# Patient Record
Sex: Male | Born: 1955 | Race: White | Hispanic: No | Marital: Married | Smoking: Former smoker
Health system: Southern US, Community
[De-identification: ages and names within clinical notes are randomized; demographics above are authoritative.]

## PROBLEM LIST (undated history)

## (undated) DIAGNOSIS — N400 Enlarged prostate without lower urinary tract symptoms: Secondary | ICD-10-CM

## (undated) DIAGNOSIS — E559 Vitamin D deficiency, unspecified: Secondary | ICD-10-CM

## (undated) DIAGNOSIS — I10 Essential (primary) hypertension: Secondary | ICD-10-CM

## (undated) DIAGNOSIS — Z86010 Personal history of colonic polyps: Secondary | ICD-10-CM

## (undated) DIAGNOSIS — I219 Acute myocardial infarction, unspecified: Secondary | ICD-10-CM

## (undated) DIAGNOSIS — G8929 Other chronic pain: Secondary | ICD-10-CM

## (undated) DIAGNOSIS — IMO0002 Reserved for concepts with insufficient information to code with codable children: Secondary | ICD-10-CM

## (undated) DIAGNOSIS — E785 Hyperlipidemia, unspecified: Secondary | ICD-10-CM

## (undated) DIAGNOSIS — M2540 Effusion, unspecified joint: Secondary | ICD-10-CM

## (undated) DIAGNOSIS — F329 Major depressive disorder, single episode, unspecified: Secondary | ICD-10-CM

## (undated) DIAGNOSIS — M549 Dorsalgia, unspecified: Secondary | ICD-10-CM

## (undated) DIAGNOSIS — F32A Depression, unspecified: Secondary | ICD-10-CM

## (undated) DIAGNOSIS — M2550 Pain in unspecified joint: Secondary | ICD-10-CM

## (undated) DIAGNOSIS — T884XXA Failed or difficult intubation, initial encounter: Secondary | ICD-10-CM

## (undated) DIAGNOSIS — Z87442 Personal history of urinary calculi: Secondary | ICD-10-CM

## (undated) HISTORY — PX: ACHILLES TENDON SURGERY: SHX542

## (undated) HISTORY — PX: ESOPHAGOGASTRODUODENOSCOPY: SHX1529

## (undated) HISTORY — PX: TONSILLECTOMY: SUR1361

## (undated) HISTORY — PX: ELBOW SURGERY: SHX618

## (undated) HISTORY — PX: COLONOSCOPY: SHX174

## (undated) HISTORY — PX: BACK SURGERY: SHX140

---

## 1997-01-06 DIAGNOSIS — I219 Acute myocardial infarction, unspecified: Secondary | ICD-10-CM

## 1997-01-06 HISTORY — DX: Acute myocardial infarction, unspecified: I21.9

## 2003-09-19 ENCOUNTER — Encounter
Admission: RE | Admit: 2003-09-19 | Discharge: 2003-11-15 | Payer: Self-pay | Admitting: Physical Medicine & Rehabilitation

## 2003-09-20 ENCOUNTER — Ambulatory Visit: Payer: Self-pay | Admitting: Physical Medicine & Rehabilitation

## 2003-11-15 ENCOUNTER — Encounter
Admission: RE | Admit: 2003-11-15 | Discharge: 2004-02-13 | Payer: Self-pay | Admitting: Physical Medicine & Rehabilitation

## 2004-01-07 HISTORY — PX: CARPAL TUNNEL RELEASE: SHX101

## 2004-01-12 ENCOUNTER — Ambulatory Visit: Payer: Self-pay | Admitting: Physical Medicine & Rehabilitation

## 2004-02-14 ENCOUNTER — Encounter
Admission: RE | Admit: 2004-02-14 | Discharge: 2004-05-13 | Payer: Self-pay | Admitting: Physical Medicine & Rehabilitation

## 2004-02-14 ENCOUNTER — Ambulatory Visit: Payer: Self-pay | Admitting: Physical Medicine & Rehabilitation

## 2004-05-13 ENCOUNTER — Encounter
Admission: RE | Admit: 2004-05-13 | Discharge: 2004-08-11 | Payer: Self-pay | Admitting: Physical Medicine & Rehabilitation

## 2006-10-30 ENCOUNTER — Encounter: Admission: RE | Admit: 2006-10-30 | Discharge: 2006-10-30 | Payer: Self-pay | Admitting: Orthopaedic Surgery

## 2006-11-13 ENCOUNTER — Encounter: Admission: RE | Admit: 2006-11-13 | Discharge: 2006-11-13 | Payer: Self-pay | Admitting: Orthopaedic Surgery

## 2007-01-29 ENCOUNTER — Encounter: Admission: RE | Admit: 2007-01-29 | Discharge: 2007-01-29 | Payer: Self-pay | Admitting: Orthopaedic Surgery

## 2007-05-14 ENCOUNTER — Ambulatory Visit (HOSPITAL_COMMUNITY): Admission: RE | Admit: 2007-05-14 | Discharge: 2007-05-15 | Payer: Self-pay | Admitting: Neurosurgery

## 2010-01-06 HISTORY — PX: JOINT REPLACEMENT: SHX530

## 2010-01-27 ENCOUNTER — Encounter: Payer: Self-pay | Admitting: Orthopaedic Surgery

## 2010-05-21 NOTE — Op Note (Signed)
[name], [name]               ACCOUNT NO.:  [id number]   MEDICAL RECORD NO.:  [id number]          PATIENT TYPE:  OIB   LOCATION:  3018                         FACILITY:  [facility]   PHYSICIAN:  Payton Doughty, M.D.      DATE OF BIRTH:  08/02/1955   DATE OF PROCEDURE:  05/14/2007  DATE OF DISCHARGE:                               OPERATIVE REPORT   PREOPERATIVE DIAGNOSIS:  Spondylosis and left L3 radiculopathy.   POSTOPERATIVE DIAGNOSIS:  Spondylosis and left L3 radiculopathy.   PROCEDURE:  L2-L3 laminotomy and foraminotomy done on left side.   DICTATING DOCTOR:  Payton Doughty, MD   ANESTHESIA:  General endotracheal.   PREP:  Prepped with alcohol wipe.   COMPLICATIONS:  None.   NURSE ASSISTANT:  Evon Slack   DOCTOR ASSISTANT:  Clydene Fake, MD   BODY TEXT:  This is a 55 year old gentleman with severe lumbar  spondylosis at L2-L3, taken to the operating roomsmoothly anesthetized  and intubated, placed prone on the operating table.  Following shave,  prep, and drape in usual sterile fashion, skin was infiltrated with 1%  lidocaine with 1:400,000 epinephrine.  Skin was incised from the bottom  level 1 to the bottom level 2 and the lamina of L2 was exposed on the  left side in subperiosteal plane.  Marker was placed.  An intraoperative  x-ray obtained and confirmed correct this level.  The marker was placed  over the pars interarticularis of what was believed to be L2.  This was  in fact shown to be the case on the x-ray.  The high-speed drill was  then used to create a semilaminectomy on the left side beyond the bottom  of ligament flavum that was removed in a retrograde fashion.  This  allowed mobilization of the L3 route as it rounded the pedicle.  The  central portion of the canal was also decompressed.  The anterior  epidural space was explored and found to be free.  The wound was  irrigated, hemostasis assured.  Depo-Medrol-soaked pad was placed in the  laminotomy  defect.  Successive layers of 0 Vicryl, 2-0 Vicryl, 3-0  Vicryl, and 3-0 nylon were used to close.  Betadine and Telfa dressing  was applied and made occlusive with OpSite, and the patient returned to  recovery room in good condition.           ______________________________  Payton Doughty, M.D.     MWR/MEDQ  D:  05/14/2007  T:  05/15/2007  Job:  [id number]

## 2010-05-21 NOTE — H&P (Signed)
[name], [name]               ACCOUNT NO.:  [id number]   MEDICAL RECORD NO.:  [id number]          PATIENT TYPE:  OIB   LOCATION:  3018                         FACILITY:  [facility]   PHYSICIAN:  Payton Doughty, M.D.      DATE OF BIRTH:  1955/08/01   DATE OF ADMISSION:  05/14/2007  DATE OF DISCHARGE:                              HISTORY & PHYSICAL   ADMISSION DIAGNOSES:  Spondylosis and spinal stenosis L2-3.   DICTATING DOCTOR:  Payton Doughty, MD.   BODY TEXT:  Mr. [name] is a 55 year old right-handed white gentleman 6-10  weeks since he has noticed increasing left-sided thigh pain and getting  down across top of his knee and not getting below his knee.  Pain is  free for about 10 minutes, gets worse and bothers him at night.  He has  had low back pain with the left leg as a major feature, the right leg  does not affected.   MEDICAL HISTORY:  Remarkable for obesity.  He has noted remarkable drop.  He has lost 160 pounds with diet and exercise.  He has had pneumonia in  the past, but recently had stress SRI.  Takes aspirin, etodolac, Lasix,  Toprol, Lipitor, lisinopril, and oxycodone.   ALLERGIES:  None.   SURGICAL HISTORY:  Carpal tunnel nerve release and a knee operation in  1985.   SOCIAL HISTORY:  He does not smoke or drink and he is a Merchandiser, retail in a  bakery.   FAMILY HISTORY:  Mom died at 72.  Dad died at 46.  Both had  hypertension.   REVIEW OF SYSTEMS:  Marked for glasses, hypertension,  hypercholesterolemia, back pain, and arthritis.  His HEENT exam is in  normal limits.  He has good range of motion of his neck.  Chest is  clear.  Cardiac exam is regular rate and rhythm.  Abdomen is somewhat  large but nontender with no hepatosplenomegaly.  Extremities, no  clubbing or cyanosis.  GU exam is deferred.  Peripheral pulses are good.  Neurologically, he is awake, alert, and oriented.  Cranial nerves are  intact.  Motor exam shows 5/5 strength throughout the upper and lower  extremities.  Sensory dysesthesias described in the left L3  distribution.  Reflexes are 2 at the knees, 1 at the left, and 2 at the  right.  Straight leg raise is negative.  He comes in with an MRI that  shows a spinal stenosis at L2-3 worse off the left.  There is  combination of disk and facet arthropathy.  There is fair amount of  edema in the L2-3 vertebral body.  It is strongly suggestive of  extensive degenerative change.   CLINICAL IMPRESSION:  Lumbar spondylosis and symptomatic neurogenic  claudication in the left L3 distribution.   PLANS:  Left L3 laminotomy and foraminotomy.  The risks and benefits  have been discussed with him and he wished to proceed.           ______________________________  Payton Doughty, M.D.     MWR/MEDQ  D:  05/14/2007  T:  05/15/2007  Job:  [id number]

## 2010-05-24 NOTE — Assessment & Plan Note (Signed)
MEDICAL RECORD NUMBER:  [id number].   DATE OF BIRTH:  [birth year].   The patient returns with complaints of ankle pain bilaterally.  We felt that  these were mostly due to weight and ankle positioning.  He did better with  ASOs and ties these tight, but still does not feel that he gets ultimate  immobilization of the joint with these.  After days of heavy walking or  activity, the following morning comes with a lot of ankle pain.  The patient  continues on his etocolac 400 mg b.i.d.  He has had no GI side effects up to  this point.  I do not believe he has purchased any new shoes since I last  saw him.  He is wearing high ankle boots today.  The patient rates his pain  generally at a 0-2/10 with a 1/10 pain noted generally on average.  The  patient asked questions today regarding other alternatives for his feet.   He told me last weekend that he worked hard in the garden and outside on  Friday and then Saturday woke up and could not put weight on the feet and  ankles due to pain.  It took him the rest of the morning really to get up.  He has had no recent x-rays.  He has never had MRIs of the feet done.  He  has really made no __________ in weight loss since I last saw him either.   SOCIAL HISTORY:  The patient continues to work full time.   REVIEW OF SYSTEMS:  The patient notes shortness of breath and swelling.  Occasional problems with sleep, but denies spasms, dizziness, anxiety,  fever, chills or bleeding.   PHYSICAL EXAMINATION:  The blood pressure is 154/78, the pulse is 78,  respiratory rate 16 and saturating 96% on room air.  The patient walks with  a limp to either side.  His affect is bright and appropriate.  His  appearance is well kept.  The patient continues to display some wear on the  lateral aspects of his soles.  He walks with supination in the feet.  In  addition to the varus deformity of the foot, he has valgus deformities at  the knees bilaterally.  Knee and  ankle strength seem well preserved.  The  patient is morbidly obese.  The pelvis was normal in position and height  essentially.  The heart was regular rate and rhythm.  The lungs were clear.  The abdomen was nontender.   ASSESSMENT:  1.  Bilateral ankle weakness due to supination of the foot and varus      deformity.  2.  Morbid obesity.  3.  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  4.  Left heel cord/calcaneal spur on the left side.   PLAN:  1.  We will send the patient for MRIs of his feet to rule out stress      fracture or any other obvious cause of his activity-related pain.  2.  We are limited in providing another device that will provide him      adequate ankle stabilization.  He seems to do best with his boots and I      would recommend that he continue to wear these plus or minus with the      ASOs.  I do not think that we want to go as far as an AFO due to  restrictions that it would cause.  I do not think that he would benefit      from a cast boot purely from an ankle stabilization standpoint due to      immobilization also.  We would consider a cast boot if there is some      type of stress fracture going on in the feet.  3.  The patient will continue with etocolac for any arthritic effects.  I      did warn him of side effect profile both from a stomach and renal      standpoint.  4.  The patient needs to continue with his shoe maintenance.  5.  The patient needs to get serious about his weight losses because this is      his ultimate problem here.  6.  I will see the patient back in about two months' time.       ZTS/MedQ  D:  11/20/2003 15:09:18  T:  11/20/2003 19:33:23  Job #:  [id number]   cc:   Lurena Joiner L. Jolinda Croak, M.D.  [address] [place]  [place] [address]  Fax: [phone]

## 2010-05-24 NOTE — Assessment & Plan Note (Signed)
MEDICAL RECORD NUMBER:  [id number].   [name] is here in followup of his bilateral foot and ankle pain. MRI  performed on January 4 revealed 2/3 thickness tear in the left Achilles  tendon anterior to posterior at the insertion upon the calculus. The patient  saw Dr. Cleophas Dunker for an orthopedic opinion. He felt that he was not a  surgical candidate at this point. He recommended conservative care at this  time unless symptoms should increase.   I talked to the patient at length about his symptoms, and they certainly  have been long standing. Pain really has not increased significantly over  the last two or three years. Pain is worse when he walks on uneven surfaces  and walks up heels. His pain still is in the range of 1-4/10 and generally  averages at a 2/10. Pain improves with rest, heat, and ice. He finds that  the Ultram and Tylenol help to a certain extent but no significantly when  pain increases.   SOCIAL HISTORY:  The patient continues to work full time. He is trying to  work on his diet. He states he lives nearby the [place].   REVIEW OF SYSTEMS:  The patient reports history of heart trouble but no  other new symptoms from a respiratory, neurological, psychiatric, or  genitourinary standpoint. Full review of systems section is in the health  and history portion of the chart.   PHYSICAL EXAMINATION:  Blood pressure is 133/73, pulse 67, respiratory rate  22. He is saturating 96% on room air. The patient walks with a slight limp  favoring the left side. Affect is bright and appropriate. Appearance is well  kept. His flexible in the left foot remains a bit diminished. He had pain  with palpation along the left calcaneus region near the Achilles insertion.  The patient had pain with forced ankle dorsi flexion as well. Otherwise,  motor and sensory exam was intact. The patient had normal skin and pulses  were intact.   ASSESSMENT:  1.  Bilateral ankle weakness and pain with partial  left Achilles tendon tear      at the insertion  upon the calcaneus. This injury is likely chronic. He      does not appear to be at acute risk for rupture.  2.  Morbid obesity.  3.  Osteoarthritis of the knees.   PLAN:  1.  The patient had good results with the steroid injection in January. We      will hold off on another injection at this point as I do not wish to      weaken the insertion of the tendon on the calcaneus. He still having      good results from the injection.  2.  The patient would like to become active with range of motion and      exercise. He likely will join a gym which I encouraged. We discussed      multiple stretching exercises, and he would benefit from a regular      stretching program to the Achilles tendon on both lower extremities.      Before activities, I recommended heat, stretching, and ice and      stretching afterwards.  3.  I wrote the patient a prescription for Percocet 2.5/325 for breakthrough      pain. He will likely use this sparingly.  4.  I will see him back in about three months' time.      ZTS/MedQ  D:  02/16/2004 12:22:57  T:  02/17/2004 07:14:54  Job #:  [id number]   cc:   [name] M.D. Ellen Henri

## 2010-05-24 NOTE — Assessment & Plan Note (Signed)
DATE OF VISIT:  January 12, 2004.   MEDICAL RECORD NUMBER:  [id number].   DATE OF BIRTH:  08/07/1955.   Mr. [name] is here in followup of his bilateral ankle and foot pain.  We  ordered MRIs at the last visit.  We actually did not get those until  yesterday for whatever reason.  They were done at [facility].  I do not have these available to me at his visit today.  The  patient decreased his etocolac down to 400 mg daily.  He has tried to wear  his boots religiously and it seems to have helped to a certain extent, but  worsens usually with his activity.  He really has not gone forward with any  new shoe wear or adaptations.  He has not lost any weight.  He is still  working full time.  He does wonder if he would benefit from occasion  breakthrough pain medication to help him continue on.  He rates his pain  currently at a 0-1/10.   SOCIAL HISTORY:  The patient is working full time for [employer].   REVIEW OF SYSTEMS:  The patient has a history of some heart attacks, but has  had no new problems recently.  Denies chest pain, wheezing, coughing,  spasms, confusion, problems with sleep, agitation, nausea, vomiting,  diarrhea, constipation or sweating.   PHYSICAL EXAMINATION:  The blood pressure is 136/62, the pulse is 78, the  respiratory rate is 20 and saturating 96% on room air.  The patient walks  wide-based gait.  Affect is bright and appropriate.  Appearance is well  kept.  He remains significantly obese.  We looked the feet today and he does  not have a dramatically flat or high arch.  The bilateral aspect of his foot  does seem to come a bit more medially than the average foot would.  He has  fair flexibility of the foot, although the foot compresses a great deal  under his weight.  The patient had pain with palpation along the posterior  calcaneus near the Achilles' tendon insertion area.  Range of motion was  fair.  The patient had pain with  forced ankle dorsiflexion.  Motor strength  was 5/5 in both lower extremities.  Sensory exam was grossly intact.  The  abdomen was obese.  Pulses were 2+.  The heart was regular rate and rhythm.  The chest was clear.   ASSESSMENT:  1.  Bilateral ankle weakness and pain due to supination of the foot and      varus deformity.  I do question whether that it truly is supinating when      he walks.  He may be rather be pronating the foot to a certain extent      with his significant weight.  2.  Morbid obesity.  3.  Left heel cord/calcaneal spur.  4.  Osteoarthritis of both knees.   PLAN:  1.  After informed consent, we injected the left heel spur/calcaneus with 40      mg of Kenalog and 2 mL of 1% lidocaine.  The patient tolerated this      well.  Post injection instructions were given.  2.  I would like to try a different type of shoe insert with better medial      arch support to see if this in fact improves his pain rather than      increasing it.  He needs to  keep up to date with the shoe as his current      insole was really worn thin today.  3.  Will decrease etodolac for arthritis to once a day if needed.  He was      made aware of some of the side and      complications associated with this type of medicine.  4.  I will see the patient back in about six weeks' time.  I will review his      MRI studies with him as needed.      Zach   ZTS/MedQ  D:  01/12/2004 14:53:44  T:  01/12/2004 17:16:38  Job #:  [id number]   cc:   Lurena Joiner L. Jolinda Croak, M.D.

## 2010-07-09 ENCOUNTER — Emergency Department (HOSPITAL_BASED_OUTPATIENT_CLINIC_OR_DEPARTMENT_OTHER)
Admission: EM | Admit: 2010-07-09 | Discharge: 2010-07-09 | Disposition: A | Discharge status: Home / Self Care | Payer: Worker's Compensation | Attending: Emergency Medicine | Admitting: Emergency Medicine

## 2010-07-09 ENCOUNTER — Emergency Department (INDEPENDENT_AMBULATORY_CARE_PROVIDER_SITE_OTHER): Payer: Worker's Compensation

## 2010-07-09 DIAGNOSIS — W108XXA Fall (on) (from) other stairs and steps, initial encounter: Secondary | ICD-10-CM | POA: Insufficient documentation

## 2010-07-09 DIAGNOSIS — M519 Unspecified thoracic, thoracolumbar and lumbosacral intervertebral disc disorder: Secondary | ICD-10-CM

## 2010-07-09 DIAGNOSIS — Z79899 Other long term (current) drug therapy: Secondary | ICD-10-CM | POA: Insufficient documentation

## 2010-07-09 DIAGNOSIS — I252 Old myocardial infarction: Secondary | ICD-10-CM | POA: Insufficient documentation

## 2010-07-09 DIAGNOSIS — M545 Low back pain, unspecified: Secondary | ICD-10-CM | POA: Insufficient documentation

## 2010-07-09 DIAGNOSIS — Y9289 Other specified places as the place of occurrence of the external cause: Secondary | ICD-10-CM | POA: Insufficient documentation

## 2010-07-09 DIAGNOSIS — E7800 Pure hypercholesterolemia, unspecified: Secondary | ICD-10-CM | POA: Insufficient documentation

## 2010-07-09 DIAGNOSIS — W19XXXA Unspecified fall, initial encounter: Secondary | ICD-10-CM

## 2010-07-09 DIAGNOSIS — G8929 Other chronic pain: Secondary | ICD-10-CM | POA: Insufficient documentation

## 2011-06-14 ENCOUNTER — Encounter (HOSPITAL_BASED_OUTPATIENT_CLINIC_OR_DEPARTMENT_OTHER): Payer: Self-pay | Admitting: Emergency Medicine

## 2011-06-14 ENCOUNTER — Emergency Department (HOSPITAL_BASED_OUTPATIENT_CLINIC_OR_DEPARTMENT_OTHER): Payer: BC Managed Care – PPO

## 2011-06-14 ENCOUNTER — Emergency Department (HOSPITAL_BASED_OUTPATIENT_CLINIC_OR_DEPARTMENT_OTHER)
Admission: EM | Admit: 2011-06-14 | Discharge: 2011-06-14 | Disposition: A | Discharge status: Home / Self Care | Payer: BC Managed Care – PPO | Attending: Emergency Medicine | Admitting: Emergency Medicine

## 2011-06-14 DIAGNOSIS — IMO0002 Reserved for concepts with insufficient information to code with codable children: Secondary | ICD-10-CM | POA: Insufficient documentation

## 2011-06-14 DIAGNOSIS — Z79899 Other long term (current) drug therapy: Secondary | ICD-10-CM | POA: Insufficient documentation

## 2011-06-14 DIAGNOSIS — I10 Essential (primary) hypertension: Secondary | ICD-10-CM | POA: Insufficient documentation

## 2011-06-14 DIAGNOSIS — R110 Nausea: Secondary | ICD-10-CM | POA: Insufficient documentation

## 2011-06-14 DIAGNOSIS — R5381 Other malaise: Secondary | ICD-10-CM | POA: Insufficient documentation

## 2011-06-14 DIAGNOSIS — I2510 Atherosclerotic heart disease of native coronary artery without angina pectoris: Secondary | ICD-10-CM | POA: Insufficient documentation

## 2011-06-14 DIAGNOSIS — R0602 Shortness of breath: Secondary | ICD-10-CM | POA: Insufficient documentation

## 2011-06-14 DIAGNOSIS — R531 Weakness: Secondary | ICD-10-CM

## 2011-06-14 DIAGNOSIS — R61 Generalized hyperhidrosis: Secondary | ICD-10-CM | POA: Insufficient documentation

## 2011-06-14 DIAGNOSIS — I252 Old myocardial infarction: Secondary | ICD-10-CM | POA: Insufficient documentation

## 2011-06-14 HISTORY — DX: Acute myocardial infarction, unspecified: I21.9

## 2011-06-14 HISTORY — DX: Dorsalgia, unspecified: M54.9

## 2011-06-14 HISTORY — DX: Other chronic pain: G89.29

## 2011-06-14 HISTORY — DX: Reserved for concepts with insufficient information to code with codable children: IMO0002

## 2011-06-14 LAB — BASIC METABOLIC PANEL
CO2: 26 mEq/L (ref 19–32)
Calcium: 9.6 mg/dL (ref 8.4–10.5)
GFR calc Af Amer: 77 mL/min — ABNORMAL LOW (ref >90)
Potassium: 4.3 mEq/L (ref 3.5–5.1)

## 2011-06-14 LAB — TROPONIN I
Troponin I: <0.3 ng/mL (ref <0.30)
Troponin I: <0.3 ng/mL (ref <0.30)

## 2011-06-14 LAB — URINALYSIS, ROUTINE W REFLEX MICROSCOPIC
Glucose, UA: NEGATIVE mg/dL
Ketones, ur: NEGATIVE mg/dL
Nitrite: NEGATIVE
Protein, ur: NEGATIVE mg/dL
pH: 7.5 (ref 5.0–8.0)

## 2011-06-14 LAB — CBC
MCH: 30.5 pg (ref 26.0–34.0)
MCHC: 34.5 g/dL (ref 30.0–36.0)
MCV: 88.5 fL (ref 78.0–100.0)
RBC: 5.31 MIL/uL (ref 4.22–5.81)
RDW: 14.3 % (ref 11.5–15.5)

## 2011-06-14 MED ORDER — ASPIRIN 325 MG PO TABS: 325.0000 mg | ORAL_TABLET | ORAL | Status: DC

## 2011-06-14 MED ORDER — SODIUM CHLORIDE 0.9 % IV BOLUS (SEPSIS)
1000.0000 mL | Freq: Once | INTRAVENOUS | Status: AC
  Administered 2011-06-14: 1000 mL via INTRAVENOUS

## 2011-06-14 MED ORDER — NITROGLYCERIN 0.4 MG SL SUBL
0.4000 mg | SUBLINGUAL_TABLET | SUBLINGUAL | Status: DC | PRN
  Filled 2011-06-14: qty 25

## 2011-06-14 NOTE — ED Provider Notes (Signed)
History  This chart was scribed for Cyndra Numbers, MD by Cherlynn Perches. The patient was seen in room MH09/MH09. Patient's care was started at 1535.  CSN: [id number]  Arrival date & time 06/14/11  1535   First MD Initiated Contact with Patient 06/14/11 1643      Chief Complaint  Patient presents with  . Weakness  . Shortness of Breath  . Nausea    (Consider location/radiation/quality/duration/timing/severity/associated sxs/prior treatment) HPI  [name] is a 56 y.o. male with a h/o of HTN, CAD, and MI who presents to the Emergency Department complaining of 2 days of gradually worsening, waxing and waning, moderate, generalized weakness with 1 day of associated diaphoresis, SOB, and nausea. Pt reports that he began feeling weak upon waking up two days ago. Pt states that weakness was improving until today. Today, pt reports that his weakness became worse and his other symptoms began. Pt states that the last time he felt this weak was when he had his last MI. At the time of his last MI, pt also had chest pain described as "pressure," which is not present today. Pt denies chest pain, vomiting, diarrhea, constipation, dysuria, hematuria, numbness, headache, and fever. Pt denies any known ill contacts.  Past Medical History  Diagnosis Date  . Degenerative disk disease   . Coronary artery disease   . Myocardial infarct   . Chronic back pain   . Chronic knee pain   . Morbid obesity     Past Surgical History  Procedure Date  . Revision total knee arthroplasty   . Elbow surgery   . Back surgery     History reviewed. No pertinent family history.  History  Substance Use Topics  . Smoking status: Never Smoker   . Smokeless tobacco: Never Used  . Alcohol Use: No      Review of Systems  Constitutional: Positive for diaphoresis. Negative for fever and chills.  HENT: Negative for ear pain and neck pain.   Eyes: Negative.   Respiratory: Positive for shortness of breath.  Negative for chest tightness.   Cardiovascular: Negative for chest pain.  Gastrointestinal: Positive for nausea. Negative for vomiting, diarrhea and constipation.  Genitourinary: Negative.  Negative for dysuria and hematuria.  Musculoskeletal: Negative.   Skin: Negative.   Neurological: Positive for weakness. Negative for numbness.  Hematological: Negative.   Psychiatric/Behavioral: Negative.   All other systems reviewed and are negative.    Allergies  Review of patient's allergies indicates no known allergies.  Home Medications   Current Outpatient Rx  Name Route Sig Dispense Refill  . ASPIRIN 325 MG PO TABS Oral Take 325 mg by mouth daily.    . ATORVASTATIN CALCIUM 40 MG PO TABS Oral Take 40 mg by mouth daily.    . BUPROPION HCL ER (XL) 300 MG PO TB24 Oral Take 300 mg by mouth daily.    . CHOLINE FENOFIBRATE 135 MG PO CPDR Oral Take 135 mg by mouth daily.    Marland Kitchen LISINOPRIL 20 MG PO TABS Oral Take 20 mg by mouth daily.    Marland Kitchen LORAZEPAM 0.5 MG PO TABS Oral Take 0.5 mg by mouth every 8 (eight) hours.    . NEBIVOLOL HCL 10 MG PO TABS Oral Take 10 mg by mouth daily.    . OXYCODONE HCL ER 10 MG PO TB12 Oral Take 10 mg by mouth every 12 (twelve) hours.    Marland Kitchen TADALAFIL 20 MG PO TABS Oral Take 20 mg by mouth daily as needed.    Marland Kitchen  TRAZODONE HCL 50 MG PO TABS Oral Take 50 mg by mouth at bedtime.    . TRIAMTERENE-HCTZ 75-50 MG PO TABS Oral Take 1 tablet by mouth daily.      BP 138/87  Pulse 72  Temp 99.1 F (37.3 C)  Resp 22  Ht 6\' 2"  (1.88 m)  Wt 383 lb (173.728 kg)  BMI 49.17 kg/m2  SpO2 100%  Physical Exam  Nursing note and vitals reviewed. Constitutional: He is oriented to person, place, and time. He appears well-developed and well-nourished.       Obese  HENT:  Head: Normocephalic and atraumatic.  Eyes: Conjunctivae and EOM are normal. No scleral icterus.  Neck: Normal range of motion. Neck supple. No JVD present.  Cardiovascular: Normal rate and regular rhythm.  Exam reveals  no gallop and no friction rub.   No murmur heard. Pulmonary/Chest: Effort normal and breath sounds normal. No respiratory distress. He has no wheezes. He has no rales.  Abdominal: He exhibits no distension. There is no tenderness. There is no rebound.  Musculoskeletal: Normal range of motion. He exhibits edema (trace peripheral edema bilaterally). He exhibits no tenderness.  Neurological: He is oriented to person, place, and time. Coordination normal.  Skin: No rash noted. No erythema.  Psychiatric: He has a normal mood and affect. His behavior is normal.    ED Course  Procedures (including critical care time)   Date: 06/14/2011  Rate: 73  Rhythm: normal sinus rhythm  QRS Axis: normal  Intervals: normal  ST/T Wave abnormalities: normal  Conduction Disutrbances:none  Narrative Interpretation:   Old EKG Reviewed: none available   DIAGNOSTIC STUDIES: Oxygen Saturation is 100% on O2, normal by my interpretation.    COORDINATION OF CARE: 4:50PM - Will give fluids for dehydration. Will also re-run labs after 3 hours. Pt agrees with plan.    Results for orders placed during the hospital encounter of 06/14/11  CBC      Component Value Range   WBC 7.1  4.0 - 10.5 (K/uL)   RBC 5.31  4.22 - 5.81 (MIL/uL)   Hemoglobin 16.2  13.0 - 17.0 (g/dL)   HCT 98.1  19.1 - 47.8 (%)   MCV 88.5  78.0 - 100.0 (fL)   MCH 30.5  26.0 - 34.0 (pg)   MCHC 34.5  30.0 - 36.0 (g/dL)   RDW 29.5  62.1 - 30.8 (%)   Platelets 214  150 - 400 (K/uL)  BASIC METABOLIC PANEL      Component Value Range   Sodium 138  135 - 145 (mEq/L)   Potassium 4.3  3.5 - 5.1 (mEq/L)   Chloride 104  96 - 112 (mEq/L)   CO2 26  19 - 32 (mEq/L)   Glucose, Bld 91  70 - 99 (mg/dL)   BUN 17  6 - 23 (mg/dL)   Creatinine, Ser 6.57  0.50 - 1.35 (mg/dL)   Calcium 9.6  8.4 - 84.6 (mg/dL)   GFR calc non Af Amer 66 (*) >90 (mL/min)   GFR calc Af Amer 77 (*) >90 (mL/min)  TROPONIN I      Component Value Range   Troponin I <0.30   <0.30 (ng/mL)  TROPONIN I      Component Value Range   Troponin I <0.30  <0.30 (ng/mL)  URINALYSIS, ROUTINE W REFLEX MICROSCOPIC      Component Value Range   Color, Urine YELLOW  YELLOW    APPearance CLOUDY (*) CLEAR    Specific Gravity, Urine 1.018  1.005 - 1.030    pH 7.5  5.0 - 8.0    Glucose, UA NEGATIVE  NEGATIVE (mg/dL)   Hgb urine dipstick NEGATIVE  NEGATIVE    Bilirubin Urine MODERATE (*) NEGATIVE    Ketones, ur NEGATIVE  NEGATIVE (mg/dL)   Protein, ur NEGATIVE  NEGATIVE (mg/dL)   Urobilinogen, UA 0.2  0.0 - 1.0 (mg/dL)   Nitrite NEGATIVE  NEGATIVE    Leukocytes, UA NEGATIVE  NEGATIVE    Dg Chest 2 View  06/14/2011  *RADIOLOGY REPORT*  Clinical Data: Weakness  CHEST - 2 VIEW  Comparison: None  Findings: Cardiomediastinal silhouette is unremarkable.  No acute infiltrate or pleural effusion.  No pulmonary edema.  Mild degenerative changes thoracic spine.  IMPRESSION: No active disease.  Mild degenerative changes thoracic spine.  Original Report Authenticated By: Natasha Mead, M.D.      1. Weakness generalized       MDM  Patient was evaluated by myself. Based on presentation patient had workup for possible ACS given history of coronary artery disease and weakness. CBC showed no anemia or leukocytosis and renal function was intact with no significant dehydration. Patient was given a liter of normal saline IV bolus as he does describe having some lightheadedness earlier today when he was having some nausea and diaphoresis. Patient had none of these symptoms here. He reported only some weakness. This was generalized and in no way focal. I had a concern for CVA. EKG was unremarkable as was chest x-ray. A three-hour troponin was also within normal limits. While here patient did receive aspirin. He had no pain whatsoever and vital signs remained stable. Patient was advised to followup with his primary care physician on Monday or to return if he had any changes in his symptoms. Patient's  complete metabolic panel did note elevated bilirubin the patient had no jaundice, abdominal pain, pruritus, nausea, or vomiting at this time. He had absolutely no belly pain. Urinalysis also was unremarkable. Patient was discharged in good condition with understanding of instructions.      I personally performed the services described in this documentation, which was scribed in my presence. The recorded information has been reviewed and considered.      Cyndra Numbers, MD 06/14/11 [phone]

## 2011-06-14 NOTE — ED Notes (Signed)
Pt states he has been feeling weak x 2 days.  Some shortness of breath, diaphoresis and some nausea today.  Denies any discomfort.

## 2011-06-14 NOTE — Discharge Instructions (Signed)
Fatigue Fatigue is a feeling of tiredness, lack of energy, lack of motivation, or feeling tired all the time. Having enough rest, good nutrition, and reducing stress will normally reduce fatigue. Consult your caregiver if it persists. The nature of your fatigue will help your caregiver to find out its cause. The treatment is based on the cause.  CAUSES  There are many causes for fatigue. Most of the time, fatigue can be traced to one or more of your habits or routines. Most causes fit into one or more of three general areas. They are: Lifestyle problems  Sleep disturbances.   Overwork.   Physical exertion.   Unhealthy habits.   Poor eating habits or eating disorders.   Alcohol and/or drug use .   Lack of proper nutrition (malnutrition).  Psychological problems  Stress and/or anxiety problems.   Depression.   Grief.   Boredom.  Medical Problems or Conditions  Anemia.   Pregnancy.   Thyroid gland problems.   Recovery from major surgery.   Continuous pain.   Emphysema or asthma that is not well controlled   Allergic conditions.   Diabetes.   Infections (such as mononucleosis).   Obesity.   Sleep disorders, such as sleep apnea.   Heart failure or other heart-related problems.   Cancer.   Kidney disease.   Liver disease.   Effects of certain medicines such as antihistamines, cough and cold remedies, prescription pain medicines, heart and blood pressure medicines, drugs used for treatment of cancer, and some antidepressants.  SYMPTOMS  The symptoms of fatigue include:   Lack of energy.   Lack of drive (motivation).   Drowsiness.   Feeling of indifference to the surroundings.  DIAGNOSIS  The details of how you feel help guide your caregiver in finding out what is causing the fatigue. You will be asked about your present and past health condition. It is important to review all medicines that you take, including prescription and non-prescription items. A  thorough exam will be done. You will be questioned about your feelings, habits, and normal lifestyle. Your caregiver may suggest blood tests, urine tests, or other tests to look for common medical causes of fatigue.  TREATMENT  Fatigue is treated by correcting the underlying cause. For example, if you have continuous pain or depression, treating these causes will improve how you feel. Similarly, adjusting the dose of certain medicines will help in reducing fatigue.  HOME CARE INSTRUCTIONS   Try to get the required amount of good sleep every night.   Eat a healthy and nutritious diet, and drink enough water throughout the day.   Practice ways of relaxing (including yoga or meditation).   Exercise regularly.   Make plans to change situations that cause stress. Act on those plans so that stresses decrease over time. Keep your work and personal routine reasonable.   Avoid street drugs and minimize use of alcohol.   Start taking a daily multivitamin after consulting your caregiver.  SEEK MEDICAL CARE IF:   You have persistent tiredness, which cannot be accounted for.   You have fever.   You have unintentional weight loss.   You have headaches.   You have disturbed sleep throughout the night.   You are feeling sad.   You have constipation.   You have dry skin.   You have gained weight.   You are taking any new or different medicines that you suspect are causing fatigue.   You are unable to sleep at night.     You develop any unusual swelling of your legs or other parts of your body.  SEEK IMMEDIATE MEDICAL CARE IF:   You are feeling confused.   Your vision is blurred.   You feel faint or pass out.   You develop severe headache.   You develop severe abdominal, pelvic, or back pain.   You develop chest pain, shortness of breath, or an irregular or fast heartbeat.   You are unable to pass a normal amount of urine.   You develop abnormal bleeding such as bleeding from  the rectum or you vomit blood.   You have thoughts about harming yourself or committing suicide.   You are worried that you might harm someone else.  MAKE SURE YOU:   Understand these instructions.   Will watch your condition.   Will get help right away if you are not doing well or get worse.  Document Released: 10/20/2006 Document Revised: 12/12/2010 Document Reviewed: 10/20/2006 [facility] Patient Information 2012 [place], [place].Weakness, Generalized Without Cause Your caregiver has seen you today because you are having problems with feelings of weakness. Weakness has many different causes, some of which are common and others are very rare. The causes of weakness are so numerous they could not all be listed on this page. The exam and other tests done today do not reveal a specific cause for the weakness that is an immediate danger or something that is treatable. Your caregiver has checked you for the most common causes of weakness and feels it is safe for you to go home and be observed. HOME CARE INSTRUCTIONS   For the time being, obtain more rest if needed.   Eat a well balanced diet.   Try to get at least some exercise every day in spite of how difficult it may seem at times. In the case of the elderly, exercise is especially important. As we grow older, there is a loss of muscle mass. Generally, there is also a loss of, or decrease in, activity that comes naturally with the aging process. Exercise and increased activities are the only tools we have to combat this natural process.   The results of some tests ordered today may not be available right away. You will be contacted with those results when they become available.   It is important to follow through with your physician as per instructions that you may have received today.  SEEK MEDICAL CARE IF:   You have any new concerns which you do not feel were dealt with today.   The weakness seems to be getting progressively worse.    You develop new or unusual aches or pains.  SEEK IMMEDIATE MEDICAL CARE IF:   You are unable to tend to your usual daily activities such as simply getting dressed, feeding yourself, or keeping up with your personal hygiene.   You develop inability to walk stairs or perform your usual daily activities.   You develop shortness of breath, chest pain, have difficulty moving parts of your body, or develop new problems for which you have not talked to your caregiver.   You experience difficulty speaking or swallowing.   You develop loss of control of bladder or bowels that was not present before.  Document Released: 12/23/2004 Document Revised: 12/12/2010 Document Reviewed: 06/04/2006 [facility] Patient Information 2012 [place], [place].

## 2012-01-07 HISTORY — PX: SHOULDER ARTHROSCOPY WITH ROTATOR CUFF REPAIR: SHX5685

## 2012-11-11 ENCOUNTER — Other Ambulatory Visit: Payer: Self-pay | Admitting: Neurosurgery

## 2012-11-11 DIAGNOSIS — M545 Low back pain, unspecified: Secondary | ICD-10-CM

## 2012-12-07 ENCOUNTER — Encounter (HOSPITAL_COMMUNITY): Payer: Self-pay | Admitting: Pharmacy Technician

## 2012-12-08 ENCOUNTER — Other Ambulatory Visit: Payer: Self-pay | Admitting: Neurosurgery

## 2012-12-09 ENCOUNTER — Encounter (HOSPITAL_COMMUNITY): Payer: Self-pay

## 2012-12-09 ENCOUNTER — Ambulatory Visit (HOSPITAL_COMMUNITY)
Admission: RE | Admit: 2012-12-09 | Discharge: 2012-12-09 | Disposition: A | Discharge status: Home / Self Care | Payer: BC Managed Care – PPO | Source: Ambulatory Visit | Attending: Anesthesiology | Admitting: Anesthesiology

## 2012-12-09 ENCOUNTER — Encounter (HOSPITAL_COMMUNITY)
Admission: RE | Admit: 2012-12-09 | Discharge: 2012-12-09 | Disposition: A | Discharge status: Home / Self Care | Payer: BC Managed Care – PPO | Source: Ambulatory Visit | Attending: Neurosurgery | Admitting: Neurosurgery

## 2012-12-09 DIAGNOSIS — I658 Occlusion and stenosis of other precerebral arteries: Secondary | ICD-10-CM | POA: Insufficient documentation

## 2012-12-09 DIAGNOSIS — I6529 Occlusion and stenosis of unspecified carotid artery: Secondary | ICD-10-CM | POA: Insufficient documentation

## 2012-12-09 DIAGNOSIS — I2510 Atherosclerotic heart disease of native coronary artery without angina pectoris: Secondary | ICD-10-CM | POA: Insufficient documentation

## 2012-12-09 DIAGNOSIS — Z0183 Encounter for blood typing: Secondary | ICD-10-CM | POA: Insufficient documentation

## 2012-12-09 DIAGNOSIS — I10 Essential (primary) hypertension: Secondary | ICD-10-CM | POA: Insufficient documentation

## 2012-12-09 DIAGNOSIS — Z01818 Encounter for other preprocedural examination: Secondary | ICD-10-CM | POA: Insufficient documentation

## 2012-12-09 DIAGNOSIS — Z6841 Body Mass Index (BMI) 40.0 and over, adult: Secondary | ICD-10-CM | POA: Insufficient documentation

## 2012-12-09 DIAGNOSIS — Z01812 Encounter for preprocedural laboratory examination: Secondary | ICD-10-CM | POA: Insufficient documentation

## 2012-12-09 DIAGNOSIS — Z01810 Encounter for preprocedural cardiovascular examination: Secondary | ICD-10-CM | POA: Insufficient documentation

## 2012-12-09 HISTORY — DX: Major depressive disorder, single episode, unspecified: F32.9

## 2012-12-09 HISTORY — DX: Depression, unspecified: F32.A

## 2012-12-09 HISTORY — DX: Essential (primary) hypertension: I10

## 2012-12-09 HISTORY — DX: Failed or difficult intubation, initial encounter: T88.4XXA

## 2012-12-09 LAB — ABO/RH: ABO/RH(D): O POS

## 2012-12-09 LAB — BASIC METABOLIC PANEL
BUN: 30 mg/dL — ABNORMAL HIGH (ref 6–23)
CO2: 24 mEq/L (ref 19–32)
Glucose, Bld: 114 mg/dL — ABNORMAL HIGH (ref 70–99)
Potassium: 4.7 mEq/L (ref 3.5–5.1)
Sodium: 139 mEq/L (ref 135–145)

## 2012-12-09 LAB — CBC
Hemoglobin: 17.4 g/dL — ABNORMAL HIGH (ref 13.0–17.0)
MCH: 30.8 pg (ref 26.0–34.0)
MCV: 89.4 fL (ref 78.0–100.0)
RBC: 5.65 MIL/uL (ref 4.22–5.81)

## 2012-12-09 NOTE — Progress Notes (Signed)
Notified Erie Noe that orders need to be signed, patient has a 1200 PAT appoinment

## 2012-12-09 NOTE — Progress Notes (Signed)
Anesthesia Chart Review:  Patient is a 57 year old male scheduled for two level PLIF on 12/13/12 by Dr. Wynetta Emery. I evaluated him during his PAT visit yesterday.  History includes former smoker, morbid obesity (BMI 52.33), CAD/MI in '99 treated with thrombolysis in [place], [place], HTN, depression, DDD, chronic back and knee pain, left L2-3 laminotomy and foraminotomy 05/14/07.  Anesthesia history: DIFFICULT INTUBATION on 05/14/07 complicated by a chipped tooth and post-operative sore throat.  He has had multiple surgeries since and denies a history of an awake intubation.  I have requested anesthesia records from 2012 TKA at [facility] and 2013 shoulder surgery at [facility]. (Update: [facility] HIM could not locate second "post-anesthesia" page to his 05/14/07 anesthesia record but notes indicate it took three attempts and unable to visualize vocal cords.  It appears a Miller III with stylet was used to pass a 7.5 ETT. [facility] anesthesia record from 11/05/10 showed a known difficult intubation due to anterior larynx and small mouth.  He was successfully intubated using glidescope #3 with stylet to place a 7.5 ETT.)  EKG on 12/09/12 showed NSR. Cardiologist is Dr. Lollie Marrow with [facility].    Echo on 12/23/11 showed LV size, wall thickness and systolic function were normal, overall LV systolic function is normal with EF 60-65%.  Mild AV sclerosis without stenosis, no AR.  Nuclear stress test on 12/23/11 showed normal myocardial perfusion imaging, overall LV systolic functio was normal without regional wall motion abnormalities, LVEF 57%.  Carotid ultrasound on 04/05/12 showed mild 0-39% bilateral ICA stenosis.   CXR on 12/09/12 showed no active cardiopulmonary disease.  Preoperative labs noted.  Exam show a large, Caucasian male in NAD. There is decreased space between his tongue and upper teeth.  Neck is large.  Heart RRR, no significant murmur noted.  Lungs diminished but clear.    He has had a  stress and echo within the past year.  He denied any new CV symptoms.  I anticipate that he can proceed as planned.  His past anesthesia records arrived after he had left his appointment, but I did speak with him about probable need for advanced equipment such as a glidescope and less likely an awake intubation since this was not required in the past.  He understand the definitive anesthesia plan will be discussed with him on the day of surgery by his assigned anesthesiologist.  Shonna Chock, PA-C [facility] Short Stay Center/Anesthesiology Phone [phone] 12/10/2012 11:03 AM

## 2012-12-09 NOTE — Pre-Procedure Instructions (Signed)
Laderrick Wilk  12/09/2012   Your procedure is scheduled on:  Monday December 8 th at 1346 PM  Report to [facility] Main Entrance "A" at 1164 AM.  Call this number if you have problems the morning of surgery: [phone]   Remember:   Do not eat food or drink liquids after midnight.   Take these medicines the morning of surgery with A SIP OF WATER: Wellbutrin, Ativan if needed And Bystolic  Do not wear jewelry, make-up or nail polish.  Do not wear lotions, powders, or perfumes. You may wear deodorant.  Do not shave 48 hours prior to surgery. Men may shave face and neck.  Do not bring valuables to the hospital.  [facility] is not responsible  for any belongings or valuables.               Contacts, dentures or bridgework may not be worn into surgery.  Leave suitcase in the car. After surgery it may be brought to your room.  For patients admitted to the hospital, discharge time is determined by your treatment team.               Patients discharged the day of surgery will not be allowed to drive home.    Special Instructions: Shower using CHG 2 nights before surgery and the night before surgery.  If you shower the day of surgery use CHG.  Use special wash - you have one bottle of CHG for all showers.  You should use approximately 1/3 of the bottle for each shower.   Please read over the following fact sheets that you were given: Pain Booklet, Coughing and Deep Breathing, Blood Transfusion Information, MRSA Information and Surgical Site Infection Prevention

## 2012-12-10 ENCOUNTER — Encounter (HOSPITAL_COMMUNITY): Payer: Self-pay

## 2012-12-13 ENCOUNTER — Inpatient Hospital Stay (HOSPITAL_COMMUNITY): Payer: Worker's Compensation | Admitting: Certified Registered"

## 2012-12-13 ENCOUNTER — Encounter (HOSPITAL_COMMUNITY): Payer: Self-pay | Admitting: *Deleted

## 2012-12-13 ENCOUNTER — Encounter (HOSPITAL_COMMUNITY): Payer: Worker's Compensation | Admitting: Vascular Surgery

## 2012-12-13 ENCOUNTER — Inpatient Hospital Stay (HOSPITAL_COMMUNITY)
Admission: RE | Admit: 2012-12-13 | Discharge: 2012-12-17 | DRG: 519 | Disposition: A | Discharge status: Skilled Nursing Facility | Payer: Worker's Compensation | Source: Ambulatory Visit | Attending: Neurosurgery | Admitting: Neurosurgery

## 2012-12-13 ENCOUNTER — Encounter (HOSPITAL_COMMUNITY)
Admission: RE | Disposition: A | Discharge status: Skilled Nursing Facility | Payer: BC Managed Care – PPO | Source: Ambulatory Visit | Attending: Neurosurgery

## 2012-12-13 ENCOUNTER — Inpatient Hospital Stay (HOSPITAL_COMMUNITY): Payer: Worker's Compensation

## 2012-12-13 DIAGNOSIS — I252 Old myocardial infarction: Secondary | ICD-10-CM

## 2012-12-13 DIAGNOSIS — Q762 Congenital spondylolisthesis: Secondary | ICD-10-CM

## 2012-12-13 DIAGNOSIS — M47817 Spondylosis without myelopathy or radiculopathy, lumbosacral region: Secondary | ICD-10-CM | POA: Diagnosis present

## 2012-12-13 DIAGNOSIS — M25569 Pain in unspecified knee: Secondary | ICD-10-CM | POA: Diagnosis present

## 2012-12-13 DIAGNOSIS — M51379 Other intervertebral disc degeneration, lumbosacral region without mention of lumbar back pain or lower extremity pain: Principal | ICD-10-CM | POA: Diagnosis present

## 2012-12-13 DIAGNOSIS — Z87891 Personal history of nicotine dependence: Secondary | ICD-10-CM

## 2012-12-13 DIAGNOSIS — Z79899 Other long term (current) drug therapy: Secondary | ICD-10-CM

## 2012-12-13 DIAGNOSIS — M549 Dorsalgia, unspecified: Secondary | ICD-10-CM | POA: Diagnosis present

## 2012-12-13 DIAGNOSIS — Z96659 Presence of unspecified artificial knee joint: Secondary | ICD-10-CM

## 2012-12-13 DIAGNOSIS — M4180 Other forms of scoliosis, site unspecified: Secondary | ICD-10-CM | POA: Diagnosis present

## 2012-12-13 DIAGNOSIS — G8929 Other chronic pain: Secondary | ICD-10-CM | POA: Diagnosis present

## 2012-12-13 DIAGNOSIS — F3289 Other specified depressive episodes: Secondary | ICD-10-CM | POA: Diagnosis present

## 2012-12-13 DIAGNOSIS — F329 Major depressive disorder, single episode, unspecified: Secondary | ICD-10-CM | POA: Diagnosis present

## 2012-12-13 DIAGNOSIS — I10 Essential (primary) hypertension: Secondary | ICD-10-CM | POA: Diagnosis present

## 2012-12-13 DIAGNOSIS — Z7982 Long term (current) use of aspirin: Secondary | ICD-10-CM

## 2012-12-13 DIAGNOSIS — Z6841 Body Mass Index (BMI) 40.0 and over, adult: Secondary | ICD-10-CM

## 2012-12-13 DIAGNOSIS — I2510 Atherosclerotic heart disease of native coronary artery without angina pectoris: Secondary | ICD-10-CM | POA: Diagnosis present

## 2012-12-13 DIAGNOSIS — M5137 Other intervertebral disc degeneration, lumbosacral region: Principal | ICD-10-CM | POA: Diagnosis present

## 2012-12-13 DIAGNOSIS — W108XXA Fall (on) (from) other stairs and steps, initial encounter: Secondary | ICD-10-CM | POA: Diagnosis present

## 2012-12-13 DIAGNOSIS — M48061 Spinal stenosis, lumbar region without neurogenic claudication: Secondary | ICD-10-CM | POA: Diagnosis present

## 2012-12-13 SURGERY — POSTERIOR LUMBAR FUSION 2 LEVEL
Anesthesia: General | Site: Back

## 2012-12-13 MED ORDER — SUCCINYLCHOLINE CHLORIDE 20 MG/ML IJ SOLN
INTRAMUSCULAR | Status: DC | PRN
Start: 2012-12-13 — End: 2012-12-13
  Administered 2012-12-13: 100 mg via INTRAVENOUS

## 2012-12-13 MED ORDER — ROCURONIUM BROMIDE 100 MG/10ML IV SOLN
INTRAVENOUS | Status: DC | PRN
  Administered 2012-12-13: 50 mg via INTRAVENOUS
  Administered 2012-12-13: 10 mg via INTRAVENOUS
  Administered 2012-12-13 (×2): 20 mg via INTRAVENOUS

## 2012-12-13 MED ORDER — DEXTROSE 5 % IV SOLN
3.0000 g | INTRAVENOUS | Status: DC | PRN
  Administered 2012-12-13: 3 g via INTRAVENOUS

## 2012-12-13 MED ORDER — PHENOL 1.4 % MT LIQD: 1.0000 | OROMUCOSAL | Status: DC | PRN

## 2012-12-13 MED ORDER — CEFAZOLIN SODIUM 1-5 GM-% IV SOLN
1.0000 g | INTRAVENOUS | Status: DC
  Filled 2012-12-13: qty 50

## 2012-12-13 MED ORDER — TRIAMTERENE-HCTZ 75-50 MG PO TABS
1.0000 | ORAL_TABLET | Freq: Every day | ORAL | Status: DC
  Administered 2012-12-14 – 2012-12-17 (×4): 1 via ORAL
  Filled 2012-12-13 (×4): qty 1

## 2012-12-13 MED ORDER — LIDOCAINE HCL 4 % MT SOLN
OROMUCOSAL | Status: DC | PRN
  Administered 2012-12-13: 3 mL via TOPICAL

## 2012-12-13 MED ORDER — ONDANSETRON HCL 4 MG/2ML IJ SOLN: 4.0000 mg | Freq: Four times a day (QID) | INTRAMUSCULAR | Status: DC | PRN

## 2012-12-13 MED ORDER — FENOFIBRATE 54 MG PO TABS
54.0000 mg | ORAL_TABLET | Freq: Every day | ORAL | Status: DC
  Administered 2012-12-14 – 2012-12-17 (×4): 54 mg via ORAL
  Filled 2012-12-13 (×4): qty 1

## 2012-12-13 MED ORDER — SODIUM CHLORIDE 0.9 % IR SOLN
Status: DC | PRN
  Administered 2012-12-13: 15:00:00

## 2012-12-13 MED ORDER — ATORVASTATIN CALCIUM 40 MG PO TABS
40.0000 mg | ORAL_TABLET | Freq: Every day | ORAL | Status: DC
  Administered 2012-12-14 – 2012-12-17 (×4): 40 mg via ORAL
  Filled 2012-12-13 (×4): qty 1

## 2012-12-13 MED ORDER — VITAMIN D (ERGOCALCIFEROL) 1.25 MG (50000 UNIT) PO CAPS
50000.0000 [IU] | ORAL_CAPSULE | ORAL | Status: DC
  Administered 2012-12-13: 50000 [IU] via ORAL
  Filled 2012-12-13: qty 1

## 2012-12-13 MED ORDER — OXYCODONE HCL ER 10 MG PO T12A
20.0000 mg | EXTENDED_RELEASE_TABLET | Freq: Two times a day (BID) | ORAL | Status: DC
  Administered 2012-12-13 – 2012-12-17 (×8): 20 mg via ORAL
  Filled 2012-12-13 (×8): qty 2

## 2012-12-13 MED ORDER — TRAZODONE HCL 50 MG PO TABS
50.0000 mg | ORAL_TABLET | Freq: Every day | ORAL | Status: DC
  Administered 2012-12-15: 50 mg via ORAL
  Filled 2012-12-13 (×5): qty 1

## 2012-12-13 MED ORDER — NEOSTIGMINE METHYLSULFATE 1 MG/ML IJ SOLN
INTRAMUSCULAR | Status: DC | PRN
  Administered 2012-12-13: 3 mg via INTRAVENOUS

## 2012-12-13 MED ORDER — CYCLOBENZAPRINE HCL 10 MG PO TABS
ORAL_TABLET | ORAL | Status: AC
  Administered 2012-12-13: 10 mg
  Filled 2012-12-13: qty 1

## 2012-12-13 MED ORDER — DIPHENHYDRAMINE HCL 12.5 MG/5ML PO ELIX: 12.5000 mg | ORAL_SOLUTION | Freq: Four times a day (QID) | ORAL | Status: DC | PRN

## 2012-12-13 MED ORDER — HYDROMORPHONE 0.3 MG/ML IV SOLN
INTRAVENOUS | Status: DC
  Administered 2012-12-13: 2.3 mg via INTRAVENOUS
  Administered 2012-12-14: 08:00:00 via INTRAVENOUS
  Administered 2012-12-14: 2.7 mg via INTRAVENOUS
  Administered 2012-12-14: 0.6 mg via INTRAVENOUS
  Administered 2012-12-14: 5.7 mg via INTRAVENOUS
  Administered 2012-12-14: 0.3 mg via INTRAVENOUS
  Administered 2012-12-14: 1.2 mg via INTRAVENOUS
  Administered 2012-12-14: 21:00:00 via INTRAVENOUS
  Administered 2012-12-15: 1.2 mg via INTRAVENOUS
  Administered 2012-12-15: 2.98 mg via INTRAVENOUS
  Administered 2012-12-15: 6.6 mg via INTRAVENOUS
  Administered 2012-12-15: 05:00:00 via INTRAVENOUS
  Administered 2012-12-15: 0.9 mg via INTRAVENOUS
  Administered 2012-12-16: 1.5 mg via INTRAVENOUS
  Administered 2012-12-16: 7.5 mg via INTRAVENOUS
  Administered 2012-12-16: 1.5 mg via INTRAVENOUS
  Filled 2012-12-13 (×4): qty 25

## 2012-12-13 MED ORDER — ALBUMIN HUMAN 5 % IV SOLN
INTRAVENOUS | Status: DC | PRN
  Administered 2012-12-13 (×2): via INTRAVENOUS

## 2012-12-13 MED ORDER — SODIUM CHLORIDE 0.9 % IV SOLN: 250.0000 mL | INTRAVENOUS | Status: DC

## 2012-12-13 MED ORDER — DOCUSATE SODIUM 100 MG PO CAPS
100.0000 mg | ORAL_CAPSULE | Freq: Two times a day (BID) | ORAL | Status: DC
  Administered 2012-12-13 – 2012-12-17 (×8): 100 mg via ORAL
  Filled 2012-12-13 (×8): qty 1

## 2012-12-13 MED ORDER — VECURONIUM BROMIDE 10 MG IV SOLR
INTRAVENOUS | Status: DC | PRN
  Administered 2012-12-13 (×11): 1 mg via INTRAVENOUS
  Administered 2012-12-13: 2 mg via INTRAVENOUS

## 2012-12-13 MED ORDER — ONDANSETRON HCL 4 MG/2ML IJ SOLN
INTRAMUSCULAR | Status: DC | PRN
  Administered 2012-12-13: 4 mg via INTRAVENOUS

## 2012-12-13 MED ORDER — ETODOLAC 400 MG PO TABS
400.0000 mg | ORAL_TABLET | Freq: Two times a day (BID) | ORAL | Status: DC
  Administered 2012-12-13 – 2012-12-17 (×8): 400 mg via ORAL
  Filled 2012-12-13 (×9): qty 1

## 2012-12-13 MED ORDER — CYCLOBENZAPRINE HCL 10 MG PO TABS: 10.0000 mg | ORAL_TABLET | Freq: Three times a day (TID) | ORAL | Status: DC | PRN

## 2012-12-13 MED ORDER — SODIUM CHLORIDE 0.9 % IJ SOLN: 3.0000 mL | INTRAMUSCULAR | Status: DC | PRN

## 2012-12-13 MED ORDER — LORAZEPAM 0.5 MG PO TABS: 0.5000 mg | ORAL_TABLET | ORAL | Status: DC | PRN

## 2012-12-13 MED ORDER — ACETAMINOPHEN 650 MG RE SUPP: 650.0000 mg | RECTAL | Status: DC | PRN

## 2012-12-13 MED ORDER — HYDROMORPHONE HCL PF 1 MG/ML IJ SOLN
0.2500 mg | INTRAMUSCULAR | Status: DC | PRN
  Administered 2012-12-13: 21:00:00 via INTRAVENOUS
  Administered 2012-12-13: 1 mg via INTRAVENOUS

## 2012-12-13 MED ORDER — NEBIVOLOL HCL 5 MG PO TABS
5.0000 mg | ORAL_TABLET | Freq: Every day | ORAL | Status: DC
  Administered 2012-12-15 – 2012-12-17 (×3): 5 mg via ORAL
  Filled 2012-12-13 (×4): qty 1

## 2012-12-13 MED ORDER — ALUM & MAG HYDROXIDE-SIMETH 200-200-20 MG/5ML PO SUSP: 30.0000 mL | Freq: Four times a day (QID) | ORAL | Status: DC | PRN

## 2012-12-13 MED ORDER — LISINOPRIL 20 MG PO TABS
20.0000 mg | ORAL_TABLET | Freq: Every day | ORAL | Status: DC
  Administered 2012-12-15 – 2012-12-17 (×3): 20 mg via ORAL
  Filled 2012-12-13 (×4): qty 1

## 2012-12-13 MED ORDER — DIPHENHYDRAMINE HCL 50 MG/ML IJ SOLN: 12.5000 mg | Freq: Four times a day (QID) | INTRAMUSCULAR | Status: DC | PRN

## 2012-12-13 MED ORDER — HYDROMORPHONE HCL PF 1 MG/ML IJ SOLN
INTRAMUSCULAR | Status: AC
  Filled 2012-12-13: qty 1

## 2012-12-13 MED ORDER — HYDROMORPHONE 0.3 MG/ML IV SOLN
INTRAVENOUS | Status: AC
  Administered 2012-12-13: 21:00:00
  Filled 2012-12-13: qty 25

## 2012-12-13 MED ORDER — LIDOCAINE-EPINEPHRINE 1 %-1:100000 IJ SOLN
INTRAMUSCULAR | Status: DC | PRN
  Administered 2012-12-13: 10 mL

## 2012-12-13 MED ORDER — MIDAZOLAM HCL 5 MG/5ML IJ SOLN
INTRAMUSCULAR | Status: DC | PRN
  Administered 2012-12-13: 2 mg via INTRAVENOUS

## 2012-12-13 MED ORDER — DEXTROSE 5 % IV SOLN
3.0000 g | INTRAVENOUS | Status: DC
  Filled 2012-12-13 (×2): qty 3000

## 2012-12-13 MED ORDER — ASPIRIN 325 MG PO TABS
325.0000 mg | ORAL_TABLET | Freq: Every day | ORAL | Status: DC
  Administered 2012-12-14 – 2012-12-17 (×4): 325 mg via ORAL
  Filled 2012-12-13 (×4): qty 1

## 2012-12-13 MED ORDER — DEPLIN 15 MG PO TABS: 15.0000 mg | ORAL_TABLET | Freq: Every day | ORAL | Status: DC

## 2012-12-13 MED ORDER — ACETAMINOPHEN 325 MG PO TABS: 650.0000 mg | ORAL_TABLET | ORAL | Status: DC | PRN

## 2012-12-13 MED ORDER — DEXTROSE 5 % IV SOLN
3.0000 g | Freq: Three times a day (TID) | INTRAVENOUS | Status: DC
  Administered 2012-12-13 – 2012-12-17 (×11): 3 g via INTRAVENOUS
  Filled 2012-12-13 (×15): qty 3000

## 2012-12-13 MED ORDER — SODIUM CHLORIDE 0.9 % IJ SOLN
3.0000 mL | Freq: Two times a day (BID) | INTRAMUSCULAR | Status: DC
  Administered 2012-12-14 – 2012-12-16 (×2): 3 mL via INTRAVENOUS

## 2012-12-13 MED ORDER — MENTHOL 3 MG MT LOZG: 1.0000 | LOZENGE | OROMUCOSAL | Status: DC | PRN

## 2012-12-13 MED ORDER — ONDANSETRON HCL 4 MG/2ML IJ SOLN
INTRAMUSCULAR | Status: AC
  Filled 2012-12-13: qty 2

## 2012-12-13 MED ORDER — GLYCOPYRROLATE 0.2 MG/ML IJ SOLN
INTRAMUSCULAR | Status: DC | PRN
  Administered 2012-12-13: 0.4 mg via INTRAVENOUS

## 2012-12-13 MED ORDER — SODIUM CHLORIDE 0.9 % IJ SOLN: 9.0000 mL | INTRAMUSCULAR | Status: DC | PRN

## 2012-12-13 MED ORDER — SUFENTANIL CITRATE 50 MCG/ML IV SOLN
INTRAVENOUS | Status: DC | PRN
  Administered 2012-12-13: 20 ug via INTRAVENOUS
  Administered 2012-12-13 (×3): 10 ug via INTRAVENOUS

## 2012-12-13 MED ORDER — LIDOCAINE HCL (CARDIAC) 20 MG/ML IV SOLN
INTRAVENOUS | Status: DC | PRN
  Administered 2012-12-13: 100 mg via INTRAVENOUS

## 2012-12-13 MED ORDER — LACTATED RINGERS IV SOLN
INTRAVENOUS | Status: DC
  Administered 2012-12-13 (×4): via INTRAVENOUS

## 2012-12-13 MED ORDER — PROPOFOL 10 MG/ML IV BOLUS
INTRAVENOUS | Status: DC | PRN
  Administered 2012-12-13: 400 mg via INTRAVENOUS

## 2012-12-13 MED ORDER — OXYCODONE-ACETAMINOPHEN 5-325 MG PO TABS
1.0000 | ORAL_TABLET | ORAL | Status: DC | PRN
  Administered 2012-12-14 – 2012-12-17 (×4): 2 via ORAL
  Filled 2012-12-13 (×4): qty 2

## 2012-12-13 MED ORDER — KCL IN DEXTROSE-NACL 20-5-0.45 MEQ/L-%-% IV SOLN
INTRAVENOUS | Status: DC
  Administered 2012-12-13 – 2012-12-14 (×2): via INTRAVENOUS
  Filled 2012-12-13 (×14): qty 1000

## 2012-12-13 MED ORDER — FENTANYL CITRATE 0.05 MG/ML IJ SOLN
INTRAMUSCULAR | Status: DC | PRN
  Administered 2012-12-13 (×2): 50 ug via INTRAVENOUS

## 2012-12-13 MED ORDER — BUPROPION HCL ER (SR) 150 MG PO TB12
450.0000 mg | ORAL_TABLET | Freq: Every day | ORAL | Status: DC
  Administered 2012-12-14 – 2012-12-17 (×4): 450 mg via ORAL
  Filled 2012-12-13 (×4): qty 3

## 2012-12-13 MED ORDER — THROMBIN 20000 UNITS EX SOLR
CUTANEOUS | Status: DC | PRN
  Administered 2012-12-13: 15:00:00 via TOPICAL

## 2012-12-13 MED ORDER — NALOXONE HCL 0.4 MG/ML IJ SOLN: 0.4000 mg | INTRAMUSCULAR | Status: DC | PRN

## 2012-12-13 MED ORDER — 0.9 % SODIUM CHLORIDE (POUR BTL) OPTIME
TOPICAL | Status: DC | PRN
  Administered 2012-12-13: 1000 mL

## 2012-12-13 MED ORDER — ONDANSETRON HCL 4 MG/2ML IJ SOLN
4.0000 mg | INTRAMUSCULAR | Status: DC | PRN
  Administered 2012-12-14: 4 mg via INTRAVENOUS
  Filled 2012-12-13: qty 2

## 2012-12-13 MED ORDER — ONDANSETRON HCL 4 MG/2ML IJ SOLN
4.0000 mg | Freq: Once | INTRAMUSCULAR | Status: AC | PRN
  Administered 2012-12-13: 4 mg via INTRAVENOUS

## 2012-12-13 SURGICAL SUPPLY — 81 items
ADH SKN CLS APL DERMABOND .7 (GAUZE/BANDAGES/DRESSINGS) ×1
APL SKNCLS STERI-STRIP NONHPOA (GAUZE/BANDAGES/DRESSINGS) ×1
BAG DECANTER FOR FLEXI CONT (MISCELLANEOUS) ×2 IMPLANT
BENZOIN TINCTURE PRP APPL 2/3 (GAUZE/BANDAGES/DRESSINGS) ×2 IMPLANT
BLADE SURG 11 STRL SS (BLADE) ×2 IMPLANT
BLADE SURG ROTATE 9660 (MISCELLANEOUS) ×1 IMPLANT
BRUSH SCRUB EZ PLAIN DRY (MISCELLANEOUS) ×2 IMPLANT
BUR MATCHSTICK NEURO 3.0 LAGG (BURR) ×2 IMPLANT
BUR PRECISION FLUTE 6.0 (BURR) ×2 IMPLANT
CANISTER SUCT 3000ML (MISCELLANEOUS) ×2 IMPLANT
CAP LOCKING THREADED (Cap) ×5 IMPLANT
CONT SPEC 4OZ CLIKSEAL STRL BL (MISCELLANEOUS) ×4 IMPLANT
COVER BACK TABLE 24X17X13 BIG (DRAPES) IMPLANT
COVER TABLE BACK 60X90 (DRAPES) ×2 IMPLANT
CROSSLINK SPINAL FUSION (Cage) ×2 IMPLANT
DECANTER SPIKE VIAL GLASS SM (MISCELLANEOUS) ×2 IMPLANT
DERMABOND ADVANCED (GAUZE/BANDAGES/DRESSINGS) ×1
DERMABOND ADVANCED .7 DNX12 (GAUZE/BANDAGES/DRESSINGS) ×1 IMPLANT
DRAPE C-ARM 42X72 X-RAY (DRAPES) ×4 IMPLANT
DRAPE LAPAROTOMY 100X72X124 (DRAPES) ×2 IMPLANT
DRAPE POUCH INSTRU U-SHP 10X18 (DRAPES) ×2 IMPLANT
DRAPE PROXIMA HALF (DRAPES) IMPLANT
DRAPE SURG 17X23 STRL (DRAPES) ×2 IMPLANT
DRSG OPSITE 4X5.5 SM (GAUZE/BANDAGES/DRESSINGS) ×3 IMPLANT
DRSG OPSITE POSTOP 4X8 (GAUZE/BANDAGES/DRESSINGS) ×1 IMPLANT
DURAPREP 26ML APPLICATOR (WOUND CARE) ×2 IMPLANT
ELECT BLADE 4.0 EZ CLEAN MEGAD (MISCELLANEOUS) ×2
ELECT REM PT RETURN 9FT ADLT (ELECTROSURGICAL) ×2
ELECTRODE BLDE 4.0 EZ CLN MEGD (MISCELLANEOUS) ×1 IMPLANT
ELECTRODE REM PT RTRN 9FT ADLT (ELECTROSURGICAL) ×1 IMPLANT
EVACUATOR 3/16  PVC DRAIN (DRAIN) ×1
EVACUATOR 3/16 PVC DRAIN (DRAIN) ×1 IMPLANT
GAUZE SPONGE 4X4 16PLY XRAY LF (GAUZE/BANDAGES/DRESSINGS) ×4 IMPLANT
GLOVE BIO SURGEON STRL SZ8 (GLOVE) ×5 IMPLANT
GLOVE BIOGEL PI IND STRL 6.5 (GLOVE) IMPLANT
GLOVE BIOGEL PI IND STRL 7.5 (GLOVE) ×2 IMPLANT
GLOVE BIOGEL PI IND STRL 8.5 (GLOVE) IMPLANT
GLOVE BIOGEL PI INDICATOR 6.5 (GLOVE) ×1
GLOVE BIOGEL PI INDICATOR 7.5 (GLOVE) ×2
GLOVE BIOGEL PI INDICATOR 8.5 (GLOVE) ×1
GLOVE ECLIPSE 7.5 STRL STRAW (GLOVE) ×3 IMPLANT
GLOVE EXAM NITRILE LRG STRL (GLOVE) IMPLANT
GLOVE EXAM NITRILE MD LF STRL (GLOVE) ×2 IMPLANT
GLOVE EXAM NITRILE XL STR (GLOVE) IMPLANT
GLOVE EXAM NITRILE XS STR PU (GLOVE) IMPLANT
GLOVE INDICATOR 8.5 STRL (GLOVE) ×4 IMPLANT
GLOVE SURG SS PI 6.5 STRL IVOR (GLOVE) ×2 IMPLANT
GOWN BRE IMP SLV AUR LG STRL (GOWN DISPOSABLE) ×2 IMPLANT
GOWN BRE IMP SLV AUR XL STRL (GOWN DISPOSABLE) ×6 IMPLANT
GOWN STRL REIN 2XL LVL4 (GOWN DISPOSABLE) ×1 IMPLANT
GRAFT BN 5X1XSPNE CVD POST DBM (Bone Implant) ×2 IMPLANT
GRAFT BONE MAGNIFUSE 1X5CM (Bone Implant) ×4 IMPLANT
KIT BASIN OR (CUSTOM PROCEDURE TRAY) ×2 IMPLANT
KIT INFUSE SMALL (Orthopedic Implant) ×1 IMPLANT
KIT ROOM TURNOVER OR (KITS) ×2 IMPLANT
NDL HYPO 25X1 1.5 SAFETY (NEEDLE) ×1 IMPLANT
NEEDLE HYPO 25X1 1.5 SAFETY (NEEDLE) ×2 IMPLANT
NS IRRIG 1000ML POUR BTL (IV SOLUTION) ×2 IMPLANT
PACK LAMINECTOMY NEURO (CUSTOM PROCEDURE TRAY) ×2 IMPLANT
PAD ARMBOARD 7.5X6 YLW CONV (MISCELLANEOUS) ×8 IMPLANT
ROD 70MM SPINAL (Rod) ×1 IMPLANT
ROD 85MM SPINAL (Rod) ×1 IMPLANT
SCREW CREO 5.5X50 (Screw) ×2 IMPLANT
SCREW CREO 6.5X40 (Screw) ×1 IMPLANT
SCREW PA THRD CREO TULIP 5.5X4 (Head) ×5 IMPLANT
SCREW SPINE CREO AMP 6.5X50 (Screw) ×2 IMPLANT
SPONGE GAUZE 4X4 12PLY (GAUZE/BANDAGES/DRESSINGS) ×2 IMPLANT
SPONGE LAP 4X18 X RAY DECT (DISPOSABLE) IMPLANT
SPONGE SURGIFOAM ABS GEL 100 (HEMOSTASIS) ×2 IMPLANT
STRIP CLOSURE SKIN 1/2X4 (GAUZE/BANDAGES/DRESSINGS) ×4 IMPLANT
SUT VIC AB 0 CT1 18XCR BRD8 (SUTURE) ×1 IMPLANT
SUT VIC AB 0 CT1 8-18 (SUTURE) ×4
SUT VIC AB 2-0 CT1 18 (SUTURE) ×3 IMPLANT
SUT VICRYL 4-0 PS2 18IN ABS (SUTURE) ×2 IMPLANT
SYR 20ML ECCENTRIC (SYRINGE) ×2 IMPLANT
TAPE STRIPS DRAPE STRL (GAUZE/BANDAGES/DRESSINGS) ×1 IMPLANT
TOWEL OR 17X24 6PK STRL BLUE (TOWEL DISPOSABLE) ×2 IMPLANT
TOWEL OR 17X26 10 PK STRL BLUE (TOWEL DISPOSABLE) ×2 IMPLANT
TRAY FOLEY CATH 14FRSI W/METER (CATHETERS) ×2 IMPLANT
TRAY FOLEY CATH 16FRSI W/METER (SET/KITS/TRAYS/PACK) ×2 IMPLANT
WATER STERILE IRR 1000ML POUR (IV SOLUTION) ×2 IMPLANT

## 2012-12-13 NOTE — Transfer of Care (Signed)
Immediate Anesthesia Transfer of Care Note  Patient: [name]  Procedure(s) Performed: Procedure(s): POSTERIOR LUMBAR ONE- TWO, TWO-THREE DECOMPRESSION, L1-L3 PEDICAL SCREW FIXATION, POSTERIOR LATERAL FUSION  (N/A)  Patient Location: PACU  Anesthesia Type:General  Level of Consciousness: awake and alert   Airway & Oxygen Therapy: Patient Spontanous Breathing and Patient connected to face mask oxygen  Post-op Assessment: Report given to PACU RN and Post -op Vital signs reviewed and stable  Post vital signs: Reviewed and stable  Complications: No apparent anesthesia complications

## 2012-12-13 NOTE — Anesthesia Procedure Notes (Signed)
Procedure Name: Intubation Date/Time: 12/13/2012 3:00 PM Performed by: Charm Barges, [place] R Pre-anesthesia Checklist: Patient identified, Emergency Drugs available, Suction available, Patient being monitored and Timeout performed Patient Re-evaluated:Patient Re-evaluated prior to inductionOxygen Delivery Method: Circle system utilized Preoxygenation: Pre-oxygenation with 100% oxygen Intubation Type: IV induction Ventilation: Mask ventilation with difficulty Grade View: Grade II Tube type: Oral Tube size: 8.0 mm Number of attempts: 1 Airway Equipment and Method: Rigid stylet and Video-laryngoscopy Placement Confirmation: ETT inserted through vocal cords under direct vision,  positive ETCO2 and breath sounds checked- equal and bilateral Secured at: 24 cm Tube secured with: Tape Dental Injury: Teeth and Oropharynx as per pre-operative assessment  Difficulty Due To: Difficulty was anticipated, Difficult Airway- due to large tongue, Difficult Airway- due to reduced neck mobility and Difficult Airway- due to limited oral opening Future Recommendations: Recommend- induction with short-acting agent, and alternative techniques readily available

## 2012-12-13 NOTE — Anesthesia Postprocedure Evaluation (Signed)
  Anesthesia Post-op Note  Patient: [name]  Procedure(s) Performed: Procedure(s): POSTERIOR LUMBAR ONE- TWO, TWO-THREE DECOMPRESSION, L1-L3 PEDICAL SCREW FIXATION, POSTERIOR LATERAL FUSION  (N/A)  Patient Location: PACU  Anesthesia Type:General  Level of Consciousness: awake  Airway and Oxygen Therapy: Patient Spontanous Breathing  Post-op Pain: mild  Post-op Assessment: Post-op Vital signs reviewed  Post-op Vital Signs: Reviewed  Complications: No apparent anesthesia complications

## 2012-12-13 NOTE — Preoperative (Signed)
Beta Blockers   Reason not to administer Beta Blockers:Nebivolol taken at 1000 hrs on 12/13/2012

## 2012-12-13 NOTE — Op Note (Signed)
Preoperative diagnosis: Degenerative disc disease and severe lumbar spinal stenosis L1-2 and L2-3 with severe foraminal stenosis of the L1, L2, and L3 nerve roots herniated nucleus pulposus L1-2 and L2-3 degenerative scoliosis L1-L2 3 and grade 1 spondylolisthesis L2-3  Postoperative diagnosis: Same  Procedure:  Decompressive lumbar laminectomies L1-2 and redo decompressive lumbar laminectomy L2-3 complete medial facetectomies and radical foraminotomies of the L1, L2 and L3 nerve roots bilaterally  #2 pedicle screw fixation L1-L3 using the globus Creo pedicle screw system  #3posterolateral arthordesis using Autograpt, BMP, and bone in a bag  Surgeon: Jillyn Hidden Sarahi Borland  Assistant: Tressie Stalker  Anesthesia: Gen.  EBL: 1500 with Cell Saver 700 cc given back  History of present illness: Patient is a 57 year old gentleman who injured his back at work after slipping on agree staining falling down some steps 2 years ago initially was treated conservatively however ultimately failed conservative treatment imaging showed large disc herniation was partially calcified severe spinal stenosis grade 1 spondylolisthesis and degenerative disc disease and due to patient's progression of clinical syndrome failed conservative treatment imaging findings we recommended decompression stabilization procedure at L1-2 and L2-3 I extensively reviewed the risks and benefits of the operation the patient as well as perioperative course expectations about alternatives surgery and he understood and agreed to proceed forward.  Operative procedure: Patient brought into the or was induced under general anesthesia and positioned prone Wilson frame his old incision was opened up and extended cephalad caudally subcutaneous dissection was carried out through the muscle fascia and then subperiosteal dissections care lamina of L1-L2 and L3 dissecting through the scar tissue at L2-3 on the left exposing the TPS at L1, L2, L3. Interoperative  x-ray confirmed appropriate level so at this point the facet complexes noted be markedly hypertrophic the spinous processes at L1 and L2 removed in part of the spinous grasp the spinous process of L3 using a combination of a high-speed drill and Kerrison rongeurs and Leksell rongeurs and decompression was begun first working at L1 to there was marked hourglass compression of thecal sac there was severe facet arthropathy and marked overgrowth of the bone along the posterior elements. Patient bone had was very dense was extremely strong consistent with an underlying arthritic condition but I extensive medial facetectomies were performed until decompress the thecal sac there was marked hourglass compression of thecal sac from facet arthropathy and due to degenerative disc and the calcified herniation there is marked foraminal stenosis both the L1-L2 nerve roots these foramina were all opened up bilaterally after completely facetectomies were performed marking inferiorly and working through the scar at L2-3 the L2-3 level was also decompressed again marked facet arthropathy grade 1 spinal listhesis severe hyperostosis contributed to stenosis and a radical L2 and L3 foraminotomies were carried out down into the superior half of the L3 lamina to get below the stenosis. At the end of decompression there is no further stenosis at L1-L2 3 attention second pedicle screw placement first working at L1 under fluoroscopy pilot also drilled K. with the awl probed O45 tap and 5 5 x 50 screws were inserted L1 and working at L2 on the right this pedicle fractured as it was threading the screw I there is no does not have pedicle to reposition of the screws were [this level and extended down to L3 on the right. And again a 6 5 x 40 screws inserted as the entire pedicles at all levels were highly sclerotic and and tapping with both a 55 and a  65 Prior to placement of all my 65 screws. All 3 screws at L1 L2-L3 rarely place the left side  all screws excellent purchase after all screws in place AP lateral fluoroscopy confirmed good position of the implants the wounds and to proceed her get meticulous hemostasis was maintained aggressive decortication was care MTPs or lateral gutters bilaterally BMP sponges local are autograft and bone and back were overlaid posterior laterally along the TPS from L1-L3 along the lateral facet complexes. All the foraminal reinspected confirm patency Gelfoam was overlaid top of the dura the muscle fascia reapproximated layers with after Vicryl after a cross-link and a Hemovac in place and the skin was closed running 4 subcuticular Dermabond benzo and Steri-Strips and drain was sewn in. Then the patient recovered in stable condition. In the case on it counts sponge counts were correct.

## 2012-12-13 NOTE — H&P (Signed)
[name] is an 57 y.o. male.   Chief Complaint: Back and left greater right leg pain HPI:  patient is a very pleasant 57 year old who injured his back at work slipped on some grease fell down some steps sustaining injury to his back event happen about 2 years ago initially did okay with physical therapy however has progressed he continue to work on light duty but had progressive worsening back pain bilateral hip and leg pain anterior quad medial thigh down to his knee his was consistent with an L2 and L3 nerve root pattern workup with MRI scan subsequent CAT scan showed very large calcified disc herniations as well as severe lumbar spinal stenosis and spondylosis with degeneration at L1-L2 3 due to patient takes her treatment imaging findings and progression of clinical syndrome I recommended decompression stabilization procedure at L1-L2 3 I extensively reviewed the risks and benefits of the operation with the patient as well as perioperative course and expectations of outcome and alternatives of surgery and he understood and agreed to proceed forward.  Past Medical History  Diagnosis Date  . Degenerative disk disease   . Chronic back pain   . Chronic knee pain   . Morbid obesity   . Hypertension   . Depression   . Myocardial infarct 1999  . Coronary artery disease 1999  . Difficult intubation     dx 05/14/07 [facility]); has anterior larynx, small mouth; glidescope #3 used in 2012 ([facility])    Past Surgical History  Procedure Laterality Date  . Revision total knee arthroplasty    . Elbow surgery    . Joint replacement Bilateral 2012    (R) 03/2000: (L) 10/12  . Back surgery  2009    Dr Channing Mutters  . Tonsillectomy    . Carpal tunnel release Right 2006    had nerve impingement  . Achilles tendon surgery Left     reattached   . Shoulder arthroscopy with rotator cuff repair Right 01/2012    History reviewed. No pertinent family history. Social History:  reports that he quit smoking about 15 years  ago. His smoking use included Cigarettes. He has a 7 pack-year smoking history. He has never used smokeless tobacco. He reports that he does not drink alcohol or use illicit drugs.  Allergies: No Known Allergies  Medications Prior to Admission  Medication Sig Dispense Refill  . aspirin 325 MG tablet Take 325 mg by mouth daily.      Marland Kitchen atorvastatin (LIPITOR) 40 MG tablet Take 40 mg by mouth daily.      Marland Kitchen buPROPion (WELLBUTRIN SR) 150 MG 12 hr tablet Take 450 mg by mouth daily.      . Choline Fenofibrate (TRILIPIX) 135 MG capsule Take 135 mg by mouth daily.      Marland Kitchen etodolac (LODINE) 400 MG tablet Take 400 mg by mouth 2 (two) times daily.      Marland Kitchen L-Methylfolate (DEPLIN) 15 MG TABS Take 15 mg by mouth daily.      Marland Kitchen lisinopril (PRINIVIL,ZESTRIL) 20 MG tablet Take 20 mg by mouth daily.      Marland Kitchen LORazepam (ATIVAN) 0.5 MG tablet Take 0.5 mg by mouth every 4 (four) hours as needed for anxiety.       . nebivolol (BYSTOLIC) 5 MG tablet Take 5 mg by mouth daily.      . OxyCODONE (OXYCONTIN) 20 mg T12A 12 hr tablet Take 20 mg by mouth every 12 (twelve) hours.      . Oxycodone HCl 10  MG TABS Take 10 mg by mouth as needed.      Marland Kitchen oxyCODONE-acetaminophen (PERCOCET) 10-325 MG per tablet Take 1 tablet by mouth every 4 (four) hours as needed for pain.      . tadalafil (CIALIS) 20 MG tablet Take 20 mg by mouth daily as needed.      . triamterene-hydrochlorothiazide (MAXZIDE) 75-50 MG per tablet Take 1 tablet by mouth daily.      . Vitamin D, Ergocalciferol, (DRISDOL) 50000 UNITS CAPS capsule Take 50,000 Units by mouth every 7 (seven) days.      . traZODone (DESYREL) 50 MG tablet Take 50 mg by mouth at bedtime.        No results found for this or any previous visit (from the past 48 hour(s)). No results found.  Review of Systems  Constitutional: Negative.   HENT: Negative.   Eyes: Negative.   Respiratory: Negative.   Cardiovascular: Negative.   Gastrointestinal: Negative.   Musculoskeletal: Positive for back  pain, joint pain and myalgias.  Skin: Negative.   Neurological: Positive for tingling and sensory change.  Endo/Heme/Allergies: Negative.   Psychiatric/Behavioral: Negative.     Blood pressure 138/80, pulse 69, temperature 97.4 F (36.3 C), temperature source Oral, resp. rate 20, SpO2 99.00%. Physical Exam  Constitutional: He is oriented to person, place, and time. He appears well-developed and well-nourished.  HENT:  Head: Normocephalic and atraumatic.  Eyes: Pupils are equal, round, and reactive to light.  Neck: Normal range of motion.  Cardiovascular: Normal rate.   Respiratory: Effort normal.  GI: Soft.  Musculoskeletal: Normal range of motion.  Neurological: He is alert and oriented to person, place, and time. He has normal strength. GCS eye subscore is 4. GCS verbal subscore is 5. GCS motor subscore is 6.  Reflex Scores:      Patellar reflexes are 0 on the right side and 0 on the left side.      Achilles reflexes are 0 on the right side and 0 on the left side. Strength is 5 out of 5 in his iliopsoas, quads, hip she's, gastrocs, anterior tibialis, and EHL.  Skin: Skin is warm and dry.     Assessment/Plan 57 year old gentleman presents for decompression stabilization procedure at L1-L3  [name] P 12/13/2012, 1:49 PM

## 2012-12-13 NOTE — Anesthesia Preprocedure Evaluation (Addendum)
Anesthesia Evaluation  Patient identified by MRN, date of birth, ID band Patient awake    Reviewed: Allergy & Precautions, H&P , NPO status , Patient's Chart, lab work & pertinent test results  History of Anesthesia Complications (+) DIFFICULT AIRWAY and history of anesthetic complications  Airway Mallampati: III  Neck ROM: Limited  Mouth opening: Limited Mouth Opening  Dental  (+) Partial Lower, Teeth Intact and Dental Advisory Given   Pulmonary former smoker,          Cardiovascular hypertension, + CAD and + Past MI     Neuro/Psych    GI/Hepatic   Endo/Other  Morbid obesity  Renal/GU      Musculoskeletal   Abdominal   Peds  Hematology   Anesthesia Other Findings   Reproductive/Obstetrics                          Anesthesia Physical Anesthesia Plan  ASA: III  Anesthesia Plan: General   Post-op Pain Management:    Induction: Intravenous  Airway Management Planned: Oral ETT  Additional Equipment:   Intra-op Plan:   Post-operative Plan: Extubation in OR  Informed Consent: I have reviewed the patients History and Physical, chart, labs and discussed the procedure including the risks, benefits and alternatives for the proposed anesthesia with the patient or authorized representative who has indicated his/her understanding and acceptance.   Dental advisory given  Plan Discussed with: CRNA, Anesthesiologist and Surgeon  Anesthesia Plan Comments:        Anesthesia Quick Evaluation

## 2012-12-14 NOTE — Progress Notes (Signed)
Subjective: Patient reports Is doing well no leg pain a little bit of numbness in the inner part of his thigh but otherwise no complaints  Objective: Vital signs in last 24 hours: Temp:  [97.6 F (36.4 C)-98.9 F (37.2 C)] 98.9 F (37.2 C) (12/09 0948) Pulse Rate:  [74-92] 92 (12/09 0948) Resp:  [11-18] 11 (12/09 1148) BP: (97-146)/(48-96) 97/65 mmHg (12/09 0948) SpO2:  [94 %-100 %] 96 % (12/09 1148) Weight:  [181.257 kg (399 lb 9.6 oz)] 181.257 kg (399 lb 9.6 oz) (12/08 2158)  Intake/Output from previous day: 12/08 0701 - 12/09 0700 In: 4800 [I.V.:3500; Blood:700; IV Piggyback:600] Out: 2985 [Urine:1185; Drains:300; Blood:1500] Intake/Output this shift: Total I/O In: -  Out: 700 [Urine:475; Drains:225]  Strength is 5 out of 5 wound is clean and dry  Lab Results: No results found for this basename: WBC, HGB, HCT, PLT,  in the last 72 hours BMET No results found for this basename: NA, K, CL, CO2, GLUCOSE, BUN, CREATININE, CALCIUM,  in the last 72 hours  Studies/Results: Dg Lumbar Spine 2-3 Views  12/13/2012   CLINICAL DATA:  L2-for PLIF  EXAM: DG C-ARM 61-120 MIN; LUMBAR SPINE - 2-3 VIEW  COMPARISON:  CT of the lumbar spine on 11/18/2012  FINDINGS: Dental and lateral views are performed, showing posterior retractor in place. Transpedicular screws are identified at 3 levels. By history this is at L2-4.  IMPRESSION: Intraoperative films.   Electronically Signed   By: Rosalie Gums M.D.   On: 12/13/2012 21:41   Dg C-arm 61-120 Min  12/13/2012   CLINICAL DATA:  L2-for PLIF  EXAM: DG C-ARM 61-120 MIN; LUMBAR SPINE - 2-3 VIEW  COMPARISON:  CT of the lumbar spine on 11/18/2012  FINDINGS: Dental and lateral views are performed, showing posterior retractor in place. Transpedicular screws are identified at 3 levels. By history this is at L2-4.  IMPRESSION: Intraoperative films.   Electronically Signed   By: Rosalie Gums M.D.   On: 12/13/2012 21:41    Assessment/Plan: Continue mobilization  with physical and occupational therapy continued PCA canal Foley was DC'd continue to monitor urine output  LOS: 1 day     [name] P 12/14/2012, 12:36 PM

## 2012-12-14 NOTE — Progress Notes (Signed)
   CARE MANAGEMENT NOTE 12/14/2012  Patient:  [name], [name]   Account Number:  [id number]  Date Initiated:  12/14/2012  Documentation initiated by:  Jiles Crocker  Subjective/Objective Assessment:   ADMITTED FOR SURGERY     Action/Plan:   CM FOLLOWING FOR DCP   Anticipated DC Date:  12/17/2012   Anticipated DC Plan: POSSIBLY HOME W HOME HEALTH SERVICES, AWAITING ON PT/OT EVALS FOR DC NEEDS     DC Planning Services  CM consult          Status of service:  In process, will continue to follow Medicare Important Message given?  NA - LOS <3 / Initial given by admissions (If response is "NO", the following Medicare IM given date fields will be blank)  Per UR Regulation:  Reviewed for med. necessity/level of care/duration of stay  Comments:  12/14/2012- B Yaacov Koziol RN,BSN,MHA

## 2012-12-14 NOTE — Evaluation (Signed)
Occupational Therapy Evaluation Patient Details Name: [name] MRN: [id number] DOB: 12-Dec-1955 Today's Date: 12/14/2012 Time: 4098-1191 OT Time Calculation (min): 39 min  OT Assessment / Plan / Recommendation History of present illness 57 yo male s/p L1-3 PLF   Clinical Impression   Patient is s/p L1-3  surgery resulting in functional limitations due to the deficits listed below (see OT problem list).  Patient will benefit from skilled OT acutely to increase independence and safety with ADLS to allow discharge SNF.     OT Assessment  Patient needs continued OT Services    Follow Up Recommendations  SNF    Barriers to Discharge Other (comment) (wife with recent knee surg limited support)    Equipment Recommendations  None recommended by OT    Recommendations for Other Services    Frequency  Min 2X/week    Precautions / Restrictions Precautions Precautions: Back Precaution Comments: back handout provided and reviewed Required Braces or Orthoses: Spinal Brace Spinal Brace: Lumbar corset;Applied in sitting position Restrictions Weight Bearing Restrictions: No   Pertinent Vitals/Pain Minimal pain "feels better to sit in this chair than that bed"    ADL  Eating/Feeding: Set up Where Assessed - Eating/Feeding: Chair Grooming: Teeth care;Wash/dry face;Set up Where Assessed - Grooming: Supported sitting Lower Body Dressing: +1 Total assistance Where Assessed - Lower Body Dressing: Supported sit to Pharmacist, hospital: +2 Total assistance Toilet Transfer: Patient Percentage: 60% Toilet Transfer Method: Sit to Barista: Raised toilet seat with arms (or 3-in-1 over toilet) Equipment Used: Gait belt;Back brace;Rolling walker Transfers/Ambulation Related to ADLs: Pt sit<>stand from elevated bed surface total + 2 60 % with RW. Pt ambulated to chair and positioned in sitting. Pt educated on change of position every 45 minutes and avoid reclining ADL  Comments: Pt educated on back precautions and bed mobility. pt educated on don doff brace and proper fit. pt required brace adjustment with standing due to body habitus. pt progressing well at this point and time.     OT Diagnosis: Generalized weakness;Acute pain  OT Problem List: Decreased strength;Decreased activity tolerance;Impaired balance (sitting and/or standing);Decreased safety awareness;Decreased knowledge of use of DME or AE;Decreased knowledge of precautions;Obesity;Pain OT Treatment Interventions: Self-care/ADL training;Therapeutic exercise;DME and/or AE instruction;Therapeutic activities;Patient/family education;Balance training   OT Goals(Current goals can be found in the care plan section) Acute Rehab OT Goals Patient Stated Goal: to return to normal activity OT Goal Formulation: With patient/family Time For Goal Achievement: 12/28/12 Potential to Achieve Goals: Good  Visit Information  Last OT Received On: 12/14/12 Assistance Needed: +2 PT/OT/SLP Co-Evaluation/Treatment: Yes OT goals addressed during session: ADL's and self-care;Other (comment) (due to body habitus) History of Present Illness: 57 yo male s/p L1-3 PLF       Prior Functioning     Home Living Family/patient expects to be discharged to:: Private residence Living Arrangements: Spouse/significant other Type of Home: House Home Access: Stairs to enter Secretary/administrator of Steps: 2 Entrance Stairs-Rails: None Home Layout: Multi-level;Bed/bath upstairs Alternate Level Stairs-Number of Steps: 9 steps both directions Alternate Level Stairs-Rails: Left Home Equipment: Bedside commode;Shower seat;Cane - single point;Crutches;Adaptive equipment;Other (comment) Librarian, academic) Adaptive Equipment: Reacher Prior Function Level of Independence: Independent Communication Communication: No difficulties Dominant Hand: Right         Vision/Perception Vision - History Baseline Vision: Wears glasses  all the time Patient Visual Report: No change from baseline   Cognition  Cognition Arousal/Alertness: Awake/alert Behavior During Therapy: WFL for tasks assessed/performed Overall Cognitive Status: Within Functional  Limits for tasks assessed    Extremity/Trunk Assessment Upper Extremity Assessment Upper Extremity Assessment: Overall WFL for tasks assessed (previous Rt shoulder rotator cuff surg) Lower Extremity Assessment Lower Extremity Assessment: Defer to PT evaluation (BIL Knee surg) Cervical / Trunk Assessment Cervical / Trunk Assessment: Normal     Mobility Bed Mobility Bed Mobility: Rolling Left;Left Sidelying to Sit;Sitting - Scoot to [place] of Bed;Supine to Sit Rolling Left: 4: Min assist;With rail Left Sidelying to Sit: 4: Min assist;With rails Supine to Sit: 1: +2 Total assist Supine to Sit: Patient Percentage: 60% Sitting - Scoot to Edge of Bed: With rail Details for Bed Mobility Assistance: pt needed extended time to progress toward EOB Transfers Transfers: Sit to Stand;Stand to Sit Sit to Stand: 1: +2 Total assist;With upper extremity assist;From bed;From elevated surface Sit to Stand: Patient Percentage: 60% Stand to Sit: 1: +2 Total assist;With upper extremity assist;To chair/3-in-1     Exercise     Balance     End of Session OT - End of Session Activity Tolerance: Patient tolerated treatment well Patient left: in chair;with call bell/phone within reach;with family/visitor present Nurse Communication: Mobility status;Precautions  GO     Harolyn Rutherford 12/14/2012, 9:47 AM Pager: [phone]

## 2012-12-14 NOTE — Evaluation (Signed)
Physical Therapy Evaluation Patient Details Name: [name] MRN: [id number] DOB: Oct 06, 1955 Today's Date: 12/14/2012 Time: 1478-2956 PT Time Calculation (min): 39 min  PT Assessment / Plan / Recommendation History of Present Illness  57 yo male s/p L1-3 PLF  Clinical Impression  Patient demonstrates deficits in functional mobility as indicated below. Pt will benefit from continued skilled PT to address deficits and maximize function. Will need ST SNF upon discharge to ensure safety with mobility.    PT Assessment  Patient needs continued PT services    Follow Up Recommendations  SNF;Supervision/Assistance - 24 hour    Does the patient have the potential to tolerate intense rehabilitation      Barriers to Discharge Inaccessible home environment;Decreased caregiver support pt wife just had surgery on her knee recently, pt also iin split level with multiple steps     Equipment Recommendations  Rolling walker with 5" wheels    Recommendations for Other Services     Frequency Min 5X/week    Precautions / Restrictions Precautions Precautions: Back Precaution Booklet Issued: Yes (comment) Precaution Comments: back handout provided and reviewed Required Braces or Orthoses: Spinal Brace Spinal Brace: Lumbar corset;Applied in sitting position   Pertinent Vitals/Pain 6/10 pain initially, improved in chair position      Mobility  Bed Mobility Bed Mobility: Rolling Left;Left Sidelying to Sit;Sitting - Scoot to [place] of Bed;Supine to Sit Rolling Left: 4: Min assist;With rail Left Sidelying to Sit: 4: Min assist;With rails Supine to Sit: 1: +2 Total assist Supine to Sit: Patient Percentage: 60% Sitting - Scoot to Edge of Bed: With rail Details for Bed Mobility Assistance: pt needed extended time to progress toward EOB Transfers Transfers: Sit to Stand;Stand to Sit Sit to Stand: 1: +2 Total assist;With upper extremity assist;From bed;From elevated surface Sit to Stand: Patient  Percentage: 60% Stand to Sit: 1: +2 Total assist;With upper extremity assist;To chair/3-in-1 Stand to Sit: Patient Percentage: 60% Details for Transfer Assistance: VCs for hand placement, assist for elevation and stability Ambulation/Gait Ambulation/Gait Assistance: 3: Mod assist Ambulation Distance (Feet): 8 Feet Assistive device: Rolling walker Ambulation/Gait Assistance Details: instability with bilateral LEs upon functional movement Gait Pattern: Step-to pattern;Decreased stride length;Trunk flexed    Exercises     PT Diagnosis: Difficulty walking;Abnormality of gait;Generalized weakness;Acute pain  PT Problem List: Decreased strength;Decreased activity tolerance;Decreased balance;Decreased mobility;Obesity;Pain PT Treatment Interventions: DME instruction;Gait training;Stair training;Functional mobility training;Therapeutic activities;Therapeutic exercise;Balance training;Patient/family education     PT Goals(Current goals can be found in the care plan section) Acute Rehab PT Goals Patient Stated Goal: to return to normal activity PT Goal Formulation: With patient/family Time For Goal Achievement: 12/28/12 Potential to Achieve Goals: Good  Visit Information  Last PT Received On: 12/14/12 Assistance Needed: +2 PT/OT/SLP Co-Evaluation/Treatment: Yes PT goals addressed during session: Mobility/safety with mobility;Balance;Proper use of DME History of Present Illness: 56 yo male s/p L1-3 PLF       Prior Functioning  Home Living Family/patient expects to be discharged to:: Private residence Living Arrangements: Spouse/significant other Type of Home: House Home Access: Stairs to enter Secretary/administrator of Steps: 2 Entrance Stairs-Rails: None Home Layout: Multi-level;Bed/bath upstairs Alternate Level Stairs-Number of Steps: 9 steps both directions Alternate Level Stairs-Rails: Left Home Equipment: Bedside commode;Shower seat;Cane - single point;Crutches;Adaptive  equipment;Other (comment) Librarian, academic) Adaptive Equipment: Reacher Prior Function Level of Independence: Independent Communication Communication: No difficulties Dominant Hand: Right    Cognition  Cognition Arousal/Alertness: Awake/alert Behavior During Therapy: WFL for tasks assessed/performed Overall Cognitive Status: Within Functional Limits  for tasks assessed    Extremity/Trunk Assessment Lower Extremity Assessment Lower Extremity Assessment: Generalized weakness (hx of bilateral knee replacement) Cervical / Trunk Assessment Cervical / Trunk Assessment: Normal      End of Session PT - End of Session Equipment Utilized During Treatment: Gait belt;Oxygen Activity Tolerance: Patient limited by fatigue;Patient limited by pain Patient left: in chair;with call bell/phone within reach;with family/visitor present Nurse Communication: Mobility status  GP     [name] 12/14/2012, 4:35 PM [name], PT DPT  [phone]

## 2012-12-15 MED FILL — Heparin Sodium (Porcine) Inj 1000 Unit/ML: INTRAMUSCULAR | Qty: 30 | Status: AC

## 2012-12-15 MED FILL — Sodium Chloride Irrigation Soln 0.9%: Qty: 3000 | Status: AC

## 2012-12-15 NOTE — Progress Notes (Signed)
Occupational Therapy Treatment Patient Details Name: Rafiel Mecca MRN: [id number] DOB: Aug 30, 1955 Today's Date: 12/15/2012 Time: 0823-0901 OT Time Calculation (min): 38 min  OT Assessment / Plan / Recommendation  History of present illness 57 yo male s/p L1-3 PLF   OT comments  Pt progressing well with OT at this time and will focus next session on bed mobility and LB dressing. Pt plans to purchase toilet aid for peri hygiene.  Follow Up Recommendations  SNF    Barriers to Discharge       Equipment Recommendations  None recommended by OT    Recommendations for Other Services    Frequency Min 2X/week   Progress towards OT Goals Progress towards OT goals: Progressing toward goals  Plan Discharge plan remains appropriate    Precautions / Restrictions Precautions Precautions: Back Required Braces or Orthoses: Spinal Brace Spinal Brace: Lumbar corset;Applied in sitting position   Pertinent Vitals/Pain None reported    ADL  Eating/Feeding: Modified independent Where Assessed - Eating/Feeding: Bed level Grooming: Wash/dry hands;Wash/dry face;Teeth care;Min guard Where Assessed - Grooming: Unsupported standing (cues to avoid bending) Toilet Transfer: Min Psychologist, sport and exercise: Regular height toilet (standing to void bladder- pt reports odor to void) Toileting - Architect and Hygiene: Min guard Where Assessed - Glass blower/designer Manipulation and Hygiene: Standing Equipment Used: Rolling walker;Back brace Transfers/Ambulation Related to ADLs: Pt ambulating Min guard (A) with rw to bathroom ADL Comments: Pt required (A) for bed mobility and use of rail . Pt static standing at toilet and sink for grooming. Pt with coughing noted. Incentive spirometer provided. Pt don brace MOD I. Pt needed dressing to wound reinforced with extra tape. Pt educated on toilet aid and pt plans to purchase aid.    OT Diagnosis:    OT Problem List:   OT Treatment Interventions:      OT Goals(current goals can now be found in the care plan section) Acute Rehab OT Goals Patient Stated Goal: to return to normal activity OT Goal Formulation: With patient/family Time For Goal Achievement: 12/28/12 Potential to Achieve Goals: Good ADL Goals Pt Will Perform Grooming: with supervision;standing Pt Will Perform Upper Body Bathing: with min assist;with adaptive equipment;sitting Pt Will Perform Lower Body Bathing: with min assist;with adaptive equipment;sit to/from stand Pt Will Transfer to Toilet: with min assist;ambulating;bedside commode Additional ADL Goal #1: Pt will don doff brace MOD I   Visit Information  Last OT Received On: 12/15/12 Assistance Needed: +1 History of Present Illness: 57 yo male s/p L1-3 PLF    Subjective Data      Prior Functioning       Cognition  Cognition Arousal/Alertness: Awake/alert Behavior During Therapy: WFL for tasks assessed/performed Overall Cognitive Status: Within Functional Limits for tasks assessed    Mobility  Bed Mobility Bed Mobility: Rolling Left;Left Sidelying to Sit;Supine to Sit;Sitting - Scoot to Edge of Bed Rolling Left: 5: Supervision;With rail Left Sidelying to Sit: 5: Supervision;With rails Supine to Sit: 5: Supervision;With rails Sitting - Scoot to Edge of Bed: 5: Supervision;With rail Details for Bed Mobility Assistance: pt required use of bed rail to completed bed mobility Transfers Transfers: Stand to Sit;Sit to Stand Sit to Stand: 3: Mod assist;With upper extremity assist;From bed Stand to Sit: With upper extremity assist;To chair/3-in-1;5: Supervision Details for Transfer Assistance: pt much improved transfer    Exercises      Balance     End of Session OT - End of Session Activity Tolerance: Patient tolerated treatment well Patient left:  in chair;with call bell/phone within reach Nurse Communication: Mobility status;Precautions  GO     Harolyn Rutherford 12/15/2012, 9:04 AM Pager:  [phone]

## 2012-12-15 NOTE — Clinical Social Work Placement (Signed)
Clinical Social Work Department CLINICAL SOCIAL WORK PLACEMENT NOTE 12/15/2012  Patient:  BRIANA, NEWMAN  Account Number:  [id number] Admit date:  12/13/2012  Clinical Social Worker:  Irving Burton [place], LCSWA  Date/time:  12/15/2012 03:30 PM  Clinical Social Work is seeking post-discharge placement for this patient at the following level of care:   SKILLED NURSING   (*CSW will update this form in Epic as items are completed)   12/15/2012  Patient/family provided with [facility] Department of Clinical Social Works list of facilities offering this level of care within the geographic area requested by the patient (or if unable, by the patients family).  12/15/2012  Patient/family informed of their freedom to choose among providers that offer the needed level of care, that participate in Medicare, Medicaid or managed care program needed by the patient, have an available bed and are willing to accept the patient.  12/15/2012  Patient/family informed of [facility] ownership interest in [facility], as well as of the fact that they are under no obligation to receive care at this facility.  PASARR submitted to EDS on 12/15/2012 PASARR number received from [employer] on 12/15/2012  FL2 transmitted to all facilities in geographic area requested by pt/family on  12/15/2012 FL2 transmitted to all facilities within larger geographic area on   Patient informed that his/her managed care company has contracts with or will negotiate with  certain facilities, including the following:     Patient/family informed of bed offers received:   Patient chooses bed at  Physician recommends and patient chooses bed at    Patient to be transferred to  on   Patient to be transferred to facility by   The following physician request were entered in Epic:   Additional Comments:  Darlyn Chamber, Theresia Majors Clinical Social Worker [phone]

## 2012-12-15 NOTE — Progress Notes (Signed)
Physical Therapy Treatment Patient Details Name: [name] MRN: [id number] DOB: [birth year] Today's Date: 12/15/2012 Time: 4098-1191 PT Time Calculation (min): 18 min  PT Assessment / Plan / Recommendation  History of Present Illness 57 yo male s/p L1-3 PLF   PT Comments   Patient demonstrates improvements in activity tolerance and functional mobility. Patient does still require assist for transfers. Continue to recommend ST SNF to obtain independence with stair negotiation and mobility prior to dc home. Patient in agreement. Will continue to see and progress activity as tolerated.   Follow Up Recommendations  SNF;Supervision/Assistance - 24 hour           Equipment Recommendations  Rolling walker with 5" wheels       Frequency Min 5X/week   Progress towards PT Goals Progress towards PT goals: Progressing toward goals  Plan Current plan remains appropriate    Precautions / Restrictions Precautions Precautions: Back Required Braces or Orthoses: Spinal Brace Spinal Brace: Lumbar corset;Applied in sitting position   Pertinent Vitals/Pain 5/10 during ambulation 3/10 at rest    Mobility  Bed Mobility Bed Mobility: Not assessed Rolling Left: 5: Supervision;With rail Left Sidelying to Sit: 5: Supervision;With rails Supine to Sit: 5: Supervision;With rails Sitting - Scoot to Edge of Bed: 5: Supervision;With rail Details for Bed Mobility Assistance: recieved in chair from OT Transfers Transfers: Sit to Stand;Stand to Sit Sit to Stand: 4: Min assist;With upper extremity assist;From chair/3-in-1 Stand to Sit: 5: Supervision;With upper extremity assist;To chair/3-in-1 Details for Transfer Assistance: Improved ability to come to standing with use of chair rails for support Ambulation/Gait Ambulation/Gait Assistance: 4: Min guard Ambulation Distance (Feet): 210 Feet Assistive device: Rolling walker Ambulation/Gait Assistance Details: one standing rest break, VCs for upright  posture and emergine step through gait Gait Pattern: Step-through pattern;Decreased stride length;Trunk flexed;Wide base of support      PT Goals (current goals can now be found in the care plan section) Acute Rehab PT Goals Patient Stated Goal: to return to normal activity PT Goal Formulation: With patient/family Time For Goal Achievement: 12/28/12 Potential to Achieve Goals: Good  Visit Information  Last PT Received On: 12/15/12 Assistance Needed: +1 History of Present Illness: 57 yo male s/p L1-3 PLF    Subjective Data  Patient Stated Goal: to return to normal activity   Cognition  Cognition Arousal/Alertness: Awake/alert Behavior During Therapy: WFL for tasks assessed/performed Overall Cognitive Status: Within Functional Limits for tasks assessed    Balance  Balance Balance Assessed: Yes Static Standing Balance Static Standing - Balance Support: Bilateral upper extremity supported;During functional activity Static Standing - Level of Assistance: 5: Stand by assistance Static Standing - Comment/# of Minutes: 2 minutes  End of Session PT - End of Session Equipment Utilized During Treatment: Gait belt;Oxygen Activity Tolerance: Patient limited by fatigue;Patient limited by pain Patient left: in chair;with call bell/phone within reach;with family/visitor present Nurse Communication: Mobility status   GP     [name] 12/15/2012, 9:18 AM Charlotte Crumb, PT DPT  [phone]

## 2012-12-15 NOTE — Progress Notes (Signed)
Subjective: Patient reports Is doing well no leg pain and still has some numbness in the L3 distribution bilaterally but he does not extend below his knees his accident posterior thigh does not involve his groin or his perineum has been stable since immediately postop  Objective: Vital signs in last 24 hours: Temp:  [97.9 F (36.6 C)-98.9 F (37.2 C)] 98.1 F (36.7 C) (12/10 0526) Pulse Rate:  [66-92] 83 (12/10 0526) Resp:  [11-24] 18 (12/10 0526) BP: (97-151)/(49-73) 131/59 mmHg (12/10 0526) SpO2:  [94 %-100 %] 94 % (12/10 0526)  Intake/Output from previous day: 12/09 0701 - 12/10 0700 In: 240 [P.O.:240] Out: 1490 [Urine:925; Drains:565] Intake/Output this shift:    Strength out of 5 wound clean and dry ambulating and voiding well  Lab Results: No results found for this basename: WBC, HGB, HCT, PLT,  in the last 72 hours BMET No results found for this basename: NA, K, CL, CO2, GLUCOSE, BUN, CREATININE, CALCIUM,  in the last 72 hours  Studies/Results: Dg Lumbar Spine 2-3 Views  12/13/2012   CLINICAL DATA:  L2-for PLIF  EXAM: DG C-ARM 61-120 MIN; LUMBAR SPINE - 2-3 VIEW  COMPARISON:  CT of the lumbar spine on 11/18/2012  FINDINGS: Dental and lateral views are performed, showing posterior retractor in place. Transpedicular screws are identified at 3 levels. By history this is at L2-4.  IMPRESSION: Intraoperative films.   Electronically Signed   By: Rosalie Gums M.D.   On: 12/13/2012 21:41   Dg C-arm 61-120 Min  12/13/2012   CLINICAL DATA:  L2-for PLIF  EXAM: DG C-ARM 61-120 MIN; LUMBAR SPINE - 2-3 VIEW  COMPARISON:  CT of the lumbar spine on 11/18/2012  FINDINGS: Dental and lateral views are performed, showing posterior retractor in place. Transpedicular screws are identified at 3 levels. By history this is at L2-4.  IMPRESSION: Intraoperative films.   Electronically Signed   By: Rosalie Gums M.D.   On: 12/13/2012 21:41    Assessment/Plan: Continue progressive mobilization with  physical and occupational therapy  LOS: 2 days     [name] P 12/15/2012, 9:32 AM

## 2012-12-15 NOTE — Clinical Social Work Psychosocial (Signed)
Clinical Social Work Department BRIEF PSYCHOSOCIAL ASSESSMENT 12/15/2012  Patient:  [name], [name]     Account Number:  [id number]     Admit date:  12/13/2012  Clinical Social Worker:  Sherre Lain  Date/Time:  12/15/2012 03:25 PM  Referred by:  Physician  Date Referred:  12/15/2012 Referred for  SNF Placement   Other Referral:   none.   Interview type:  Patient Other interview type:   none.    PSYCHOSOCIAL DATA Living Status:  WIFE Admitted from facility:   Level of care:   Primary support name:  Johnmichael Melhorn Primary support relationship to patient:  SPOUSE Degree of support available:   Strong support system.    CURRENT CONCERNS Current Concerns  Post-Acute Placement   Other Concerns:   none.    SOCIAL WORK ASSESSMENT / PLAN CSW met with pt at bedside. CSW informed pt of CSW role at [facility]. Pt was receptive to speaking with CSW. Pt stated that prior to admission to [facility], pt was living at home with his wife. Pt informed CSW that pt's wife is currently recovering from a knee surgery and would not be able to care for pt until he became stronger. Pt stated that he is agreeable to SNF placement in [place] or [facility], but understood that Workers Comp will make the final decision.    CSW contacted Vicente Serene [phone]), regarding SNF placement for pt. Cordelia Pen informed CSW to continue with SNF placement search in [place] and [place], and for CSW to contact [place] on 12/16/2012 with bed offers.    CSW to continue to follow and assist with discharge planning needs.   Assessment/plan status:  Psychosocial Support/Ongoing Assessment of Needs Other assessment/ plan:   none.   Information/referral to community resources:   Workers Occupational hygienist. SNF bed placement offers.    PATIENTS/FAMILYS RESPONSE TO PLAN OF CARE: Pt was understanding and agreeable to CSW plan of care.     Darlyn Chamber, LCSWA Clinical Social Worker [phone]

## 2012-12-15 NOTE — Progress Notes (Signed)
Talked to patient about DCP; patient is agreeable to SNF placement at discharge; Patient is a Workers Comp case- everything must be approved through Workers Comp for approval. Retail buyer with Workers Comp is Thurston Pounds [phone]; Abelino Derrick RN,BSN,MHA

## 2012-12-16 MED ORDER — HYDROMORPHONE HCL PF 1 MG/ML IJ SOLN
1.0000 mg | INTRAMUSCULAR | Status: DC | PRN
  Administered 2012-12-17: 1 mg via INTRAVENOUS
  Filled 2012-12-16: qty 1

## 2012-12-16 NOTE — Progress Notes (Signed)
Subjective: Patient reports Had a rough night last night anomaly of knee pain his numbness in his legs improving and the pain his knee is nonradicular is localized to the back of his knee.  Objective: Vital signs in last 24 hours: Temp:  [97.8 F (36.6 C)-98.7 F (37.1 C)] 98.1 F (36.7 C) (12/11 0545) Pulse Rate:  [66-85] 70 (12/11 0545) Resp:  [12-18] 16 (12/11 0545) BP: (111-188)/(60-97) 160/97 mmHg (12/11 0545) SpO2:  [91 %-99 %] 99 % (12/11 0545)  Intake/Output from previous day: 12/10 0701 - 12/11 0700 In: -  Out: 360 [Drains:360] Intake/Output this shift: Total I/O In: -  Out: 100 [Drains:100]  Strength is 5 out of 5 he has a lot of tenderness in the posterior popliteal fossa there is some warmth and swelling to his legs as well.  Lab Results: No results found for this basename: WBC, HGB, HCT, PLT,  in the last 72 hours BMET No results found for this basename: NA, K, CL, CO2, GLUCOSE, BUN, CREATININE, CALCIUM,  in the last 72 hours  Studies/Results: No results found.  Assessment/Plan: stat venous duplex Doppler to rule out DVT if this is negative we'll continue to mobilize with physical and occupational therapy  LOS: 3 days     [name] P 12/16/2012, 6:38 AM

## 2012-12-16 NOTE — Progress Notes (Signed)
Physical Therapy Treatment Patient Details Name: Lashawn Orrego MRN: [id number] DOB: 05-Aug-1955 Today's Date: 12/16/2012 Time: 1016-1030 PT Time Calculation (min): 14 min  PT Assessment / Plan / Recommendation  History of Present Illness 57 yo male s/p L1-3 PLF   PT Comments   Patient progressing well with mobility. Up in recliner upon entering room. Patient very motivated.  Patient will have limited help from wife as she is on a RW just recovering from knee surgery and patient has 9 stairs to enter his home. Continue to recommend SNF for continued therapy to increased functional independence prior to returning home.  Follow Up Recommendations  SNF;Supervision/Assistance - 24 hour     Does the patient have the potential to tolerate intense rehabilitation     Barriers to Discharge        Equipment Recommendations  Rolling walker with 5" wheels    Recommendations for Other Services    Frequency Min 5X/week   Progress towards PT Goals Progress towards PT goals: Progressing toward goals  Plan Current plan remains appropriate    Precautions / Restrictions Precautions Precautions: Back Precaution Comments: Patient able to recall all precautions. Educated on positioniong of pillow when lying on back and on side Required Braces or Orthoses: Spinal Brace Spinal Brace: Lumbar corset;Applied in sitting position   Pertinent Vitals/Pain no apparent distress    Mobility  Bed Mobility Bed Mobility: Not assessed Transfers Sit to Stand: 4: Min assist;With upper extremity assist;From chair/3-in-1 Stand to Sit: 5: Supervision;With upper extremity assist;To chair/3-in-1 Details for Transfer Assistance: Still requiring a small amount of support when standing from the recliner. Good technique Ambulation/Gait Ambulation/Gait Assistance: 4: Min guard Ambulation Distance (Feet): 250 Feet Assistive device: Rolling walker Ambulation/Gait Assistance Details: Patient required one standing rest  break but overall moving well. Cued to decreased weight through RW.  Gait Pattern: Step-through pattern;Decreased stride length;Wide base of support    Exercises     PT Diagnosis:    PT Problem List:   PT Treatment Interventions:     PT Goals (current goals can now be found in the care plan section)    Visit Information  Last PT Received On: 12/16/12 Assistance Needed: +1 History of Present Illness: 57 yo male s/p L1-3 PLF    Subjective Data      Cognition  Cognition Arousal/Alertness: Awake/alert Behavior During Therapy: WFL for tasks assessed/performed Overall Cognitive Status: Within Functional Limits for tasks assessed    Balance     End of Session PT - End of Session Equipment Utilized During Treatment: Gait belt Activity Tolerance: Patient tolerated treatment well Patient left: in chair;with call bell/phone within reach Nurse Communication: Mobility status   GP     Fredrich Birks 12/16/2012, 10:36 AM 12/16/2012 Fredrich Birks PTA [phone] pager [phone] office

## 2012-12-16 NOTE — Progress Notes (Signed)
VASCULAR LAB PRELIMINARY  PRELIMINARY  PRELIMINARY  PRELIMINARY  Bilateral lower extremity venous Dopplers completed.    Preliminary report:  There is no DVT or SVT noted in the bilateral lower extremities.  Rima Blizzard, RVT 12/16/2012, 11:18 AM

## 2012-12-16 NOTE — Clinical Social Work Note (Signed)
CSW notified Cordelia Pen, with workers comp, of pt's only SNF bed offer to [employer] [facility] and [employer]. Cordelia Pen stated that she will contact admissions coordinator with [facility] regarding insurance authorization. CSW to follow-up with Cordelia Pen regarding pt's approval to [facility]. Cordelia Pen is aware of pt's possible discharge from [facility] on 12/17/2012.  Darlyn Chamber, LCSWA Clinical Social Worker [phone]

## 2012-12-17 MED ORDER — OXYCODONE HCL 5 MG PO TABS: 10.0000 mg | ORAL_TABLET | Freq: Four times a day (QID) | ORAL | Status: DC | PRN

## 2012-12-17 MED ORDER — OXYCODONE HCL ER 20 MG PO T12A: 20.0000 mg | EXTENDED_RELEASE_TABLET | Freq: Two times a day (BID) | ORAL | Status: DC

## 2012-12-17 NOTE — Progress Notes (Signed)
Physical Therapy Treatment Patient Details Name: Jamen Loiseau MRN: [id number] DOB: 14-Apr-1955 Today's Date: 12/17/2012 Time: 1478-2956 PT Time Calculation (min): 26 min  PT Assessment / Plan / Recommendation  History of Present Illness 57 yo male s/p L1-3 PLF   PT Comments   Able to work on bed mobility today. Continues to rely on rails for assistance. Awaiting SNF at this time as patient has limited support at home as wife has just had surgery. Patient also have split level home with 9 steps to enter  Follow Up Recommendations  SNF;Supervision/Assistance - 24 hour     Does the patient have the potential to tolerate intense rehabilitation     Barriers to Discharge        Equipment Recommendations  Rolling walker with 5" wheels    Recommendations for Other Services    Frequency Min 5X/week   Progress towards PT Goals Progress towards PT goals: Progressing toward goals  Plan Current plan remains appropriate    Precautions / Restrictions Precautions Precautions: Back Precaution Comments: Patient able to recall all precautions.  Required Braces or Orthoses: Spinal Brace Spinal Brace: Lumbar corset;Applied in sitting position Restrictions Weight Bearing Restrictions: No   Pertinent Vitals/Pain Complained of leg soreness but did not rate    Mobility  Bed Mobility Rolling Left: 5: Supervision;With rail Left Sidelying to Sit: 5: Supervision;With rails Details for Bed Mobility Assistance: cues to log roll and no twisting Transfers Sit to Stand: 4: Min assist;With upper extremity assist;From bed Stand to Sit: 5: Supervision;With upper extremity assist;To bed Details for Transfer Assistance: Still requiring a small amount of support when standing from the recliner. Good technique Ambulation/Gait Ambulation/Gait Assistance: 4: Min guard Ambulation Distance (Feet): 250 Feet Assistive device: Rolling walker Ambulation/Gait Assistance Details: Continues to decrease weight  through RW Gait Pattern: Step-through pattern;Decreased stride length;Wide base of support    Exercises     PT Diagnosis:    PT Problem List:   PT Treatment Interventions:     PT Goals (current goals can now be found in the care plan section)    Visit Information  Last PT Received On: 12/17/12 Assistance Needed: +1 History of Present Illness: 57 yo male s/p L1-3 PLF    Subjective Data      Cognition  Cognition Arousal/Alertness: Awake/alert Behavior During Therapy: WFL for tasks assessed/performed Overall Cognitive Status: Within Functional Limits for tasks assessed    Balance     End of Session PT - End of Session Equipment Utilized During Treatment: Gait belt;Back brace Activity Tolerance: Patient tolerated treatment well Patient left: in chair;with call bell/phone within reach Nurse Communication: Mobility status   GP     Fredrich Birks 12/17/2012, 9:31 AM 12/17/2012 Fredrich Birks PTA [phone] pager [phone] office

## 2012-12-17 NOTE — Discharge Summary (Signed)
Physician Discharge Summary  Patient ID: [name] MRN: [id number] DOB/AGE: 01-29-1955 57 y.o.  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 Morbid obesity  Discharge Diagnoses: Spondylosis and stenosis L1 to and L2-3 with spondylolisthesis L2-3. Morbid obesity Active Problems:   Spinal stenosis of lumbar region   Discharged Condition: fair  Hospital Course: Patient was admitted to undergo surgical decompression and stabilization at L1-2 and L2-3 this was performed on the eighth. Patient tolerated the surgery well however because of his large size he required significant help with ambulation and stability. He has to be able to manage stairs at his house living in a split-level home. It is felt that he is an appropriate candidate for a transient stay in a skilled nursing facility.  Consults: None  Significant Diagnostic Studies: None  Treatments: surgery: Decompression of L1-2 and L2-3 posterior interbody arthrodesis with peek spacers segmental fixation L1-L3 with pedicle screws.  Discharge Exam: Blood pressure 129/78, pulse 79, temperature 99.6 F (37.6 C), temperature source Oral, resp. rate 19, height 6\' 1"  (1.854 m), weight 181.257 kg (399 lb 9.6 oz), SpO2 97.00%. Incision is clean and dry. Motor function is intact. Surgical drain has been removed.  Disposition: Skilled nursing facility  Discharge Orders   Future Orders Complete By Expires   Diet - low sodium heart healthy  As directed    Increase activity slowly  As directed        Medication List    TAKE these medications       aspirin 325 MG tablet  Take 325 mg by mouth daily.     atorvastatin 40 MG tablet  Commonly known as:  LIPITOR  Take 40 mg by mouth daily.     buPROPion 150 MG 12 hr tablet  Commonly known as:  WELLBUTRIN SR  Take 450 mg by mouth daily.     DEPLIN 15 MG Tabs  Take 15 mg by mouth daily.     etodolac 400 MG tablet  Commonly known as:  LODINE  Take 400 mg by mouth 2 (two) times daily.     lisinopril 20 MG tablet  Commonly known as:  PRINIVIL,ZESTRIL  Take 20 mg by mouth daily.     LORazepam 0.5 MG tablet  Commonly known as:  ATIVAN  Take 0.5 mg by mouth every 4 (four) hours as needed for anxiety.     nebivolol 5 MG tablet  Commonly known as:  BYSTOLIC  Take 5 mg by mouth daily.     OxyCODONE 20 mg T12a 12 hr tablet  Commonly known as:  OXYCONTIN  Take 1 tablet (20 mg total) by mouth every 12 (twelve) hours.     oxyCODONE 5 MG immediate release tablet  Commonly known as:  ROXICODONE  Take 2 tablets (10 mg total) by mouth every 6 (six) hours as needed for severe pain.     OXYCONTIN 20 mg T12a 12 hr tablet  Generic drug:  OxyCODONE  Take 20 mg by mouth every 12 (twelve) hours.     Oxycodone HCl 10 MG Tabs  Take 10 mg by mouth as needed.     tadalafil 20 MG tablet  Commonly known as:  CIALIS  Take 20 mg by mouth daily as needed.     traZODone 50 MG tablet  Commonly known as:  DESYREL  Take 50 mg by mouth at bedtime.     triamterene-hydrochlorothiazide 75-50 MG per tablet  Commonly known as:  MAXZIDE  Take 1 tablet by mouth daily.     TRILIPIX 135 MG capsule  Generic drug:  Choline Fenofibrate  Take 135 mg by mouth daily.     Vitamin D (Ergocalciferol) 50000 UNITS Caps capsule  Commonly known as:  DRISDOL  Take 50,000 Units by mouth every 7 (seven) days.      ASK your doctor about these medications       oxyCODONE-acetaminophen 10-325 MG per tablet  Commonly known as:  PERCOCET  Take 1 tablet by mouth every 4 (four) hours as needed for pain.         SignedStefani Dama 12/17/2012, 11:40 AM

## 2012-12-17 NOTE — Clinical Social Work Note (Signed)
CSW spoke with pt's workers comp representative, Primary school teacher, regarding pt's discharge disposition. Cordelia Pen stated that she was unaware of the outcome of the claims adjuster and SNF conversation. CSW contacted admission coordinator with [employer] who informed CSW that pt was been authorized to be admitted to [facility] once medically stable for discharge. CSW left message with MD regarding pt's possible discharge date. CSW to continue to follow and assist with discharge planning needs.  Darlyn Chamber, LCSWA Clinical Social Worker [phone]

## 2013-04-09 ENCOUNTER — Encounter (HOSPITAL_BASED_OUTPATIENT_CLINIC_OR_DEPARTMENT_OTHER): Payer: Self-pay | Admitting: Emergency Medicine

## 2013-04-09 ENCOUNTER — Emergency Department (HOSPITAL_BASED_OUTPATIENT_CLINIC_OR_DEPARTMENT_OTHER)
Admission: EM | Admit: 2013-04-09 | Discharge: 2013-04-09 | Disposition: A | Discharge status: Home / Self Care | Payer: Worker's Compensation | Attending: Emergency Medicine | Admitting: Emergency Medicine

## 2013-04-09 ENCOUNTER — Emergency Department (HOSPITAL_BASED_OUTPATIENT_CLINIC_OR_DEPARTMENT_OTHER): Payer: Worker's Compensation

## 2013-04-09 DIAGNOSIS — M5416 Radiculopathy, lumbar region: Secondary | ICD-10-CM

## 2013-04-09 DIAGNOSIS — Y9389 Activity, other specified: Secondary | ICD-10-CM | POA: Diagnosis not present

## 2013-04-09 DIAGNOSIS — Y9289 Other specified places as the place of occurrence of the external cause: Secondary | ICD-10-CM | POA: Insufficient documentation

## 2013-04-09 DIAGNOSIS — I252 Old myocardial infarction: Secondary | ICD-10-CM | POA: Diagnosis not present

## 2013-04-09 DIAGNOSIS — Y990 Civilian activity done for income or pay: Secondary | ICD-10-CM | POA: Diagnosis not present

## 2013-04-09 DIAGNOSIS — Z7982 Long term (current) use of aspirin: Secondary | ICD-10-CM | POA: Diagnosis not present

## 2013-04-09 DIAGNOSIS — I10 Essential (primary) hypertension: Secondary | ICD-10-CM | POA: Diagnosis not present

## 2013-04-09 DIAGNOSIS — I2510 Atherosclerotic heart disease of native coronary artery without angina pectoris: Secondary | ICD-10-CM | POA: Insufficient documentation

## 2013-04-09 DIAGNOSIS — G8929 Other chronic pain: Secondary | ICD-10-CM | POA: Diagnosis not present

## 2013-04-09 DIAGNOSIS — W010XXA Fall on same level from slipping, tripping and stumbling without subsequent striking against object, initial encounter: Secondary | ICD-10-CM | POA: Insufficient documentation

## 2013-04-09 DIAGNOSIS — M545 Low back pain, unspecified: Secondary | ICD-10-CM | POA: Insufficient documentation

## 2013-04-09 DIAGNOSIS — Z9889 Other specified postprocedural states: Secondary | ICD-10-CM | POA: Insufficient documentation

## 2013-04-09 DIAGNOSIS — Z8659 Personal history of other mental and behavioral disorders: Secondary | ICD-10-CM | POA: Insufficient documentation

## 2013-04-09 DIAGNOSIS — Z87891 Personal history of nicotine dependence: Secondary | ICD-10-CM | POA: Insufficient documentation

## 2013-04-09 DIAGNOSIS — IMO0002 Reserved for concepts with insufficient information to code with codable children: Secondary | ICD-10-CM | POA: Diagnosis present

## 2013-04-09 MED ORDER — METHYLPREDNISOLONE 4 MG PO KIT: PACK | ORAL | Status: DC

## 2013-04-09 NOTE — ED Notes (Signed)
Pt had back surgery in December.  Pt fell at work on water.  Pt is having lower back pain, hip pain and left leg pain.  Some tingling on left leg.  No elimination problems.  Some left leg weakness.

## 2013-04-09 NOTE — ED Notes (Signed)
When the patient was being discharged, he stated that he needed a UDS and breathalyzer for work.

## 2013-04-09 NOTE — ED Provider Notes (Addendum)
CSN: [id number]     Arrival date & time 04/09/13  1016 History   First MD Initiated Contact with Patient 04/09/13 1124     Chief Complaint  Patient presents with  . Back Pain     (Consider location/radiation/quality/duration/timing/severity/associated sxs/prior Treatment) Patient is a 58 y.o. male presenting with back pain. The history is provided by the patient.  Back Pain Location:  Lumbar spine Quality:  Aching and stiffness Radiates to:  L thigh Pain severity:  Moderate Pain is:  Same all the time Onset quality:  Gradual Duration: started yesterday. Timing:  Constant Progression:  Worsening Chronicity:  Recurrent Context: falling   Context comment:  Pt has back surgery in december with spinal fusion and hardware placed who had a mechanical fall yesterday when he slipped on something wet in the bathroom and fell back on his left side.   Relieved by:  Nothing Worsened by:  Nothing tried Associated symptoms: numbness and weakness   Associated symptoms: no bladder incontinence and no bowel incontinence   Associated symptoms comment:  Initially just some back stiffness last night but today had had numbness on his inner thigh and some weakness of the leg.  No bowel or bladder sx. Risk factors: recent surgery     Past Medical History  Diagnosis Date  . Degenerative disk disease   . Chronic back pain   . Chronic knee pain   . Morbid obesity   . Hypertension   . Depression   . Myocardial infarct 1999  . Coronary artery disease 1999  . Difficult intubation     dx 05/14/07 [facility]); has anterior larynx, small mouth; glidescope #3 used in 2012 (HPR)   Past Surgical History  Procedure Laterality Date  . Revision total knee arthroplasty    . Elbow surgery    . Joint replacement Bilateral 2012    (R) 03/2000: (L) 10/12  . Back surgery  2009    Dr Channing Mutters  . Tonsillectomy    . Carpal tunnel release Right 2006    had nerve impingement  . Achilles tendon surgery Left     reattached    . Shoulder arthroscopy with rotator cuff repair Right 01/2012   No family history on file. History  Substance Use Topics  . Smoking status: Former Smoker -- 1.00 packs/day for 7 years    Types: Cigarettes    Quit date: 12/09/1997  . Smokeless tobacco: Never Used  . Alcohol Use: No    Review of Systems  Gastrointestinal: Negative for bowel incontinence.  Genitourinary: Negative for bladder incontinence.  Musculoskeletal: Positive for back pain.  Neurological: Positive for weakness and numbness.  All other systems reviewed and are negative.      Allergies  Review of patient's allergies indicates no known allergies.  Home Medications   Current Outpatient Rx  Name  Route  Sig  Dispense  Refill  . aspirin 325 MG tablet   Oral   Take 325 mg by mouth daily.         Marland Kitchen atorvastatin (LIPITOR) 40 MG tablet   Oral   Take 40 mg by mouth daily.         . Choline Fenofibrate (TRILIPIX) 135 MG capsule   Oral   Take 135 mg by mouth daily.         Marland Kitchen etodolac (LODINE) 400 MG tablet   Oral   Take 400 mg by mouth 2 (two) times daily.         Marland Kitchen lisinopril (PRINIVIL,ZESTRIL)  20 MG tablet   Oral   Take 20 mg by mouth daily.         . nebivolol (BYSTOLIC) 5 MG tablet   Oral   Take 5 mg by mouth daily.         . OxyCODONE (OXYCONTIN) 20 mg T12A 12 hr tablet   Oral   Take 20 mg by mouth every 12 (twelve) hours.         . OxyCODONE (OXYCONTIN) 20 mg T12A 12 hr tablet   Oral   Take 1 tablet (20 mg total) by mouth every 12 (twelve) hours.   60 tablet   0   . oxyCODONE (ROXICODONE) 5 MG immediate release tablet   Oral   Take 2 tablets (10 mg total) by mouth every 6 (six) hours as needed for severe pain.   60 tablet   0   . Oxycodone HCl 10 MG TABS   Oral   Take 10 mg by mouth as needed.         . tadalafil (CIALIS) 20 MG tablet   Oral   Take 20 mg by mouth daily as needed.         . triamterene-hydrochlorothiazide (MAXZIDE) 75-50 MG per tablet    Oral   Take 1 tablet by mouth daily.         . Vitamin D, Ergocalciferol, (DRISDOL) 50000 UNITS CAPS capsule   Oral   Take 50,000 Units by mouth every 7 (seven) days.          BP 168/89  Pulse 88  Temp(Src) 99.3 F (37.4 C) (Oral)  Resp 20  SpO2 96% Physical Exam  Nursing note and vitals reviewed. Constitutional: He is oriented to person, place, and time. He appears well-developed and well-nourished. No distress.  obese  HENT:  Head: Normocephalic and atraumatic.  Cardiovascular: Normal rate.   Pulmonary/Chest: Effort normal.  Musculoskeletal:  No l-spine tenderness and incision is well healed  Neurological: He is alert and oriented to person, place, and time.  Reflex Scores:      Achilles reflexes are 1+ on the right side and 0 on the left side. Numbness to palpation over the left inner thigh and left inner calf.  4/5 strength in the quadriceps muscle on the left. 5 out of 5 strength in the calf and hamstring.  Skin: Skin is warm and dry.  Psychiatric: He has a normal mood and affect. His behavior is normal.    ED Course  Procedures (including critical care time) Labs Review Labs Reviewed - No data to display Imaging Review Dg Lumbar Spine Complete  04/09/2013   CLINICAL DATA:  Recent traumatic injury and pain  EXAM: LUMBAR SPINE - COMPLETE 4+ VIEW  COMPARISON:  None.  FINDINGS: Five lumbar type vertebral bodies are well visualized. Multilevel osteophytic changes are seen. Prior fixation from L1-L3 is noted. No hardware failure is noted. No gross soft tissue abnormality is noted.  IMPRESSION: Degenerative changes and postoperative change. No acute abnormality is noted.   Electronically Signed   By: Alcide CleverMark  Lukens M.D.   On: 04/09/2013 12:15     EKG Interpretation None      MDM   Final diagnoses:  Lumbar radiculopathy, acute    Patient here with history of lumbar surgery in December return to work this week and Friday fell at work when he slipped on water" onto  his left side. In the last 24 hours he's developed progressive numbness of his left leg with mild  weakness of hip flexors only. He denies any bowel or bladder issues. No significant pain. Circulation is intact and patient is able to walk without dragging his leg. Plain film shows no change in placement of hardware and otherwise no acute findings. Findings discussed with the patient and feel most likely recurrent radiculopathy with disease and recent fall. Patient will speak with Dr. Wynetta Emery his neurosurgeon on Monday for further care. He needs to return immediately if he is unable to move his leg or develops bowel or bladder symptoms. Given a steroid dose pack    Gwyneth Sprout, MD 04/09/13 1235  Gwyneth Sprout, MD 04/09/13 1237

## 2013-04-12 NOTE — ED Notes (Signed)
pts employer Arie SabinaJeff Brasher from  [employer] called to confirm that Mr Mal AmabileBrock did present to our hospital for evaluation of an injury sustained at work.

## 2013-09-09 ENCOUNTER — Other Ambulatory Visit: Payer: Self-pay | Admitting: Neurosurgery

## 2013-09-13 ENCOUNTER — Encounter (HOSPITAL_COMMUNITY): Payer: Self-pay

## 2013-09-14 NOTE — Pre-Procedure Instructions (Signed)
[name]  09/14/2013   Your procedure is scheduled on:  Mon, Sept 14 @ 10:30 AM  Report to [facility]  at 8:30 AM.  Call this number if you have problems the morning of surgery: [phone]   Remember:   Do not eat food or drink liquids after midnight.   Take these medicines the morning of surgery with A SIP OF WATER: Nebivolol(Bystolic) and Pain Pill(if needed)                Stop taking your Aspirin and Lodine. No Goody's,BC's,Aleve,Ibuprofen,Fish Oil,or any Herbal Medications   Do not wear jewelry  Do not wear lotions, powders, or colognes. You may wear deodorant.  Men may shave face and neck.  Do not bring valuables to the hospital.  [facility] is not responsible                  for any belongings or valuables.               Contacts, dentures or bridgework may not be worn into surgery.  Leave suitcase in the car. After surgery it may be brought to your room.  For patients admitted to the hospital, discharge time is determined by your                treatment team.                Special Instructions:  [place] - Preparing for Surgery  Before surgery, you can play an important role.  Because skin is not sterile, your skin needs to be as free of germs as possible.  You can reduce the number of germs on you skin by washing with CHG (chlorahexidine gluconate) soap before surgery.  CHG is an antiseptic cleaner which kills germs and bonds with the skin to continue killing germs even after washing.  Please DO NOT use if you have an allergy to CHG or antibacterial soaps.  If your skin becomes reddened/irritated stop using the CHG and inform your nurse when you arrive at Short Stay.  Do not shave (including legs and underarms) for at least 48 hours prior to the first CHG shower.  You may shave your face.  Please follow these instructions carefully:   1.  Shower with CHG Soap the night before surgery and the                                morning of Surgery.  2.  If  you choose to wash your hair, wash your hair first as usual with your       normal shampoo.  3.  After you shampoo, rinse your hair and body thoroughly to remove the                      Shampoo.  4.  Use CHG as you would any other liquid soap.  You can apply chg directly       to the skin and wash gently with scrungie or a clean washcloth.  5.  Apply the CHG Soap to your body ONLY FROM THE NECK DOWN.        Do not use on open wounds or open sores.  Avoid contact with your eyes,       ears, mouth and genitals (private parts).  Wash genitals (private parts)       with your normal  soap.  6.  Wash thoroughly, paying special attention to the area where your surgery        will be performed.  7.  Thoroughly rinse your body with warm water from the neck down.  8.  DO NOT shower/wash with your normal soap after using and rinsing off       the CHG Soap.  9.  Pat yourself dry with a clean towel.            10.  Wear clean pajamas.            11.  Place clean sheets on your bed the night of your first shower and do not        sleep with pets.  Day of Surgery  Do not apply any lotions/deoderants the morning of surgery.  Please wear clean clothes to the hospital/surgery center.     Please read over the following fact sheets that you were given: Pain Booklet, Coughing and Deep Breathing, Blood Transfusion Information, MRSA Information and Surgical Site Infection Prevention

## 2013-09-15 ENCOUNTER — Encounter (HOSPITAL_COMMUNITY)
Admission: RE | Admit: 2013-09-15 | Discharge: 2013-09-15 | Disposition: A | Discharge status: Home / Self Care | Payer: Worker's Compensation | Source: Ambulatory Visit | Attending: Neurosurgery | Admitting: Neurosurgery

## 2013-09-15 ENCOUNTER — Encounter (HOSPITAL_COMMUNITY): Payer: Self-pay

## 2013-09-15 DIAGNOSIS — M5126 Other intervertebral disc displacement, lumbar region: Secondary | ICD-10-CM | POA: Diagnosis not present

## 2013-09-15 DIAGNOSIS — Z01812 Encounter for preprocedural laboratory examination: Secondary | ICD-10-CM | POA: Diagnosis present

## 2013-09-15 HISTORY — DX: Personal history of colonic polyps: Z86.010

## 2013-09-15 HISTORY — DX: Vitamin D deficiency, unspecified: E55.9

## 2013-09-15 HISTORY — DX: Benign prostatic hyperplasia without lower urinary tract symptoms: N40.0

## 2013-09-15 HISTORY — DX: Pain in unspecified joint: M25.50

## 2013-09-15 HISTORY — DX: Hyperlipidemia, unspecified: E78.5

## 2013-09-15 HISTORY — DX: Personal history of urinary calculi: Z87.442

## 2013-09-15 HISTORY — DX: Effusion, unspecified joint: M25.40

## 2013-09-15 LAB — SURGICAL PCR SCREEN
MRSA, PCR: NEGATIVE
STAPHYLOCOCCUS AUREUS: NEGATIVE

## 2013-09-15 LAB — BASIC METABOLIC PANEL
ANION GAP: 13 (ref 5–15)
BUN: 19 mg/dL (ref 6–23)
CALCIUM: 9.2 mg/dL (ref 8.4–10.5)
CO2: 23 mEq/L (ref 19–32)
Chloride: 101 mEq/L (ref 96–112)
Creatinine, Ser: 0.87 mg/dL (ref 0.50–1.35)
GFR calc Af Amer: >90 mL/min (ref >90)
GFR calc non Af Amer: >90 mL/min (ref >90)
Glucose, Bld: 105 mg/dL — ABNORMAL HIGH (ref 70–99)
Potassium: 4.3 mEq/L (ref 3.7–5.3)
Sodium: 137 mEq/L (ref 137–147)

## 2013-09-15 LAB — TYPE AND SCREEN
ABO/RH(D): O POS
Antibody Screen: NEGATIVE

## 2013-09-15 LAB — CBC
HCT: 49.4 % (ref 39.0–52.0)
Hemoglobin: 16.6 g/dL (ref 13.0–17.0)
MCH: 28.6 pg (ref 26.0–34.0)
MCHC: 33.6 g/dL (ref 30.0–36.0)
MCV: 85.2 fL (ref 78.0–100.0)
Platelets: 186 K/uL (ref 150–400)
RBC: 5.8 MIL/uL (ref 4.22–5.81)
RDW: 14.6 % (ref 11.5–15.5)
WBC: 5 K/uL (ref 4.0–10.5)

## 2013-09-15 NOTE — Progress Notes (Signed)
09/15/13 0934  OBSTRUCTIVE SLEEP APNEA  Have you ever been diagnosed with sleep apnea through a sleep study? No  Do you snore loudly (loud enough to be heard through closed doors)?  0  Do you often feel tired, fatigued, or sleepy during the daytime? 0  Has anyone observed you stop breathing during your sleep? 0  Do you have, or are you being treated for high blood pressure? 1  BMI more than 35 kg/m2? 1  Age over 57 years old? 1  Neck circumference greater than 40 cm/16 inches? 1  Gender: 1  Obstructive Sleep Apnea Score 5  Score 4 or greater  Results sent to PCP

## 2013-09-15 NOTE — Progress Notes (Addendum)
  Dr.Rasario with [facility] in [place]-last visit to be requested(May 18)  EKG and CXR in epic from 12-09-13  Sees PA Greta O'Buch with [facility]  Echo to be requested from [facility] -states has been done this year(05-09-13)  Stress test done about a yr ago and to be requested from [facility]   Denies ever having a heart cath

## 2013-09-18 MED ORDER — DEXAMETHASONE SODIUM PHOSPHATE 10 MG/ML IJ SOLN
10.0000 mg | INTRAMUSCULAR | Status: AC
  Administered 2013-09-19: 10 mg via INTRAVENOUS
  Filled 2013-09-18: qty 1

## 2013-09-18 MED ORDER — DEXTROSE 5 % IV SOLN
3.0000 g | INTRAVENOUS | Status: AC
  Administered 2013-09-19 (×2): 3 g via INTRAVENOUS
  Filled 2013-09-18: qty 3000

## 2013-09-19 ENCOUNTER — Encounter (HOSPITAL_COMMUNITY): Payer: Worker's Compensation | Admitting: Vascular Surgery

## 2013-09-19 ENCOUNTER — Inpatient Hospital Stay (HOSPITAL_COMMUNITY): Payer: Worker's Compensation

## 2013-09-19 ENCOUNTER — Inpatient Hospital Stay (HOSPITAL_COMMUNITY): Payer: Worker's Compensation | Admitting: Anesthesiology

## 2013-09-19 ENCOUNTER — Encounter (HOSPITAL_COMMUNITY): Payer: Self-pay | Admitting: Surgery

## 2013-09-19 ENCOUNTER — Inpatient Hospital Stay (HOSPITAL_COMMUNITY)
Admission: RE | Admit: 2013-09-19 | Discharge: 2013-09-24 | DRG: 460 | Disposition: A | Discharge status: Skilled Nursing Facility | Payer: Worker's Compensation | Source: Ambulatory Visit | Attending: Neurosurgery | Admitting: Neurosurgery

## 2013-09-19 ENCOUNTER — Encounter (HOSPITAL_COMMUNITY)
Admission: RE | Disposition: A | Discharge status: Skilled Nursing Facility | Payer: BC Managed Care – PPO | Source: Ambulatory Visit | Attending: Neurosurgery

## 2013-09-19 DIAGNOSIS — M5126 Other intervertebral disc displacement, lumbar region: Principal | ICD-10-CM | POA: Diagnosis present

## 2013-09-19 DIAGNOSIS — Z981 Arthrodesis status: Secondary | ICD-10-CM

## 2013-09-19 DIAGNOSIS — M48061 Spinal stenosis, lumbar region without neurogenic claudication: Secondary | ICD-10-CM | POA: Diagnosis present

## 2013-09-19 DIAGNOSIS — Z472 Encounter for removal of internal fixation device: Secondary | ICD-10-CM

## 2013-09-19 DIAGNOSIS — M47817 Spondylosis without myelopathy or radiculopathy, lumbosacral region: Secondary | ICD-10-CM | POA: Diagnosis present

## 2013-09-19 DIAGNOSIS — Z7982 Long term (current) use of aspirin: Secondary | ICD-10-CM

## 2013-09-19 DIAGNOSIS — E559 Vitamin D deficiency, unspecified: Secondary | ICD-10-CM | POA: Diagnosis present

## 2013-09-19 DIAGNOSIS — Z860100 Personal history of colon polyps, unspecified: Secondary | ICD-10-CM

## 2013-09-19 DIAGNOSIS — Z79899 Other long term (current) drug therapy: Secondary | ICD-10-CM

## 2013-09-19 DIAGNOSIS — E785 Hyperlipidemia, unspecified: Secondary | ICD-10-CM | POA: Diagnosis present

## 2013-09-19 DIAGNOSIS — Z87891 Personal history of nicotine dependence: Secondary | ICD-10-CM

## 2013-09-19 DIAGNOSIS — I10 Essential (primary) hypertension: Secondary | ICD-10-CM | POA: Diagnosis present

## 2013-09-19 DIAGNOSIS — I252 Old myocardial infarction: Secondary | ICD-10-CM

## 2013-09-19 DIAGNOSIS — Z6841 Body Mass Index (BMI) 40.0 and over, adult: Secondary | ICD-10-CM

## 2013-09-19 HISTORY — PX: LAMINECTOMY: SHX219

## 2013-09-19 SURGERY — POSTERIOR LUMBAR FUSION 1 WITH HARDWARE REMOVAL
Anesthesia: General | Site: Spine Lumbar

## 2013-09-19 MED ORDER — FENTANYL CITRATE 0.05 MG/ML IJ SOLN
INTRAMUSCULAR | Status: AC
  Filled 2013-09-19: qty 5

## 2013-09-19 MED ORDER — LIDOCAINE HCL (CARDIAC) 20 MG/ML IV SOLN
INTRAVENOUS | Status: DC | PRN
  Administered 2013-09-19: 100 mg via INTRAVENOUS

## 2013-09-19 MED ORDER — HYDROMORPHONE HCL PF 1 MG/ML IJ SOLN
INTRAMUSCULAR | Status: AC
  Filled 2013-09-19: qty 1

## 2013-09-19 MED ORDER — LACTATED RINGERS IV SOLN
INTRAVENOUS | Status: DC
  Administered 2013-09-19: 10:00:00 via INTRAVENOUS

## 2013-09-19 MED ORDER — ONDANSETRON HCL 4 MG/2ML IJ SOLN
INTRAMUSCULAR | Status: DC | PRN
  Administered 2013-09-19: 4 mg via INTRAVENOUS

## 2013-09-19 MED ORDER — PROMETHAZINE HCL 25 MG/ML IJ SOLN
25.0000 mg | Freq: Once | INTRAMUSCULAR | Status: AC
  Administered 2013-09-19: 25 mg via INTRAVENOUS
  Filled 2013-09-19: qty 1

## 2013-09-19 MED ORDER — THROMBIN 20000 UNITS EX SOLR
CUTANEOUS | Status: DC | PRN
  Administered 2013-09-19 (×2): via TOPICAL

## 2013-09-19 MED ORDER — NEBIVOLOL HCL 5 MG PO TABS
5.0000 mg | ORAL_TABLET | Freq: Every day | ORAL | Status: DC
  Administered 2013-09-20 – 2013-09-24 (×5): 5 mg via ORAL
  Filled 2013-09-19 (×5): qty 1

## 2013-09-19 MED ORDER — MIDAZOLAM HCL 5 MG/5ML IJ SOLN
INTRAMUSCULAR | Status: DC | PRN
  Administered 2013-09-19: 2 mg via INTRAVENOUS

## 2013-09-19 MED ORDER — HYDROMORPHONE HCL PF 1 MG/ML IJ SOLN
0.5000 mg | INTRAMUSCULAR | Status: DC | PRN
  Administered 2013-09-19 – 2013-09-20 (×3): 1 mg via INTRAVENOUS
  Filled 2013-09-19 (×4): qty 1

## 2013-09-19 MED ORDER — ONDANSETRON HCL 4 MG/2ML IJ SOLN
INTRAMUSCULAR | Status: AC
  Filled 2013-09-19: qty 2

## 2013-09-19 MED ORDER — DIPHENHYDRAMINE HCL 50 MG/ML IJ SOLN
INTRAMUSCULAR | Status: AC
  Filled 2013-09-19: qty 1

## 2013-09-19 MED ORDER — OXYCODONE HCL 5 MG PO TABS
10.0000 mg | ORAL_TABLET | Freq: Three times a day (TID) | ORAL | Status: DC | PRN
  Administered 2013-09-20 (×2): 10 mg via ORAL
  Administered 2013-09-21: 20 mg via ORAL
  Administered 2013-09-21: 10 mg via ORAL
  Administered 2013-09-21: 20 mg via ORAL
  Administered 2013-09-22 – 2013-09-24 (×5): 10 mg via ORAL
  Filled 2013-09-19 (×5): qty 2
  Filled 2013-09-19 (×2): qty 4
  Filled 2013-09-19 (×4): qty 2

## 2013-09-19 MED ORDER — DEXAMETHASONE SODIUM PHOSPHATE 10 MG/ML IJ SOLN
INTRAMUSCULAR | Status: AC
  Filled 2013-09-19: qty 1

## 2013-09-19 MED ORDER — DOCUSATE SODIUM 100 MG PO CAPS
100.0000 mg | ORAL_CAPSULE | Freq: Two times a day (BID) | ORAL | Status: DC
  Administered 2013-09-19 – 2013-09-24 (×10): 100 mg via ORAL
  Filled 2013-09-19 (×10): qty 1

## 2013-09-19 MED ORDER — LISINOPRIL 20 MG PO TABS
20.0000 mg | ORAL_TABLET | Freq: Every day | ORAL | Status: DC
  Administered 2013-09-19 – 2013-09-24 (×6): 20 mg via ORAL
  Filled 2013-09-19 (×6): qty 1

## 2013-09-19 MED ORDER — NEOSTIGMINE METHYLSULFATE 10 MG/10ML IV SOLN
INTRAVENOUS | Status: DC | PRN
  Administered 2013-09-19: 5 mg via INTRAVENOUS

## 2013-09-19 MED ORDER — FENTANYL CITRATE 0.05 MG/ML IJ SOLN
INTRAMUSCULAR | Status: DC | PRN
  Administered 2013-09-19 (×3): 50 ug via INTRAVENOUS
  Administered 2013-09-19: 150 ug via INTRAVENOUS
  Administered 2013-09-19 (×2): 25 ug via INTRAVENOUS
  Administered 2013-09-19 (×4): 50 ug via INTRAVENOUS

## 2013-09-19 MED ORDER — SODIUM CHLORIDE 0.9 % IJ SOLN: 3.0000 mL | INTRAMUSCULAR | Status: DC | PRN

## 2013-09-19 MED ORDER — EPHEDRINE SULFATE 50 MG/ML IJ SOLN
INTRAMUSCULAR | Status: AC
  Filled 2013-09-19: qty 1

## 2013-09-19 MED ORDER — OXYCODONE HCL 5 MG PO TABS
5.0000 mg | ORAL_TABLET | Freq: Once | ORAL | Status: AC | PRN
  Administered 2013-09-19: 5 mg via ORAL

## 2013-09-19 MED ORDER — POTASSIUM CHLORIDE IN NACL 20-0.9 MEQ/L-% IV SOLN
INTRAVENOUS | Status: DC
  Administered 2013-09-19: 21:00:00 via INTRAVENOUS
  Filled 2013-09-19 (×4): qty 1000

## 2013-09-19 MED ORDER — OXYCODONE HCL 20 MG PO TABS
10.0000 mg | ORAL_TABLET | Freq: Three times a day (TID) | ORAL | Status: DC | PRN
  Filled 2013-09-19: qty 1

## 2013-09-19 MED ORDER — ATORVASTATIN CALCIUM 40 MG PO TABS
40.0000 mg | ORAL_TABLET | Freq: Every day | ORAL | Status: DC
Start: 2013-09-19 — End: 2013-09-24
  Administered 2013-09-19 – 2013-09-24 (×6): 40 mg via ORAL
  Filled 2013-09-19 (×6): qty 1

## 2013-09-19 MED ORDER — OXYCODONE HCL 5 MG PO TABS
ORAL_TABLET | ORAL | Status: AC
  Filled 2013-09-19: qty 1

## 2013-09-19 MED ORDER — LIDOCAINE HCL (CARDIAC) 20 MG/ML IV SOLN
INTRAVENOUS | Status: AC
  Filled 2013-09-19: qty 5

## 2013-09-19 MED ORDER — ROCURONIUM BROMIDE 100 MG/10ML IV SOLN
INTRAVENOUS | Status: DC | PRN
  Administered 2013-09-19: 20 mg via INTRAVENOUS
  Administered 2013-09-19: 50 mg via INTRAVENOUS

## 2013-09-19 MED ORDER — CYCLOBENZAPRINE HCL 10 MG PO TABS
10.0000 mg | ORAL_TABLET | Freq: Three times a day (TID) | ORAL | Status: DC | PRN
  Administered 2013-09-21 – 2013-09-23 (×2): 10 mg via ORAL
  Filled 2013-09-19 (×3): qty 1

## 2013-09-19 MED ORDER — LIDOCAINE-EPINEPHRINE 1 %-1:100000 IJ SOLN
INTRAMUSCULAR | Status: DC | PRN
  Administered 2013-09-19: 10 mL

## 2013-09-19 MED ORDER — VITAMIN D (ERGOCALCIFEROL) 1.25 MG (50000 UNIT) PO CAPS
50000.0000 [IU] | ORAL_CAPSULE | ORAL | Status: DC
  Filled 2013-09-19: qty 1

## 2013-09-19 MED ORDER — SODIUM CHLORIDE 0.9 % IJ SOLN
3.0000 mL | Freq: Two times a day (BID) | INTRAMUSCULAR | Status: DC
  Administered 2013-09-19 – 2013-09-21 (×2): 3 mL via INTRAVENOUS

## 2013-09-19 MED ORDER — VECURONIUM BROMIDE 10 MG IV SOLR
INTRAVENOUS | Status: AC
  Filled 2013-09-19: qty 10

## 2013-09-19 MED ORDER — FENOFIBRATE 160 MG PO TABS
160.0000 mg | ORAL_TABLET | Freq: Every day | ORAL | Status: DC
  Administered 2013-09-19 – 2013-09-24 (×5): 160 mg via ORAL
  Filled 2013-09-19 (×5): qty 1

## 2013-09-19 MED ORDER — OXYCODONE HCL 5 MG/5ML PO SOLN: 5.0000 mg | Freq: Once | ORAL | Status: AC | PRN

## 2013-09-19 MED ORDER — ARTIFICIAL TEARS OP OINT
TOPICAL_OINTMENT | OPHTHALMIC | Status: AC
Start: 2013-09-19 — End: 2013-09-19
  Filled 2013-09-19: qty 3.5

## 2013-09-19 MED ORDER — NEOSTIGMINE METHYLSULFATE 10 MG/10ML IV SOLN
INTRAVENOUS | Status: AC
  Filled 2013-09-19: qty 1

## 2013-09-19 MED ORDER — SODIUM CHLORIDE 0.9 % IR SOLN
Status: DC | PRN
  Administered 2013-09-19: 12:00:00

## 2013-09-19 MED ORDER — SODIUM CHLORIDE 0.9 % IV SOLN: 250.0000 mL | INTRAVENOUS | Status: DC

## 2013-09-19 MED ORDER — SODIUM CHLORIDE 0.9 % IJ SOLN
INTRAMUSCULAR | Status: AC
  Filled 2013-09-19: qty 10

## 2013-09-19 MED ORDER — OXYCODONE-ACETAMINOPHEN 5-325 MG PO TABS
1.0000 | ORAL_TABLET | ORAL | Status: DC | PRN
  Administered 2013-09-20 – 2013-09-23 (×6): 2 via ORAL
  Filled 2013-09-19 (×6): qty 2

## 2013-09-19 MED ORDER — CEFAZOLIN SODIUM 1-5 GM-% IV SOLN
1.0000 g | Freq: Three times a day (TID) | INTRAVENOUS | Status: AC
  Administered 2013-09-19 – 2013-09-20 (×2): 1 g via INTRAVENOUS
  Filled 2013-09-19 (×2): qty 50

## 2013-09-19 MED ORDER — PROPOFOL 10 MG/ML IV BOLUS
INTRAVENOUS | Status: DC | PRN
  Administered 2013-09-19: 50 mg via INTRAVENOUS
  Administered 2013-09-19: 200 mg via INTRAVENOUS
  Administered 2013-09-19: 50 mg via INTRAVENOUS

## 2013-09-19 MED ORDER — OXYCODONE HCL ER 10 MG PO T12A
20.0000 mg | EXTENDED_RELEASE_TABLET | Freq: Every day | ORAL | Status: DC
  Administered 2013-09-19 – 2013-09-23 (×5): 20 mg via ORAL
  Filled 2013-09-19 (×5): qty 2

## 2013-09-19 MED ORDER — GLYCOPYRROLATE 0.2 MG/ML IJ SOLN
INTRAMUSCULAR | Status: DC | PRN
  Administered 2013-09-19: .6 mg via INTRAVENOUS

## 2013-09-19 MED ORDER — PROPOFOL 10 MG/ML IV BOLUS
INTRAVENOUS | Status: AC
Start: 2013-09-19 — End: 2013-09-19
  Filled 2013-09-19: qty 20

## 2013-09-19 MED ORDER — 0.9 % SODIUM CHLORIDE (POUR BTL) OPTIME
TOPICAL | Status: DC | PRN
  Administered 2013-09-19: 1000 mL

## 2013-09-19 MED ORDER — BUPIVACAINE HCL (PF) 0.25 % IJ SOLN
INTRAMUSCULAR | Status: DC | PRN
  Administered 2013-09-19: 10 mL

## 2013-09-19 MED ORDER — HYDROMORPHONE HCL PF 1 MG/ML IJ SOLN
0.2500 mg | INTRAMUSCULAR | Status: DC | PRN
  Administered 2013-09-19 (×4): 0.5 mg via INTRAVENOUS

## 2013-09-19 MED ORDER — ACETAMINOPHEN 650 MG RE SUPP
650.0000 mg | RECTAL | Status: DC | PRN
Start: 2013-09-19 — End: 2013-09-24

## 2013-09-19 MED ORDER — ALBUMIN HUMAN 5 % IV SOLN
INTRAVENOUS | Status: DC | PRN
  Administered 2013-09-19: 15:00:00 via INTRAVENOUS

## 2013-09-19 MED ORDER — TRIAMTERENE-HCTZ 75-50 MG PO TABS
1.0000 | ORAL_TABLET | Freq: Every day | ORAL | Status: DC
  Administered 2013-09-20 – 2013-09-24 (×5): 1 via ORAL
  Filled 2013-09-19 (×6): qty 1

## 2013-09-19 MED ORDER — ONDANSETRON HCL 4 MG/2ML IJ SOLN
4.0000 mg | INTRAMUSCULAR | Status: DC | PRN
  Administered 2013-09-19 – 2013-09-20 (×3): 4 mg via INTRAVENOUS
  Filled 2013-09-19 (×3): qty 2

## 2013-09-19 MED ORDER — VECURONIUM BROMIDE 10 MG IV SOLR
INTRAVENOUS | Status: DC | PRN
  Administered 2013-09-19 (×6): 2 mg via INTRAVENOUS

## 2013-09-19 MED ORDER — ETODOLAC 400 MG PO TABS
400.0000 mg | ORAL_TABLET | Freq: Two times a day (BID) | ORAL | Status: DC
  Administered 2013-09-20 – 2013-09-24 (×9): 400 mg via ORAL
  Filled 2013-09-19 (×14): qty 1

## 2013-09-19 MED ORDER — SUCCINYLCHOLINE CHLORIDE 20 MG/ML IJ SOLN
INTRAMUSCULAR | Status: DC | PRN
  Administered 2013-09-19: 120 mg via INTRAVENOUS

## 2013-09-19 MED ORDER — GLYCOPYRROLATE 0.2 MG/ML IJ SOLN
INTRAMUSCULAR | Status: AC
  Filled 2013-09-19: qty 3

## 2013-09-19 MED ORDER — CEFAZOLIN SODIUM 1-5 GM-% IV SOLN
INTRAVENOUS | Status: AC
  Filled 2013-09-19: qty 50

## 2013-09-19 MED ORDER — ARTIFICIAL TEARS OP OINT
TOPICAL_OINTMENT | OPHTHALMIC | Status: DC | PRN
  Administered 2013-09-19: 1 via OPHTHALMIC

## 2013-09-19 MED ORDER — MIDAZOLAM HCL 2 MG/2ML IJ SOLN
INTRAMUSCULAR | Status: AC
  Filled 2013-09-19: qty 2

## 2013-09-19 MED ORDER — DIPHENHYDRAMINE HCL 50 MG/ML IJ SOLN
INTRAMUSCULAR | Status: DC | PRN
  Administered 2013-09-19: 25 mg via INTRAVENOUS

## 2013-09-19 MED ORDER — PHENOL 1.4 % MT LIQD: 1.0000 | OROMUCOSAL | Status: DC | PRN

## 2013-09-19 MED ORDER — ROCURONIUM BROMIDE 50 MG/5ML IV SOLN
INTRAVENOUS | Status: AC
Start: 2013-09-19 — End: 2013-09-19
  Filled 2013-09-19: qty 2

## 2013-09-19 MED ORDER — MENTHOL 3 MG MT LOZG: 1.0000 | LOZENGE | OROMUCOSAL | Status: DC | PRN

## 2013-09-19 MED ORDER — PROPOFOL 10 MG/ML IV BOLUS
INTRAVENOUS | Status: AC
  Filled 2013-09-19: qty 20

## 2013-09-19 MED ORDER — SENNA 8.6 MG PO TABS
1.0000 | ORAL_TABLET | Freq: Two times a day (BID) | ORAL | Status: DC
  Administered 2013-09-19 – 2013-09-24 (×8): 8.6 mg via ORAL
  Filled 2013-09-19 (×9): qty 1

## 2013-09-19 MED ORDER — PROMETHAZINE HCL 25 MG/ML IJ SOLN: 6.2500 mg | INTRAMUSCULAR | Status: DC | PRN

## 2013-09-19 MED ORDER — ACETAMINOPHEN 325 MG PO TABS
650.0000 mg | ORAL_TABLET | ORAL | Status: DC | PRN
Start: 2013-09-19 — End: 2013-09-24

## 2013-09-19 MED ORDER — ASPIRIN 325 MG PO TABS
325.0000 mg | ORAL_TABLET | Freq: Every day | ORAL | Status: DC
  Administered 2013-09-19 – 2013-09-24 (×6): 325 mg via ORAL
  Filled 2013-09-19 (×6): qty 1

## 2013-09-19 MED ORDER — LACTATED RINGERS IV SOLN
INTRAVENOUS | Status: DC | PRN
  Administered 2013-09-19 (×4): via INTRAVENOUS

## 2013-09-19 MED ORDER — STERILE WATER FOR INJECTION IJ SOLN
INTRAMUSCULAR | Status: AC
  Filled 2013-09-19: qty 10

## 2013-09-19 MED ORDER — SUCCINYLCHOLINE CHLORIDE 20 MG/ML IJ SOLN
INTRAMUSCULAR | Status: AC
  Filled 2013-09-19: qty 1

## 2013-09-19 SURGICAL SUPPLY — 85 items
ADH SKN CLS APL DERMABOND .7 (GAUZE/BANDAGES/DRESSINGS)
ADH SKN CLS LQ APL DERMABOND (GAUZE/BANDAGES/DRESSINGS) ×1
ALLOSTEM STRIP 20MMX50MM (Tissue) ×1 IMPLANT
APL SKNCLS STERI-STRIP NONHPOA (GAUZE/BANDAGES/DRESSINGS) ×1
BAG DECANTER FOR FLEXI CONT (MISCELLANEOUS) ×2 IMPLANT
BENZOIN TINCTURE PRP APPL 2/3 (GAUZE/BANDAGES/DRESSINGS) ×2 IMPLANT
BLADE SURG 11 STRL SS (BLADE) ×2 IMPLANT
BONE ALLOSTEM MORSELIZED 5CC (Bone Implant) ×2 IMPLANT
BRUSH SCRUB EZ PLAIN DRY (MISCELLANEOUS) ×2 IMPLANT
BUR MATCHSTICK NEURO 3.0 LAGG (BURR) ×3 IMPLANT
BUR PRECISION FLUTE 6.0 (BURR) ×2 IMPLANT
CAGE RISE 11-17-15 10X26 (Cage) ×4 IMPLANT
CANISTER SUCT 3000ML (MISCELLANEOUS) ×2 IMPLANT
CAP LOCKING THREADED (Cap) ×8 IMPLANT
CONT SPEC 4OZ CLIKSEAL STRL BL (MISCELLANEOUS) ×4 IMPLANT
CORDS BIPOLAR (ELECTRODE) ×1 IMPLANT
COVER TABLE BACK 60X90 (DRAPES) ×2 IMPLANT
DECANTER SPIKE VIAL GLASS SM (MISCELLANEOUS) ×2 IMPLANT
DERMABOND ADHESIVE PROPEN (GAUZE/BANDAGES/DRESSINGS) ×1
DERMABOND ADVANCED (GAUZE/BANDAGES/DRESSINGS)
DERMABOND ADVANCED .7 DNX12 (GAUZE/BANDAGES/DRESSINGS) IMPLANT
DERMABOND ADVANCED .7 DNX6 (GAUZE/BANDAGES/DRESSINGS) IMPLANT
DRAPE C-ARM 42X72 X-RAY (DRAPES) ×4 IMPLANT
DRAPE LAPAROTOMY 100X72X124 (DRAPES) ×2 IMPLANT
DRAPE POUCH INSTRU U-SHP 10X18 (DRAPES) ×2 IMPLANT
DRAPE PROXIMA HALF (DRAPES) ×1 IMPLANT
DRAPE SURG 17X23 STRL (DRAPES) ×2 IMPLANT
DRSG OPSITE 4X5.5 SM (GAUZE/BANDAGES/DRESSINGS) ×3 IMPLANT
DRSG OPSITE POSTOP 4X10 (GAUZE/BANDAGES/DRESSINGS) ×1 IMPLANT
DURAPREP 26ML APPLICATOR (WOUND CARE) ×2 IMPLANT
ELECT BLADE 4.0 EZ CLEAN MEGAD (MISCELLANEOUS) ×2
ELECT REM PT RETURN 9FT ADLT (ELECTROSURGICAL) ×2
ELECTRODE BLDE 4.0 EZ CLN MEGD (MISCELLANEOUS) IMPLANT
ELECTRODE REM PT RTRN 9FT ADLT (ELECTROSURGICAL) ×1 IMPLANT
EVACUATOR 3/16  PVC DRAIN (DRAIN) ×1
EVACUATOR 3/16 PVC DRAIN (DRAIN) ×1 IMPLANT
GAUZE SPONGE 4X4 12PLY STRL (GAUZE/BANDAGES/DRESSINGS) ×2 IMPLANT
GAUZE SPONGE 4X4 16PLY XRAY LF (GAUZE/BANDAGES/DRESSINGS) ×1 IMPLANT
GLOVE BIO SURGEON STRL SZ 6.5 (GLOVE) ×1 IMPLANT
GLOVE BIO SURGEON STRL SZ8 (GLOVE) ×4 IMPLANT
GLOVE BIOGEL PI IND STRL 6.5 (GLOVE) IMPLANT
GLOVE BIOGEL PI IND STRL 7.0 (GLOVE) IMPLANT
GLOVE BIOGEL PI IND STRL 7.5 (GLOVE) ×2 IMPLANT
GLOVE BIOGEL PI INDICATOR 6.5 (GLOVE) ×1
GLOVE BIOGEL PI INDICATOR 7.0 (GLOVE) ×3
GLOVE BIOGEL PI INDICATOR 7.5 (GLOVE) ×2
GLOVE ECLIPSE 7.5 STRL STRAW (GLOVE) IMPLANT
GLOVE ECLIPSE 8.5 STRL (GLOVE) ×1 IMPLANT
GLOVE EXAM NITRILE LRG STRL (GLOVE) IMPLANT
GLOVE EXAM NITRILE MD LF STRL (GLOVE) IMPLANT
GLOVE EXAM NITRILE XL STR (GLOVE) IMPLANT
GLOVE EXAM NITRILE XS STR PU (GLOVE) IMPLANT
GLOVE INDICATOR 8.5 STRL (GLOVE) ×4 IMPLANT
GLOVE SS BIOGEL STRL SZ 6.5 (GLOVE) IMPLANT
GLOVE SUPERSENSE BIOGEL SZ 6.5 (GLOVE) ×2
GLOVE SURG SS PI 7.0 STRL IVOR (GLOVE) ×2 IMPLANT
GOWN STRL REUS W/ TWL LRG LVL3 (GOWN DISPOSABLE) IMPLANT
GOWN STRL REUS W/ TWL XL LVL3 (GOWN DISPOSABLE) ×2 IMPLANT
GOWN STRL REUS W/TWL 2XL LVL3 (GOWN DISPOSABLE) IMPLANT
GOWN STRL REUS W/TWL LRG LVL3 (GOWN DISPOSABLE) ×6
GOWN STRL REUS W/TWL XL LVL3 (GOWN DISPOSABLE) ×6
KIT BASIN OR (CUSTOM PROCEDURE TRAY) ×2 IMPLANT
KIT ROOM TURNOVER OR (KITS) ×2 IMPLANT
NDL HYPO 25X1 1.5 SAFETY (NEEDLE) ×1 IMPLANT
NEEDLE HYPO 25X1 1.5 SAFETY (NEEDLE) ×2 IMPLANT
NS IRRIG 1000ML POUR BTL (IV SOLUTION) ×2 IMPLANT
PACK LAMINECTOMY NEURO (CUSTOM PROCEDURE TRAY) ×2 IMPLANT
PAD ARMBOARD 7.5X6 YLW CONV (MISCELLANEOUS) ×10 IMPLANT
ROD CREO 100MM SPINAL (Rod) ×1 IMPLANT
ROD CREO 125MM SPINAL (Rod) ×1 IMPLANT
SCREW SPINE CREO AMP 6.5X50 (Screw) ×2 IMPLANT
SPACER CROSSLINK 58-70 (Spacer) ×1 IMPLANT
SPONGE LAP 4X18 X RAY DECT (DISPOSABLE) IMPLANT
SPONGE SURGIFOAM ABS GEL 100 (HEMOSTASIS) ×4 IMPLANT
STRIP CLOSURE SKIN 1/2X4 (GAUZE/BANDAGES/DRESSINGS) ×4 IMPLANT
SUT VIC AB 0 CT1 18XCR BRD8 (SUTURE) ×2 IMPLANT
SUT VIC AB 0 CT1 8-18 (SUTURE) ×4
SUT VIC AB 2-0 CT1 18 (SUTURE) ×3 IMPLANT
SUT VICRYL 4-0 PS2 18IN ABS (SUTURE) ×2 IMPLANT
SYR 20ML ECCENTRIC (SYRINGE) ×2 IMPLANT
TOWEL OR 17X24 6PK STRL BLUE (TOWEL DISPOSABLE) ×2 IMPLANT
TOWEL OR 17X26 10 PK STRL BLUE (TOWEL DISPOSABLE) ×2 IMPLANT
TRAY FOLEY CATH 16FRSI W/METER (SET/KITS/TRAYS/PACK) ×1 IMPLANT
TULIP CREP AMP 5.5MM (Orthopedic Implant) ×4 IMPLANT
WATER STERILE IRR 1000ML POUR (IV SOLUTION) ×2 IMPLANT

## 2013-09-19 NOTE — Transfer of Care (Signed)
Immediate Anesthesia Transfer of Care Note  Patient: [name]  Procedure(s) Performed: Procedure(s): LUMBAR THREE-FOUR POSTERIOR LUMBAR  FUSION WITH HARDWARE REMOVAL (N/A)  Patient Location: PACU  Anesthesia Type:General  Level of Consciousness: awake and alert   Airway & Oxygen Therapy: Patient Spontanous Breathing and Patient connected to nasal cannula oxygen  Post-op Assessment: Report given to PACU RN, Post -op Vital signs reviewed and stable and Patient moving all extremities  Post vital signs: Reviewed and stable  Complications: Patient complains of left shoulder pain.  Dr. Wynetta Emery and Dr. Krista Blue aware.  Dr. Wynetta Emery ordered x-rays

## 2013-09-19 NOTE — H&P (Signed)
Christepher Melchior is an 58 y.o. male.   Chief Complaint: Back and left leg pain HPI: Patient is a pleasant 58 year old woman has had long-standing back pain following a work-related injury underwent L1-L3 posterior lateral fusion initially did very well however start experiencing severe left leg pain in a different distribution that began after an injury 4 days after turned to work. Patient refractory to all forms of conservative workup revealed progressive degeneration deterioration of L3-4 and due to patient's progression of clinical syndrome and imaging findings and takes her treatment I recommended an L3-4 interbody fusion extending his fusion down. Extensively went over the risks and benefits of the operation the patient as well as perioperative course and expectations of outcome and alternatives of surgery he understands and agrees to proceed forward.  Past Medical History  Diagnosis Date  . Degenerative disk disease   . Chronic back pain     HNP/stenosis  . Depression   . Difficult intubation     dx 05/14/07 [facility]); has anterior larynx, small mouth; glidescope #3 used in 2012 ([facility]PR)  . Hyperlipidemia     takes Atorvastatin daily  . Hypertension     takes Maxzide and Lisinopril daily as well as Bystolic  . Myocardial infarct 1999  . Joint pain   . Joint swelling   . History of colon polyps   . History of kidney stones   . Enlarged prostate     slightly  . Vitamin D deficiency     takes Vit D on Weds    Past Surgical History  Procedure Laterality Date  . Elbow surgery Bilateral   . Tonsillectomy    . Carpal tunnel release Right 2006    had nerve impingement  . Achilles tendon surgery Left     reattached   . Shoulder arthroscopy with rotator cuff repair Right 01/2012  . Joint replacement Bilateral 2012    (R) 03/2000: (L) 10/12  . Back surgery      x 2  . Colonoscopy    . Esophagogastroduodenoscopy      History reviewed. No pertinent family history. Social History:  reports  that he quit smoking about 15 years ago. His smoking use included Cigarettes. He has a 7 pack-year smoking history. He has never used smokeless tobacco. He reports that he does not drink alcohol or use illicit drugs.  Allergies: No Known Allergies  Medications Prior to Admission  Medication Sig Dispense Refill  . aspirin 325 MG tablet Take 325 mg by mouth daily.      Marland Kitchen atorvastatin (LIPITOR) 40 MG tablet Take 40 mg by mouth daily.      . Choline Fenofibrate (TRILIPIX) 135 MG capsule Take 135 mg by mouth daily.      Marland Kitchen etodolac (LODINE) 400 MG tablet Take 400 mg by mouth 2 (two) times daily.      Marland Kitchen lisinopril (PRINIVIL,ZESTRIL) 20 MG tablet Take 20 mg by mouth daily.      . nebivolol (BYSTOLIC) 5 MG tablet Take 5 mg by mouth daily.      . OxyCODONE (OXYCONTIN) 20 mg T12A 12 hr tablet Take 20 mg by mouth at bedtime.       . Oxycodone HCl 20 MG TABS Take 10-20 mg by mouth 3 (three) times daily as needed (pain).      . triamterene-hydrochlorothiazide (MAXZIDE) 75-50 MG per tablet Take 1 tablet by mouth daily.      . Vitamin D, Ergocalciferol, (DRISDOL) 50000 UNITS CAPS capsule Take 50,000 Units by  mouth every 7 (seven) days. Takes on wednesday        No results found for this or any previous visit (from the past 48 hour(s)). No results found.  Review of Systems  Constitutional: Negative.   HENT: Negative.   Eyes: Negative.   Respiratory: Negative.   Cardiovascular: Negative.   Gastrointestinal: Negative.   Genitourinary: Negative.   Musculoskeletal: Positive for back pain and myalgias.  Skin: Negative.   Neurological: Positive for sensory change.  Psychiatric/Behavioral: Negative.     Blood pressure 138/72, pulse 75, temperature 97.7 F (36.5 C), temperature source Oral, resp. rate 20, height  (1.854 m), weight 185.022 kg (407 lb 14.4 oz), SpO2 97.00%. Physical Exam  Constitutional: He is oriented to person, place, and time. He appears well-developed and well-nourished.  HENT:   Head: Normocephalic.  Eyes: Pupils are equal, round, and reactive to light.  Neck: Normal range of motion.  Respiratory: Effort normal.  GI: Soft.  Neurological: He is alert and oriented to person, place, and time. He has normal strength. GCS eye subscore is 4. GCS verbal subscore is 5. GCS motor subscore is 6.  Strength is 5 out of 5 in his iliopsoas, quads, hip she's, gastrocs tibialis, and EHL.  Skin: Skin is warm and dry.     Assessment/Plan 58 years old and presents for L3-4 posterior lumbar interbody fusion exposure fusion removal of hardware L1-L3  Kadian Barcellos P 09/19/2013, 10:58 AM

## 2013-09-19 NOTE — Anesthesia Preprocedure Evaluation (Addendum)
Anesthesia Evaluation    Reviewed: Allergy & Precautions, H&P , NPO status , Patient's Chart, lab work & pertinent test results  History of Anesthesia Complications (+) DIFFICULT AIRWAY and history of anesthetic complications  Airway       Dental   Pulmonary former smoker,          Cardiovascular hypertension, + Past MI     Neuro/Psych PSYCHIATRIC DISORDERS Depression negative neurological ROS     GI/Hepatic negative GI ROS, Neg liver ROS,   Endo/Other  negative endocrine ROS  Renal/GU negative Renal ROS     Musculoskeletal  (+) Arthritis -,   Abdominal   Peds  Hematology   Anesthesia Other Findings   Reproductive/Obstetrics                          Anesthesia Physical Anesthesia Plan  ASA: III  Anesthesia Plan: General   Post-op Pain Management:    Induction: Intravenous  Airway Management Planned:   Additional Equipment:   Intra-op Plan:   Post-operative Plan:   Informed Consent:   Plan Discussed with:   Anesthesia Plan Comments:         Anesthesia Quick Evaluation

## 2013-09-19 NOTE — Anesthesia Postprocedure Evaluation (Signed)
Anesthesia Post Note  Patient: [name]  Procedure(s) Performed: Procedure(s) (LRB): LUMBAR THREE-FOUR POSTERIOR LUMBAR  FUSION WITH HARDWARE REMOVAL (N/A)  Anesthesia type: general  Patient location: PACU  Post pain: Pain level controlled  Post assessment: Patient's Cardiovascular Status Stable  Last Vitals:  Filed Vitals:   09/19/13 1730  BP:   Pulse: 97  Temp:   Resp: 21    Post vital signs: Reviewed and stable  Level of consciousness: sedated  Complications: No apparent anesthesia complications

## 2013-09-19 NOTE — Op Note (Signed)
Preoperative diagnosis: Severe lumbar spinal stenosis L3-4 with severe lateral recess stenosis herniated nucleus pulposus and bilateral L3 and L4 radiculopathies with instability  Postoperative diagnosis: Same  Procedure: Redo decompressive lumbar laminectomy L3-4 with complete medial facetectomies radical foraminotomies in excess and requiring more work than would be needed with a standard interbody fusion  #2 posterior lumbar interbody fusion using the globus rise expandable peek system with local autograft mixed with Allostem morsels and strips  #3 exploration of fusion removal of hardware L1-L3 with pedicle screw placement at L4 and nonsegmental fixation L1-L4  #4 posterior lateral arthrodesis L3-4 using locally harvested autograft mixed with a morsels and strips  Placement of large Hemovac drain  Surgeon: Jillyn Hidden Jarone Ostergaard  Assistant: Sherilyn Cooter pool  Anesthesia: Gen.  EBL: 500  History of present illness: Patient 58 year old gentleman who sustained a work-related injury resulting in an L1-L3 posterior lateral fusion with decompression patient went back to work sustained another injury resulting in a herniated disc and severe progressive spinal stenosis at L3-4. The patient takes her treatment imaging findings and progression of clinical syndrome I recommended redo decompressive laminectomy from L3 down L4 posterior lumbar in by fusion L3-4 extensively reviewed the risks and benefits of the operation the patient as well as perioperative course expectations of outcome and alternatives of surgery and he understood and agreed to proceed forward.  Operative procedure: Patient brought into the or was induced under general anesthesia and positioned prone apply rolls with hip rolls his old incision was opened up and extended caudally the scar tissues dissected free and subperiosteal dissections care on the residual lamina of L3 the scar tissues dissected free exposing 34 disc space and a laminotomy was  begun working through the scar extending up to the level of the L3 pedicle the L3 nerve at L4 nerve were identified there was marked displacement of the L3 lamina into the thecal sac and this was densely fibrotic after this was freed up and removed this decompresses central canal complete medial facetectomies were performed a radical foraminotomies of the L3 and L4 nerve roots bilaterally at the decompression is no further stenosis on the L3 or L4 nerve roots or centrally disc spaces and cleanout radically bilaterally with suture rongeurs [place] curettes and paddle shavers. After adequate endplate preparation been achieved with a size 11 distractor was in place a 10 expandable cage was inserted this had good apposition the endplates and was expanded proximal and 4 turns then working on the contralateral side after adequate endplate preparation achieved and central disc was removed local autograft was packed centrally and a contralateral cages placed a similar fashion under fluoroscopy. There was a large extra foraminal disc herniation underneath the L3 nerve on the patient's left side this is felt to be symptomatic. This is all removed decompressing the L3 nerve root foramen then testing of pedicle screw placement using a high-speed drill sequential tabs 5565 and 6 5 x 50 screws the screws were inserted L4 the fusion from L1-L3 was inspected this was solid posterior laterally so the nuts removed across it was removed the rods removed I left all screws in place placed longer rods anchor all loose in place and then aggressively drilled out the lateral cortex of the facet complex and TPS from L3-L4 and replaced local autograft and the Allis stem strips posterior laterally. Crossing was applied the foraminal reinspected confirm patency no migration of graft material Gelfoam was overlaid top of the dura a large Hemovac drain was placed and the wounds  closed in layers with after Vicryl a running 4 subcuticular and skin  Dermabond benzo and Steri-Strips sterile dressings were applied patient recovered in stable condition. At the end of case on it counts sponge counts were correct the

## 2013-09-19 NOTE — Anesthesia Procedure Notes (Signed)
Procedure Name: Intubation Date/Time: 09/19/2013 1:18 AM Performed by: Fransisca Kaufmann Pre-anesthesia Checklist: Patient identified, Emergency Drugs available, Suction available, Patient being monitored and Timeout performed Patient Re-evaluated:Patient Re-evaluated prior to inductionOxygen Delivery Method: Circle system utilized Preoxygenation: Pre-oxygenation with 100% oxygen Intubation Type: IV induction Ventilation: Two handed mask ventilation required Grade View: Grade III Tube type: Oral Tube size: 8.0 mm Number of attempts: 2 Airway Equipment and Method: Stylet and Video-laryngoscopy Placement Confirmation: ETT inserted through vocal cords under direct vision,  positive ETCO2 and breath sounds checked- equal and bilateral Secured at: 23 cm Dental Injury: Teeth and Oropharynx as per pre-operative assessment  Difficulty Due To: Difficulty was anticipated, Difficult Airway- due to large tongue, Difficult Airway- due to reduced neck mobility, Difficult Airway- due to anterior larynx and Difficult Airway- due to limited oral opening Future Recommendations: Recommend- induction with short-acting agent, and alternative techniques readily available Comments: DL x 2 with glidescope/ # 4 LMA placed between attempts/# 8.0 ott placed 2nd attempt by Dr Krista Blue

## 2013-09-20 ENCOUNTER — Encounter (HOSPITAL_COMMUNITY): Payer: Self-pay | Admitting: *Deleted

## 2013-09-20 LAB — COMPREHENSIVE METABOLIC PANEL
ALBUMIN: 3.3 g/dL — AB (ref 3.5–5.2)
ALK PHOS: 45 U/L (ref 39–117)
ALT: 37 U/L (ref 0–53)
ANION GAP: 17 — AB (ref 5–15)
AST: 85 U/L — ABNORMAL HIGH (ref 0–37)
BILIRUBIN TOTAL: 0.5 mg/dL (ref 0.3–1.2)
BUN: 27 mg/dL — AB (ref 6–23)
CHLORIDE: 97 meq/L (ref 96–112)
CO2: 23 meq/L (ref 19–32)
Calcium: 8.7 mg/dL (ref 8.4–10.5)
Creatinine, Ser: 0.99 mg/dL (ref 0.50–1.35)
GFR calc Af Amer: >90 mL/min (ref >90)
GFR, EST NON AFRICAN AMERICAN: 89 mL/min — AB (ref >90)
Glucose, Bld: 231 mg/dL — ABNORMAL HIGH (ref 70–99)
Potassium: 4.5 mEq/L (ref 3.7–5.3)
Sodium: 137 mEq/L (ref 137–147)
Total Protein: 6.3 g/dL (ref 6.0–8.3)

## 2013-09-20 MED ORDER — INFLUENZA VAC SPLIT QUAD 0.5 ML IM SUSY
0.5000 mL | PREFILLED_SYRINGE | INTRAMUSCULAR | Status: AC
  Administered 2013-09-22: 0.5 mL via INTRAMUSCULAR
  Filled 2013-09-20: qty 0.5

## 2013-09-20 MED ORDER — PROMETHAZINE HCL 25 MG/ML IJ SOLN
12.5000 mg | Freq: Four times a day (QID) | INTRAMUSCULAR | Status: DC | PRN
  Filled 2013-09-20: qty 1

## 2013-09-20 NOTE — Evaluation (Signed)
Physical Therapy Evaluation Patient Details Name: [name] MRN: [id number] DOB: 01/03/1956 Today's Date: 09/20/2013   History of Present Illness   58 year old  workup revealed progressive degeneration deterioration of L3-4 and due to patient's progression of clinical syndrome. Pt s/p L3-4 interbody fusion extending his fusion down. PMH: DDD, depression, chronic back pain, HTN, MI, Elbow surg, Carpal tunnel release, achilles tendon repair, Rt shoulder rotator cuff repair, back surg x2 BIL joint replacement  Clinical Impression  Patient is s/p above surgery resulting in the deficits listed below (see PT Problem List). Pt tolerate up OOB well for first time despite nausea. Patient will benefit from skilled PT to increase their independence and safety with mobility (while adhering to their precautions) to allow discharge. Pt desires [facility]-SNF due to pt being home alone during the day and having 2 sets of  9 steps to access home. Acute PT to follow to progress indep with mobility and back precautions.     Follow Up Recommendations SNF    Equipment Recommendations  None recommended by PT    Recommendations for Other Services       Precautions / Restrictions Precautions Precautions: Back Precaution Booklet Issued: Yes (comment) Precaution Comments: pt able to recall precautions from previous back surgeris Required Braces or Orthoses: Spinal Brace Spinal Brace: Lumbar corset;Applied in sitting position Restrictions Weight Bearing Restrictions: No      Mobility  Bed Mobility Overal bed mobility: Needs Assistance Bed Mobility: Rolling;Sidelying to Sit Rolling: Supervision (used bed rail) Sidelying to sit: Min assist;HOB elevated       General bed mobility comments: strong use of bedrail, good technique, v/c's to remind him of logroll  Transfers Overall transfer level: Needs assistance Equipment used: Rolling walker (2 wheeled) Transfers: Sit to/from Stand Sit to Stand: Min  assist;+2 physical assistance;+2 safety/equipment         General transfer comment: pt with good technique, increased time due to first time OOB  Ambulation/Gait Ambulation/Gait assistance: Min assist;+2 safety/equipment Ambulation Distance (Feet): 10 Feet Assistive device: Rolling walker (2 wheeled) Gait Pattern/deviations: Step-to pattern;Decreased stride length Gait velocity: slow   General Gait Details: pt unable to tolerate > 10 feet due to onset of back pain and fatigue  Stairs            Wheelchair Mobility    Modified Rankin (Stroke Patients Only)       Balance Overall balance assessment: Needs assistance Sitting-balance support: No upper extremity supported;Feet supported Sitting balance-Leahy Scale: Good     Standing balance support: No upper extremity supported Standing balance-Leahy Scale: Poor (requires atleast unilateral UE support for safe standing)                               Pertinent Vitals/Pain Pain Assessment: No/denies pain    Home Living Family/patient expects to be discharged to:: Skilled nursing facility (requesting [facility]) Living Arrangements: Spouse/significant other               Additional Comments: spouse works during the day, pt desires rehab    Prior Function Level of Independence: Independent         Comments: occasional use of RW when pts L LE feeling weaker than normal     Hand Dominance   Dominant Hand: Right    Extremity/Trunk Assessment   Upper Extremity Assessment: Overall WFL for tasks assessed           Lower Extremity Assessment: LLE  deficits/detail   LLE Deficits / Details: grossly 3+/5  Cervical / Trunk Assessment: Normal  Communication   Communication: No difficulties  Cognition Arousal/Alertness: Awake/alert Behavior During Therapy: WFL for tasks assessed/performed Overall Cognitive Status: Within Functional Limits for tasks assessed                       General Comments      Exercises        Assessment/Plan    PT Assessment Patient needs continued PT services  PT Diagnosis Difficulty walking;Generalized weakness;Acute pain   PT Problem List Decreased strength;Decreased activity tolerance;Decreased balance;Decreased mobility  PT Treatment Interventions DME instruction;Gait training;Therapeutic activities;Therapeutic exercise;Balance training;Functional mobility training;Stair training   PT Goals (Current goals can be found in the Care Plan section) Acute Rehab PT Goals Patient Stated Goal: to get better PT Goal Formulation: With patient Time For Goal Achievement: 09/27/13 Potential to Achieve Goals: Good    Frequency Min 5X/week   Barriers to discharge Decreased caregiver support alone during the day    Co-evaluation               End of Session Equipment Utilized During Treatment: Gait belt Activity Tolerance: Patient tolerated treatment well Patient left: in chair;with call bell/phone within reach Nurse Communication: Mobility status         Time: 0853-0920 PT Time Calculation (min): 27 min   Charges:   PT Evaluation $Initial PT Evaluation Tier I: 1 Procedure PT Treatments $Gait Training: 8-22 mins   PT G CodesMarcene Brawn 09/20/2013, 12:37 PM  Lewis Shock, PT, DPT Pager #: [phone] Office #: [phone]

## 2013-09-20 NOTE — Progress Notes (Signed)
UR complete.  Naudia Crosley RN, MSN 

## 2013-09-20 NOTE — Evaluation (Signed)
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 Pt s/p L3-4 interbody fusion extending his fusion down. PMH: DDD, depression, chronic back pain, HTN, MI, Elbow surg, Carpal tunnel release, achilles tendon repair, Rt shoulder rotator cuff repair, back surg x2 BIL joint replacement   Clinical Impression   Patient is s/p L3-4 Fusion surgery resulting in functional limitations due to the deficits listed below (see OT problem list). PTA independent using 4WW. Patient will benefit from skilled OT acutely to increase independence and safety with ADLS to allow discharge SNF. Ot to follow acutely for adls with back precautions.     Follow Up Recommendations  SNF    Equipment Recommendations  None recommended by OT (defer SNF)    Recommendations for Other Services       Precautions / Restrictions Precautions Precautions: Back Precaution Comments: handout present Required Braces or Orthoses: Spinal Brace Spinal Brace: Lumbar corset;Applied in sitting position      Mobility Bed Mobility               General bed mobility comments: in chair on arrival  Transfers Overall transfer level: Needs assistance Equipment used: Rolling walker (2 wheeled) Transfers: Sit to/from Stand Sit to Stand: Min guard;+2 safety/equipment         General transfer comment: pt able to use bil UE to push up from chair.    Balance Overall balance assessment: Needs assistance Sitting-balance support: No upper extremity supported;Feet supported Sitting balance-Leahy Scale: Good     Standing balance support: No upper extremity supported;During functional activity Standing balance-Leahy Scale: Poor                              ADL Overall ADL's :  Needs assistance/impaired     Grooming: Oral care;Standing Grooming Details (indicate cue type and reason): reports bil LE fatigue Upper Body Bathing: Moderate assistance;Sitting   Lower Body Bathing: Total assistance;Sit to/from stand Lower Body Bathing Details (indicate cue type and reason): pt using AE at baseline and familiar with use         Toilet Transfer: Min guard;Ambulation;RW;BSC (requries use of bil UE)           Functional mobility during ADLs: Min guard;Rolling walker General ADL Comments: pt with total+2 for safety due to report of bil LE fatigue. Pt unable to static stand for oral care.     Vision                     Perception     Praxis      Pertinent Vitals/Pain Pain Assessment: No/denies pain     Hand Dominance Right   Extremity/Trunk Assessment Upper Extremity Assessment Upper Extremity Assessment: Overall WFL for tasks assessed   Lower Extremity Assessment Lower Extremity Assessment: Defer to PT evaluation   Cervical / Trunk Assessment Cervical / Trunk Assessment: Normal   Communication Communication Communication: No difficulties   Cognition Arousal/Alertness: Awake/alert Behavior During Therapy: WFL for tasks assessed/performed Overall Cognitive Status: Within Functional Limits for tasks assessed                     General Comments       Exercises       Shoulder Instructions  Home Living Family/patient expects to be discharged to:: Skilled nursing facility (requesting [facility]) Living Arrangements: Spouse/significant other                                      Prior Functioning/Environment Level of Independence: Independent             OT Diagnosis: Generalized weakness;Acute pain   OT Problem List: Decreased strength;Decreased activity tolerance;Impaired balance (sitting and/or standing);Decreased safety awareness;Decreased knowledge of use of DME or AE;Decreased knowledge of  precautions;Decreased cognition;Pain   OT Treatment/Interventions: Self-care/ADL training;Therapeutic exercise;Therapeutic activities;Patient/family education;Balance training    OT Goals(Current goals can be found in the care plan section) Acute Rehab OT Goals Patient Stated Goal: to get better OT Goal Formulation: With patient Time For Goal Achievement: 10/04/13 Potential to Achieve Goals: Good  OT Frequency: Min 2X/week   Barriers to D/C:            Co-evaluation              End of Session Equipment Utilized During Treatment: Gait belt;Rolling walker;Back brace Nurse Communication: Mobility status;Precautions  Activity Tolerance: Patient tolerated treatment well Patient left: in chair;with call bell/phone within reach;with chair alarm set   Time: 8657-8469 OT Time Calculation (min): 15 min Charges:  OT General Charges $OT Visit: 1 Procedure OT Evaluation $Initial OT Evaluation Tier I: 1 Procedure OT Treatments $Self Care/Home Management : 8-22 mins G-Codes:    [name] 2013-09-24, 11:47 AM Pager: [phone]

## 2013-09-20 NOTE — Progress Notes (Signed)
Pt still nauseous and vomiting coffee brown vomit,requesting more pain medication after being medicated with iv dilaudid  at 0327, Dr Jordan Likes re paged, called back and said to continue monitoring, no new orders at this time, pt reassured,call light at bedside [name], [name] Efe

## 2013-09-20 NOTE — Progress Notes (Signed)
Pt c/o of nausea, vomited twice,dark coffee brown color, earlier had iv zofran  at 2228, still very nauseous, Dr Jordan Likes (on call) paged and notified, ordered to give iv phernegan  ,same given at 2344, pt reassuired, will however continue to monitor. Obasogie-Asidi, Brandee Markin Efe

## 2013-09-20 NOTE — Progress Notes (Signed)
Subjective: Patient reports Patient doing well no back pain but significant improvement in leg pain and numbness main complaint is nausea that began this morning  Objective: Vital signs in last 24 hours: Temp:  [97.7 F (36.5 C)-99.6 F (37.6 C)] 99.5 F (37.5 C) (09/14 2349) Pulse Rate:  [75-109] 98 (09/15 0121) Resp:  [15-24] 20 (09/15 0121) BP: (135-175)/(70-95) 158/85 mmHg (09/15 0121) SpO2:  [92 %-99 %] 94 % (09/15 0121) FiO2 (%):  [3 %] 3 % (09/14 1838) Weight:  [185.022 kg (407 lb 14.4 oz)] 185.022 kg (407 lb 14.4 oz) (09/14 0922)  Intake/Output from previous day: 09/14 0701 - 09/15 0700 In: 3600 [I.V.:3200; Blood:150; IV Piggyback:250] Out: 4320 [Urine:2235; Emesis/NG output:950; Drains:585; Blood:500] Intake/Output this shift: Total I/O In: -  Out: 200 [Emesis/NG output:200]  Awake alert oriented strength out of 5 wound clean dry and intact abdomen soft  Lab Results: No results found for this basename: WBC, HGB, HCT, PLT,  in the last 72 hours BMET No results found for this basename: NA, K, CL, CO2, GLUCOSE, BUN, CREATININE, CALCIUM,  in the last 72 hours  Studies/Results: Dg Lumbar Spine 2-3 Views  09/19/2013   CLINICAL DATA:  PLIF  EXAM: LUMBAR SPINE - 2-3 VIEW  COMPARISON:  06/02/2013  FINDINGS: Two spot films were obtained intraoperatively. There are pedicular screws noted at multiple levels which appear to the L1 through L4. Clinical correlation with the operative findings is recommended. Interbody fusion is noted at what appears to be L3-4   Electronically Signed   By: Alcide Clever M.D.   On: 09/19/2013 16:18   Dg Chest Port 1 View  09/19/2013   CLINICAL DATA:  Left-sided chest pain, shortness of breath  EXAM: PORTABLE CHEST - 1 VIEW  COMPARISON:  Prior chest x-ray 12/09/2012  FINDINGS: The patient is rotated toward the left. Cardiac and mediastinal contours remain within normal limits. Inspiratory volumes are slightly lower and there may be mild left basilar  subsegmental atelectasis. No evidence of pulmonary edema, pleural effusion, focal airspace consolidation or pneumothorax. No acute osseous abnormality.  IMPRESSION: Low inspiratory volumes with probable left basilar subsegmental atelectasis.   Electronically Signed   By: Malachy Moan M.D.   On: 09/19/2013 18:09   Dg Shoulder Left Port  09/19/2013   CLINICAL DATA:  Left shoulder pain  EXAM: LEFT SHOULDER - 1 VIEW  COMPARISON:  Concurrently obtained chest x-ray  FINDINGS: No acute fracture or malalignment. The glenohumeral joint remains located. Mild degenerative change at the acromioclavicular and glenohumeral joints. Bony mineralization is within normal limits. No lytic or blastic osseous lesion. Visualized thorax is unremarkable. Is  IMPRESSION: 1. No acute osseous abnormality. 2. Mild degenerative change at the acromioclavicular and glenohumeral joints.   Electronically Signed   By: Malachy Moan M.D.   On: 09/19/2013 18:10   Dg C-arm 1-60 Min  09/19/2013   CLINICAL DATA:  Lumbar fusion.  EXAM: DG C-ARM 61-120 MIN  COMPARISON:  None.  FINDINGS: Fluoroscopic spot films demonstrate multilevel pedicle screws and interbody fusion device. No complicating features.  IMPRESSION: Lumbar fusion hardware in good position without complicating features.   Electronically Signed   By: Loralie Champagne M.D.   On: 09/19/2013 16:20    Assessment/Plan: Mobilize physical outpatient therapy check like to light and liver function change nausea medicine and Phenergan  LOS: 1 day     Viktor Philipp P 09/20/2013, 7:38 AM

## 2013-09-20 NOTE — Clinical Social Work Note (Signed)
CSW consulted for short-term SNF placement at time of discharge.  CSW attempted to complete bedside assessment. Pt with current nausea and unable to participate in CSW bedside assessment. CSW will continue to follow and assist with discharge planning.  Marcelline Deist, LCSWA [phone]) Licensed Clinical Social Worker Neuroscience [phone]) and Medical ICU (45M)

## 2013-09-21 MED FILL — Heparin Sodium (Porcine) Inj 1000 Unit/ML: INTRAMUSCULAR | Qty: 30 | Status: AC

## 2013-09-21 MED FILL — Sodium Chloride Irrigation Soln 0.9%: Qty: 3000 | Status: AC

## 2013-09-21 MED FILL — Sodium Chloride IV Soln 0.9%: INTRAVENOUS | Qty: 1000 | Status: AC

## 2013-09-21 NOTE — Clinical Documentation Improvement (Signed)
Presents with spinal stenosis; had previous fusion and re-injured back. Had discectomy performed and removal of old hardware.   Patient's weight is 407 pounds  BMI is 53.9  Please provide a diagnosis associated with the above clinical data and render an opinion of in next progress note and discharge summary.  Thank You, Shellee Milo ,RN Clinical Documentation Specialist:  [phone]  [facility]- Health Information Management

## 2013-09-21 NOTE — Plan of Care (Signed)
Problem: Consults Goal: Diagnosis - Spinal Surgery Outcome: Completed/Met Date Met:  09/21/13 Thoraco/Lumbar Spine Fusion     

## 2013-09-21 NOTE — Progress Notes (Signed)
Subjective: Patient reports doing well with regard to pain requested looking into rehabilitation  Objective: Vital signs in last 24 hours: Temp:  [98 F (36.7 C)-99 F (37.2 C)] 98.6 F (37 C) (09/16 0548) Pulse Rate:  [76-103] 76 (09/16 0548) Resp:  [20] 20 (09/16 0548) BP: (114-168)/(62-77) 135/76 mmHg (09/16 0548) SpO2:  [95 %-99 %] 96 % (09/16 0548)  Intake/Output from previous day: 09/15 0701 - 09/16 0700 In: -  Out: 760 [Urine:500; Emesis/NG output:200; Drains:60] Intake/Output this shift:    strength out of 5 wound clean dry and intact  Lab Results: No results found for this basename: WBC, HGB, HCT, PLT,  in the last 72 hours BMET  Recent Labs  09/20/13 1050  NA 137  K 4.5  CL 97  CO2 23  GLUCOSE 231*  BUN 27*  CREATININE 0.99  CALCIUM 8.7    Studies/Results: Dg Lumbar Spine 2-3 Views  09/19/2013   CLINICAL DATA:  PLIF  EXAM: LUMBAR SPINE - 2-3 VIEW  COMPARISON:  06/02/2013  FINDINGS: Two spot films were obtained intraoperatively. There are pedicular screws noted at multiple levels which appear to the L1 through L4. Clinical correlation with the operative findings is recommended. Interbody fusion is noted at what appears to be L3-4   Electronically Signed   By: Alcide Clever M.D.   On: 09/19/2013 16:18   Dg Chest Port 1 View  09/19/2013   CLINICAL DATA:  Left-sided chest pain, shortness of breath  EXAM: PORTABLE CHEST - 1 VIEW  COMPARISON:  Prior chest x-ray 12/09/2012  FINDINGS: The patient is rotated toward the left. Cardiac and mediastinal contours remain within normal limits. Inspiratory volumes are slightly lower and there may be mild left basilar subsegmental atelectasis. No evidence of pulmonary edema, pleural effusion, focal airspace consolidation or pneumothorax. No acute osseous abnormality.  IMPRESSION: Low inspiratory volumes with probable left basilar subsegmental atelectasis.   Electronically Signed   By: Malachy Moan M.D.   On: 09/19/2013 18:09    Dg Shoulder Left Port  09/19/2013   CLINICAL DATA:  Left shoulder pain  EXAM: LEFT SHOULDER - 1 VIEW  COMPARISON:  Concurrently obtained chest x-ray  FINDINGS: No acute fracture or malalignment. The glenohumeral joint remains located. Mild degenerative change at the acromioclavicular and glenohumeral joints. Bony mineralization is within normal limits. No lytic or blastic osseous lesion. Visualized thorax is unremarkable. Is  IMPRESSION: 1. No acute osseous abnormality. 2. Mild degenerative change at the acromioclavicular and glenohumeral joints.   Electronically Signed   By: Malachy Moan M.D.   On: 09/19/2013 18:10   Dg C-arm 1-60 Min  09/19/2013   CLINICAL DATA:  Lumbar fusion.  EXAM: DG C-ARM 61-120 MIN  COMPARISON:  None.  FINDINGS: Fluoroscopic spot films demonstrate multilevel pedicle screws and interbody fusion device. No complicating features.  IMPRESSION: Lumbar fusion hardware in good position without complicating features.   Electronically Signed   By: Loralie Champagne M.D.   On: 09/19/2013 16:20    Assessment/Plan: Continue physical and occupational therapy towards transfer to short-term rehabilitation   LOS: 2 days     Ferron Ishmael P 09/21/2013, 7:45 AM

## 2013-09-21 NOTE — Clinical Social Work Psychosocial (Signed)
Clinical Social Work Department BRIEF PSYCHOSOCIAL ASSESSMENT 09/21/2013  Patient:  [name], [name]     Account Number:  [id number]     Admit date:  09/19/2013  Clinical Social Worker:  Delrae Sawyers  Date/Time:  09/21/2013 11:52 AM  Referred by:  Physician  Date Referred:  09/21/2013 Referred for  SNF Placement   Other Referral:   none.   Interview type:  Patient Other interview type:   none.    PSYCHOSOCIAL DATA Living Status:  WIFE Admitted from facility:   Level of care:   Primary support name:  Tonnie Friedel Primary support relationship to patient:  SPOUSE Degree of support available:   Strong support system.    CURRENT CONCERNS Current Concerns  Post-Acute Placement   Other Concerns:   none.    SOCIAL WORK ASSESSMENT / PLAN CSW received referral for SNF placement at time of discharge. CSW met with pt at bedside to discuss discharge disposition. Pt states he has previously completed short-term rehabilitation at [facility], but would prefer a facility close to family. Pt states his preference for [facility].    CSW to continue to follow and assist with discharge planning needs.   Assessment/plan status:  Psychosocial Support/Ongoing Assessment of Needs Other assessment/ plan:   none.   Information/referral to community resources:   [facility] bed offers.    PATIENT'S/FAMILY'S RESPONSE TO PLAN OF CARE: Pt understanding and agreeable to CSW plan of care. Pt expressed no further questions or concerns at this time.       Lubertha Sayres, [place] ([phone]) Licensed Clinical Social Worker Neuroscience [phone]) and Medical ICU (43M)

## 2013-09-21 NOTE — Clinical Social Work Placement (Signed)
Clinical Social Work Department CLINICAL SOCIAL WORK PLACEMENT NOTE 09/21/2013  Patient:  [name], [name]  Account Number:  [id number] Admit date:  09/19/2013  Clinical Social Worker:  Mosie Epstein  Date/time:  09/21/2013 11:58 AM  Clinical Social Work is seeking post-discharge placement for this patient at the following level of care:   SKILLED NURSING   (*CSW will update this form in Epic as items are completed)   09/21/2013  Patient/family provided with [facility] Department of Clinical Social Work's list of facilities offering this level of care within the geographic area requested by the patient (or if unable, by the patient's family).  09/21/2013  Patient/family informed of their freedom to choose among providers that offer the needed level of care, that participate in Medicare, Medicaid or managed care program needed by the patient, have an available bed and are willing to accept the patient.  09/21/2013  Patient/family informed of [facility]' ownership interest in [facility], as well as of the fact that they are under no obligation to receive care at this facility.  PASARR submitted to EDS on  PASARR number received on   FL2 transmitted to all facilities in geographic area requested by pt/family on  09/21/2013 FL2 transmitted to all facilities within larger geographic area on   Patient informed that his/her managed care company has contracts with or will negotiate with  certain facilities, including the following:     Patient/family informed of bed offers received:   Patient chooses bed at  Physician recommends and patient chooses bed at    Patient to be transferred to  on   Patient to be transferred to facility by  Patient and family notified of transfer on  Name of family member notified:    The following physician request were entered in Epic:   Additional Comments: PASARR previously existing.  Marcelline Deist, LCSWA [phone]) Licensed  Clinical Social Worker Neuroscience [phone]) and Medical ICU (15M)

## 2013-09-21 NOTE — Progress Notes (Signed)
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 Pt s/p L3-4 interbody fusion extending his fusion down. PMH: DDD, depression, chronic back pain, HTN, MI, Elbow surg, Carpal tunnel release, achilles tendon repair, Rt shoulder rotator cuff repair, back surg x2 BIL joint replacement    PT Comments    Pt progressing well however remains to have limited ambulation tolerance due to onset of back pain at surgical site. Pt con't to be heavily reliant on RW during standing and ambulation. L LE is stronger today compared to eval. Acute PT to con't to follow to progress mobility. Pt remains unsafe to d/c home alone as wife works during the day. Con't to recommend SNF upon d/c.  Follow Up Recommendations  SNF     Equipment Recommendations  None recommended by PT    Recommendations for Other Services       Precautions / Restrictions Precautions Precautions: Back Precaution Booklet Issued: Yes (comment) Precaution Comments: pt with good recall of prec, handout present on table Required Braces or Orthoses: Spinal Brace Spinal Brace: Lumbar corset;Applied in sitting position Restrictions Weight Bearing Restrictions: No    Mobility  Bed Mobility Overal bed mobility:  (pt up in chair)                Transfers Overall transfer level: Needs assistance Equipment used: Rolling walker (2 wheeled) Transfers: Sit to/from Stand Sit to Stand: Min assist         General transfer comment: v/c's to minimize trunk flexion, increased time  Ambulation/Gait Ambulation/Gait assistance: Min guard Ambulation Distance (Feet): 40 Feet Assistive device: Rolling walker (2 wheeled) Gait Pattern/deviations: Step-through pattern;Decreased stance time - left;Decreased step  length - left;Antalgic;Trunk flexed Gait velocity: slow   General Gait Details: distance limited by pain/pressure on incision site.    Stairs            Wheelchair Mobility    Modified Rankin (Stroke Patients Only)       Balance           Standing balance support: Bilateral upper extremity supported Standing balance-Leahy Scale: Poor (requires AD due to onset of back pain)                      Cognition Arousal/Alertness: Awake/alert Behavior During Therapy: WFL for tasks assessed/performed Overall Cognitive Status: Within Functional Limits for tasks assessed                      Exercises General Exercises - Lower Extremity Ankle Circles/Pumps: AROM;10 reps;Seated;Both Long Arc Quad: Left;10 reps;Seated (with 10 sec hold in extension) Heel Slides: AROM;10 reps;Seated;Left    General Comments        Pertinent Vitals/Pain Pain Assessment: 0-10 Pain Score: 3  (6/10 during amb) Pain Location: surgical site Pain Descriptors / Indicators: Pressure (on site) Pain Intervention(s): Monitored during session;Limited activity within patient's tolerance    Home Living                      Prior Function            PT Goals (current goals can now be found in the care plan section) Progress towards PT goals: Progressing toward goals    Frequency  Min 5X/week    PT Plan Current  plan remains appropriate    Co-evaluation             End of Session Equipment Utilized During Treatment: Gait belt Activity Tolerance: Patient tolerated treatment well Patient left: in chair;with call bell/phone within reach     Time: 0907-0930 PT Time Calculation (min): 23 min  Charges:  $Gait Training: 8-22 mins $Therapeutic Exercise: 8-22 mins                    G Codes:      Marcene Brawn 09/21/2013, 11:08 AM  Lewis Shock, PT, DPT Pager #: [phone] Office #: [phone]

## 2013-09-21 NOTE — Progress Notes (Signed)
Occupational Therapy Treatment Patient Details Name: [name] MRN: [id number] DOB: 1955/11/20 Today's Date: 09/21/2013    History of present illness  58 year old  workup revealed progressive degeneration deterioration of L3-4 and due to patient's progression of clinical syndrome. Pt s/p L3-4 interbody fusion extending his fusion down. PMH: DDD, depression, chronic back pain, HTN, MI, Elbow surg, Carpal tunnel release, achilles tendon repair, Rt shoulder rotator cuff repair, back surg x2 BIL joint replacement   OT comments  Pt required education this session on pain management and limited this session because of pain level. Pt agreeable to OOB for lunch. OT to continue to work with pt acutely on adl retraining and bed mobility,.  Follow Up Recommendations  SNF    Equipment Recommendations  None recommended by OT    Recommendations for Other Services      Precautions / Restrictions Precautions Precautions: Back Required Braces or Orthoses: Spinal Brace Spinal Brace: Lumbar corset;Applied in sitting position       Mobility Bed Mobility Overal bed mobility: Needs Assistance Bed Mobility: Supine to Sit     Supine to sit: Min guard     General bed mobility comments: pt using HOB to incr to 51 degrees and heavy use of bed rail. Pt educated to decr [facility] each time and begin to simulate home setup  Transfers Overall transfer level: Needs assistance Equipment used: Rolling walker (2 wheeled) Transfers: Sit to/from Stand Sit to Stand: Min assist;From elevated surface         General transfer comment: pt with good hand placement and (A) to anteriorly weight shift onto the RW    Balance Overall balance assessment: Needs assistance Sitting-balance support: No upper extremity supported;Feet supported Sitting balance-Leahy Scale: Good                             ADL                                       Functional mobility during ADLs: Minimal  assistance;Rolling walker General ADL Comments: pt educated on pain management and need to request medication as scheduled. Pt states "i dont like to take medication unless I have to do so. I have been on it for years now." pt educated about controlling pain for the first two weeks to help progress independence and proper healing. pt currently in 7 out 10 pain and requesting only oob to chair to eat lunch. Pt reports "i understand I should have asked sooner." pt educated on don of brace to avoid twisting with MOD I return demo. pt noted to have hemovac on shoulders in supine. Pt educated that when supine the drain should not be under patient (so pt is laying on hemovac). Pt positioned in chair with lunch      Vision                     Perception     Praxis      Cognition   Behavior During Therapy: [facility] for tasks assessed/performed Overall Cognitive Status: Within Functional Limits for tasks assessed                       Extremity/Trunk Assessment               Exercises     Shoulder Instructions  General Comments      Pertinent Vitals/ Pain       Pain Assessment: 0-10 Pain Score: 7  Pain Location: surgical location Pain Descriptors / Indicators: Constant Pain Intervention(s): Premedicated before session;Repositioned  Home Living                                          Prior Functioning/Environment              Frequency Min 2X/week     Progress Toward Goals  OT Goals(current goals can now be found in the care plan section)  Progress towards OT goals: Progressing toward goals  Acute Rehab OT Goals Patient Stated Goal: to get better OT Goal Formulation: With patient Time For Goal Achievement: 10/04/13 Potential to Achieve Goals: Good ADL Goals Pt Will Perform Grooming: with min guard assist;standing Pt Will Perform Upper Body Bathing: with min guard assist;sitting Pt Will Transfer to Toilet: with min guard  assist;bedside commode;ambulating Additional ADL Goal #1: Pt will don brace mod I   Plan Discharge plan remains appropriate    Co-evaluation                 End of Session Equipment Utilized During Treatment: Rolling walker;Back brace   Activity Tolerance Patient limited by pain   Patient Left in chair;with call bell/phone within reach   Nurse Communication Mobility status;Precautions        Time: 1206-1218 OT Time Calculation (min): 12 min  Charges: OT General Charges $OT Visit: 1 Procedure OT Treatments $Self Care/Home Management : 8-22 mins  [name] 09/21/2013, 2:06 PM Pager: [phone]

## 2013-09-22 MED ORDER — CEFAZOLIN SODIUM-DEXTROSE 2-3 GM-% IV SOLR
2.0000 g | Freq: Three times a day (TID) | INTRAVENOUS | Status: AC
  Administered 2013-09-22 – 2013-09-23 (×4): 2 g via INTRAVENOUS
  Filled 2013-09-22 (×6): qty 50

## 2013-09-22 NOTE — Progress Notes (Signed)
Subjective: Patient reports Doing better with pain management continue to mobilize  Objective: Vital signs in last 24 hours: Temp:  [97.8 F (36.6 C)-98.9 F (37.2 C)] 97.8 F (36.6 C) (09/17 0523) Pulse Rate:  [64-87] 64 (09/17 0523) Resp:  [18-20] 18 (09/17 0523) BP: (113-159)/(61-74) 117/67 mmHg (09/17 0523) SpO2:  [95 %-98 %] 97 % (09/17 0523)  Intake/Output from previous day: 09/16 0701 - 09/17 0700 In: 1640 [P.O.:740; I.V.:900] Out: 640 [Urine:400; Drains:240] Intake/Output this shift:    Strength 5 out of 5 wound clean dry and intact  Lab Results: No results found for this basename: WBC, HGB, HCT, PLT,  in the last 72 hours BMET  Recent Labs  09/20/13 1050  NA 137  K 4.5  CL 97  CO2 23  GLUCOSE 231*  BUN 27*  CREATININE 0.99  CALCIUM 8.7    Studies/Results: No results found.  Assessment/Plan: Neurosurgical: Doing well postop day 3 from re\re decompressive laminectomy and fusion continue to mobilize with physical occupational therapy work on rehabilitation placement.  Morbid obesity  LOS: 3 days     Meeka Cartelli P 09/22/2013, 7:40 AM

## 2013-09-22 NOTE — Progress Notes (Signed)
Physical Therapy Treatment Patient Details Name: [name] MRN: [id number] DOB: April 17, 1955 Today's Date: 09/22/2013    History of Present Illness  58 year old  workup revealed progressive degeneration deterioration of L3-4 and due to patient's progression of clinical syndrome. Pt s/p L3-4 interbody fusion extending his fusion down. PMH: DDD, depression, chronic back pain, HTN, MI, Elbow surg, Carpal tunnel release, achilles tendon repair, Rt shoulder rotator cuff repair, back surg x2 BIL joint replacement    PT Comments    Progressing well though slowly.  Will benefit from the short-term rehab stay.   Follow Up Recommendations  SNF     Equipment Recommendations  None recommended by PT    Recommendations for Other Services       Precautions / Restrictions Precautions Precautions: Back Precaution Booklet Issued: Yes (comment) Precaution Comments: pt with good recall of prec, handout present on table Required Braces or Orthoses: Spinal Brace Spinal Brace: Lumbar corset;Applied in sitting position Restrictions Weight Bearing Restrictions: No    Mobility  Bed Mobility Overal bed mobility: Needs Assistance Bed Mobility: Sit to Sidelying         Sit to sidelying: Supervision General bed mobility comments: no assist needed, but cued for better technique  Transfers Overall transfer level: Needs assistance Equipment used: Rolling walker (2 wheeled) Transfers: Sit to/from Stand Sit to Stand: Min guard         General transfer comment: good transfer technique;   Ambulation/Gait Ambulation/Gait assistance: Min guard Ambulation Distance (Feet): 100 Feet Assistive device: Rolling walker (2 wheeled) Gait Pattern/deviations: Step-through pattern;Trunk flexed Gait velocity: slow   General Gait Details: gait steady with improved posture and lighter use of the RW   Stairs            Wheelchair Mobility    Modified Rankin (Stroke Patients Only)        Balance Overall balance assessment: No apparent balance deficits (not formally assessed)   Sitting balance-Leahy Scale: Good       Standing balance-Leahy Scale: Fair                      Cognition Arousal/Alertness: Awake/alert Behavior During Therapy: WFL for tasks assessed/performed Overall Cognitive Status: Within Functional Limits for tasks assessed                      Exercises      General Comments General comments (skin integrity, edema, etc.): Reinforced all back care/prec. log roll, lifting precautions, bracing issues and progression of activity      Pertinent Vitals/Pain Pain Assessment: No/denies pain (started getting some discomfort after walk) Pain Intervention(s): Limited activity within patient's tolerance    Home Living                      Prior Function            PT Goals (current goals can now be found in the care plan section) Acute Rehab PT Goals PT Goal Formulation: With patient Time For Goal Achievement: 09/27/13 Potential to Achieve Goals: Good Progress towards PT goals: Progressing toward goals    Frequency  Min 5X/week    PT Plan Current plan remains appropriate    Co-evaluation             End of Session Equipment Utilized During Treatment: Back brace Activity Tolerance: Patient tolerated treatment well Patient left: in bed;with call bell/phone within reach     Time: 1252-1311 PT Time Calculation (  min): 19 min  Charges:  $Gait Training: 8-22 mins                    G Codes:      Kenidee Cregan, Eliseo Gum 09/22/2013, 3:30 PM 09/22/2013  McGregor Bing, PT [phone]  (pager)

## 2013-09-23 MED ORDER — CYCLOBENZAPRINE HCL 10 MG PO TABS: 10.0000 mg | ORAL_TABLET | Freq: Three times a day (TID) | ORAL | Status: DC | PRN

## 2013-09-23 MED ORDER — OXYCODONE-ACETAMINOPHEN 5-325 MG PO TABS: 1.0000 | ORAL_TABLET | ORAL | Status: DC | PRN

## 2013-09-23 MED ORDER — OXYCODONE HCL 10 MG PO TABS: 10.0000 mg | ORAL_TABLET | Freq: Three times a day (TID) | ORAL | Status: DC | PRN

## 2013-09-23 MED ORDER — ASPIRIN 325 MG PO TABS: 325.0000 mg | ORAL_TABLET | Freq: Every day | ORAL | Status: DC

## 2013-09-23 NOTE — Discharge Summary (Signed)
  Physician Discharge Summary  Patient ID: [name] MRN: [id number] DOB/AGE: 57-10-1955 57 y.o.  Admit date: 09/19/2013 Discharge date: 09/23/2013  Admission Diagnoses: Lumbar stenosis/spondylosis  Discharge Diagnoses: Same Active Problems:   Spinal stenosis of lumbar region   Discharged Condition: Stable  Hospital Course:  Mrs. [name] is a 58 y.o. male electively admitted after undergoing decompressive laminectomy and fusion. Postoperatively, the patient was at his neurologic baseline, with relief of his leg pain. He had slow improvement in his postoperative pain, and continued to work with physical and occupational therapy. He was ultimately deemed a good candidate for skilled nursing facility upon discharge. On postoperative day #3, he was ambulating with therapy, pain was under control, he was tolerating diet, and voiding normally.  Treatments: Surgery - extension of fusion L1-L4  Discharge Exam: Blood pressure 133/75, pulse 89, temperature 98.2 F (36.8 C), temperature source Oral, resp. rate 18, height  (1.854 m), weight 185.022 kg (407 lb 14.4 oz), SpO2 98.00%. Awake, alert, oriented Speech fluent, appropriate CN grossly intact 5/5 BUE/BLE Wound c/d/i  Follow-up: Follow-up in Dr. Lonie Gordon office [facility] [phone]) in 2-3 weeks  Disposition: SNF     Medication List    ASK your doctor about these medications       aspirin 325 MG tablet  Take 325 mg by mouth daily.     atorvastatin 40 MG tablet  Commonly known as:  LIPITOR  Take 40 mg by mouth daily.     etodolac 400 MG tablet  Commonly known as:  LODINE  Take 400 mg by mouth 2 (two) times daily.     lisinopril 20 MG tablet  Commonly known as:  PRINIVIL,ZESTRIL  Take 20 mg by mouth daily.     nebivolol 5 MG tablet  Commonly known as:  BYSTOLIC  Take 5 mg by mouth daily.     Oxycodone HCl 20 MG Tabs  Take 10-20 mg by mouth 3 (three) times daily as needed (severe  pain).     OXYCONTIN 20 mg T12a 12 hr tablet  Generic drug:  OxyCODONE  Take 20 mg by mouth at bedtime.     triamterene-hydrochlorothiazide 75-50 MG per tablet  Commonly known as:  MAXZIDE  Take 1 tablet by mouth daily.     TRILIPIX 135 MG capsule  Generic drug:  Choline Fenofibrate  Take 135 mg by mouth daily.     Vitamin D (Ergocalciferol) 50000 UNITS Caps capsule  Commonly known as:  DRISDOL  Take 50,000 Units by mouth every 7 (seven) days. Takes on wednesday         Signed: Lisbeth Renshaw, Salena Saner 09/23/2013, 4:20 PM

## 2013-09-23 NOTE — Progress Notes (Signed)
UR complete.  Chief Walkup RN, MSN 

## 2013-09-23 NOTE — Clinical Social Work Note (Signed)
Pt has accepted bed offer with [facility]. Pt's bed available throughout weekend if pt medically stable for discharge. Weekend CSW to continue to follow and assist with discharge as needed.  Marcelline Deist, LCSWA [phone]) Licensed Clinical Social Worker Neuroscience [phone]) and Medical ICU (59M)

## 2013-09-23 NOTE — Progress Notes (Signed)
Patient notified RN about IV coming out unknowingly and will like to leave IV off. RN notified MD and order received and carried out.  Will continue to monitor patient. Aisha RN

## 2013-09-23 NOTE — Progress Notes (Signed)
Physical Therapy Treatment Patient Details Name: [name] MRN: [id number] DOB: 21-Feb-1955 Today's Date: 09/23/2013    History of Present Illness  58 year old  workup revealed progressive degeneration deterioration of L3-4 and due to patient's progression of clinical syndrome. Pt s/p L3-4 interbody fusion extending his fusion down. PMH: DDD, depression, chronic back pain, HTN, MI, Elbow surg, Carpal tunnel release, achilles tendon repair, Rt shoulder rotator cuff repair, back surg x2 BIL joint replacement    PT Comments    Progressing well.  Still struggling to be able to relax with gait and decrease heavy use on RW and has a little trouble standing from low surfaces.  Follow Up Recommendations  SNF     Equipment Recommendations  None recommended by PT    Recommendations for Other Services       Precautions / Restrictions Precautions Precautions: Back Precaution Booklet Issued: Yes (comment) Required Braces or Orthoses: Spinal Brace Spinal Brace: Lumbar corset;Applied in sitting position Restrictions Weight Bearing Restrictions: No    Mobility  Bed Mobility Overal bed mobility: Needs Assistance                Transfers Overall transfer level: Needs assistance Equipment used: Rolling walker (2 wheeled) Transfers: Sit to/from Stand Sit to Stand: Supervision         General transfer comment: good transfer technique;   Ambulation/Gait Ambulation/Gait assistance: Supervision Ambulation Distance (Feet): 150 Feet Assistive device: Rolling walker (2 wheeled) Gait Pattern/deviations: Step-through pattern;Trunk flexed Gait velocity: slow   General Gait Details: cues to help improve use of RW and gait quality as well as posture.   Stairs            Wheelchair Mobility    Modified Rankin (Stroke Patients Only)       Balance     Sitting balance-Leahy Scale: Good       Standing balance-Leahy Scale: Fair                       Cognition Arousal/Alertness: Awake/alert Behavior During Therapy: WFL for tasks assessed/performed Overall Cognitive Status: Within Functional Limits for tasks assessed                      Exercises      General Comments General comments (skin integrity, edema, etc.): Reinforced back care/prec, progression of activity      Pertinent Vitals/Pain Pain Assessment: No/denies pain Pain Score: 3  Pain Location: R hip/flank Pain Descriptors / Indicators: Nagging Pain Intervention(s): Limited activity within patient's tolerance    Home Living                      Prior Function            PT Goals (current goals can now be found in the care plan section) Acute Rehab PT Goals PT Goal Formulation: With patient Time For Goal Achievement: 09/27/13 Potential to Achieve Goals: Good Progress towards PT goals: Progressing toward goals    Frequency  Min 5X/week    PT Plan Current plan remains appropriate    Co-evaluation             End of Session Equipment Utilized During Treatment: Back brace Activity Tolerance: Patient tolerated treatment well Patient left: in bed;with call bell/phone within reach     Time: 0950-1007 PT Time Calculation (min): 17 min  Charges:  $Gait Training: 8-22 mins  G Codes:      Roselie Cirigliano, Eliseo Gum 09/23/2013, 10:26 AM 09/23/2013  [place] Bing, PT [phone]  (pager)

## 2013-09-24 NOTE — Progress Notes (Signed)
Pt transported out via [facility].  VSS upon discharge, wife present. Sondra Come, RN 09/24/2013 2:06 PM

## 2013-09-24 NOTE — Progress Notes (Signed)
Pt discharged to [facility] by ambulance.  No futher CSW needs.  Fleet Contras (weekend coverage) [phone]

## 2013-09-24 NOTE — Progress Notes (Signed)
Discharge orders received.  Discharge instructions and follow-up appointments reviewed with the patient and his wife.  Education complete.  Report called to [facility].  Awaiting [facility] for transport.  Will continue to monitor. Sondra Come, RN 09/24/2013 12:28 PM

## 2013-09-26 ENCOUNTER — Other Ambulatory Visit: Payer: Self-pay | Admitting: *Deleted

## 2013-09-26 MED ORDER — OXYCODONE HCL 10 MG PO TABS: ORAL_TABLET | ORAL | Status: DC

## 2013-09-26 NOTE — Telephone Encounter (Signed)
[facility] 

## 2013-09-28 ENCOUNTER — Non-Acute Institutional Stay (SKILLED_NURSING_FACILITY): Payer: PRIVATE HEALTH INSURANCE | Admitting: Internal Medicine

## 2013-09-28 ENCOUNTER — Other Ambulatory Visit: Payer: Self-pay | Admitting: *Deleted

## 2013-09-28 DIAGNOSIS — Z9889 Other specified postprocedural states: Secondary | ICD-10-CM

## 2013-09-28 DIAGNOSIS — E785 Hyperlipidemia, unspecified: Secondary | ICD-10-CM | POA: Diagnosis not present

## 2013-09-28 DIAGNOSIS — E559 Vitamin D deficiency, unspecified: Secondary | ICD-10-CM

## 2013-09-28 DIAGNOSIS — I10 Essential (primary) hypertension: Secondary | ICD-10-CM

## 2013-09-28 MED ORDER — OXYCODONE HCL ER 20 MG PO T12A: EXTENDED_RELEASE_TABLET | ORAL | Status: DC

## 2013-09-28 NOTE — Progress Notes (Signed)
MRN: [id number] Name: [name]  Sex: male Age: 58 y.o. DOB: 11/24/1955  PSC #: Ronni Rumble Facility/Room: 121B Level Of Care: SNF Provider: Merrilee Seashore D Emergency Contacts: Extended Emergency Contact Information Primary Emergency Contact: Lablanc,Carol Address: [address]          [place], [place] [address] [place] Home Phone: [phone] Mobile Phone: [phone] Relation: Spouse  Code Status: FULL  Allergies: Review of patient's allergies indicates no known allergies.  Chief Complaint  Patient presents with  . New Admit To SNF    HPI: Patient is 58 y.o. male who is s/p lumbar laminectomy for decompression and fusion, admitted for OT/PT.  Past Medical History  Diagnosis Date  . Degenerative disk disease   . Chronic back pain     HNP/stenosis  . Depression   . Difficult intubation     dx 05/14/07 [facility]); has anterior larynx, small mouth; glidescope #3 used in 2012 ([facility])  . Myocardial infarct 1999  . Joint pain   . Joint swelling   . History of colon polyps   . History of kidney stones   . Enlarged prostate     slightly  . Vitamin D deficiency     takes Vit D on Weds  . Hypertension     takes Maxzide and Lisinopril daily as well as Bystolic  . Hyperlipidemia     takes Atorvastatin daily    Past Surgical History  Procedure Laterality Date  . Elbow surgery Bilateral   . Tonsillectomy    . Carpal tunnel release Right 2006    had nerve impingement  . Achilles tendon surgery Left     reattached   . Shoulder arthroscopy with rotator cuff repair Right 01/2012  . Joint replacement Bilateral 2012    (R) 03/2000: (L) 10/12  . Back surgery      x 2  . Colonoscopy    . Esophagogastroduodenoscopy    . Laminectomy  09/19/2013    L3  L4    DR CRAM       Medication List       This list is accurate as of: 09/28/13 11:59 PM.  Always use your most recent med list.               aspirin 325 MG tablet  Take 1 tablet (325 mg total) by mouth  daily.     atorvastatin 40 MG tablet  Commonly known as:  LIPITOR  Take 40 mg by mouth daily.     etodolac 400 MG tablet  Commonly known as:  LODINE  Take 400 mg by mouth 2 (two) times daily.     lisinopril 20 MG tablet  Commonly known as:  PRINIVIL,ZESTRIL  Take 20 mg by mouth daily.     nebivolol 5 MG tablet  Commonly known as:  BYSTOLIC  Take 5 mg by mouth daily.     Oxycodone HCl 20 MG Tabs  Take 10-20 mg by mouth 3 (three) times daily as needed (severe pain).     Oxycodone HCl 10 MG Tabs  Take one to two tablets by mouth three times daily as needed for pain     OxyCODONE 20 mg T12a 12 hr tablet  Commonly known as:  OXYCONTIN  Take one tablet by mouth at bedtime. Do not cut/crush/chew     triamterene-hydrochlorothiazide 75-50 MG per tablet  Commonly known as:  MAXZIDE  Take 1 tablet by mouth daily.     TRILIPIX 135 MG capsule  Generic drug:  Choline Fenofibrate  Take 135 mg by mouth daily.     Vitamin D (Ergocalciferol) 50000 UNITS Caps capsule  Commonly known as:  DRISDOL  Take 50,000 Units by mouth every 7 (seven) days. Takes on wednesday        No orders of the defined types were placed in this encounter.    Immunization History  Administered Date(s) Administered  . Influenza,inj,Quad PF,36+ Mos 09/22/2013    History  Substance Use Topics  . Smoking status: Former Smoker -- 1.00 packs/day for 7 years    Types: Cigarettes    Quit date: 12/09/1997  . Smokeless tobacco: Never Used  . Alcohol Use: No    Family history is noncontributory    Review of Systems  DATA OBTAINED: from patient GENERAL: Feels well no fevers, fatigue, appetite changes SKIN: No itching, rash or wounds EYES: No eye pain, redness, discharge EARS: No earache, tinnitus, change in hearing NOSE: No congestion, drainage or bleeding  MOUTH/THROAT: No mouth or tooth pain, No sore throat RESPIRATORY: No cough, wheezing, SOB CARDIAC: No chest pain, palpitations, lower extremity  edema  GI: No abdominal pain, No N/V/D or constipation, No heartburn or reflux  GU: No dysuria, frequency or urgency, or incontinence  MUSCULOSKELETAL: No unrelieved bone/joint pain NEUROLOGIC: No headache, dizziness or focal weakness PSYCHIATRIC: No overt anxiety or sadness. Sleeps well. No behavior issue.   Filed Vitals:   09/28/13 1123  BP: 110/70  Pulse: 78  Temp: 97.7 F (36.5 C)  Resp: 20    Physical Exam  GENERAL APPEARANCE: Alert, conversant. Appropriately groomed. No acute distress.  SKIN: No diaphoresis rash;incisional wound dressed nut no redness or heat to area HEAD: Normocephalic, atraumatic  EYES: Conjunctiva/lids clear. Pupils round, reactive. EOMs intact.  EARS: External exam WNL, canals clear. Hearing grossly normal.  NOSE: No deformity or discharge.  MOUTH/THROAT: Lips w/o lesions  RESPIRATORY: Breathing is even, unlabored. Lung sounds are clear   CARDIOVASCULAR: Heart RRR no murmurs, rubs or gallops. No peripheral edema.  GASTROINTESTINAL: Abdomen is soft, non-tender, not distended w/ normal bowel sounds GENITOURINARY: Bladder non tender, not distended  MUSCULOSKELETAL: No abnormal joints or musculature NEUROLOGIC: Oriented X3. Cranial nerves 2-12 grossly intact. Moves all extremities PSYCHIATRIC: Mood and affect appropriate to situation, no behavioral issues  Patient Active Problem List   Diagnosis Date Noted  . S/P lumbar laminectomy 10/01/2013  . Hypertension   . Hyperlipidemia   . Vitamin D deficiency   . Spinal stenosis of lumbar region 12/13/2012    CBC    Component Value Date/Time   WBC 5.0 09/15/2013 0934   RBC 5.80 09/15/2013 0934   HGB 16.6 09/15/2013 0934   HCT 49.4 09/15/2013 0934   PLT 186 09/15/2013 0934   MCV 85.2 09/15/2013 0934    CMP     Component Value Date/Time   NA 137 09/20/2013 1050   K 4.5 09/20/2013 1050   CL 97 09/20/2013 1050   CO2 23 09/20/2013 1050   GLUCOSE 231* 09/20/2013 1050   BUN 27* 09/20/2013 1050   CREATININE  0.99 09/20/2013 1050   CALCIUM 8.7 09/20/2013 1050   PROT 6.3 09/20/2013 1050   ALBUMIN 3.3* 09/20/2013 1050   AST 85* 09/20/2013 1050   ALT 37 09/20/2013 1050   ALKPHOS 45 09/20/2013 1050   BILITOT 0.5 09/20/2013 1050   GFRNONAA 89* 09/20/2013 1050   GFRAA >90 09/20/2013 1050    Assessment and Plan  S/P lumbar laminectomy 2/2 spinal stenosis with leg pain,  now relieved;for OT/PT; oxycodone for pain  Hypertension Controlled on 3 meds  Hyperlipidemia Continue lipitor 40 mg  Vitamin D deficiency Continue weekly Vit D    Margit Hanks, MD

## 2013-09-28 NOTE — Telephone Encounter (Signed)
[facility]

## 2013-10-01 ENCOUNTER — Encounter: Payer: Self-pay | Admitting: Internal Medicine

## 2013-10-01 DIAGNOSIS — I10 Essential (primary) hypertension: Secondary | ICD-10-CM | POA: Insufficient documentation

## 2013-10-01 DIAGNOSIS — E559 Vitamin D deficiency, unspecified: Secondary | ICD-10-CM | POA: Insufficient documentation

## 2013-10-01 DIAGNOSIS — E785 Hyperlipidemia, unspecified: Secondary | ICD-10-CM | POA: Insufficient documentation

## 2013-10-01 DIAGNOSIS — Z9889 Other specified postprocedural states: Secondary | ICD-10-CM | POA: Insufficient documentation

## 2013-10-01 NOTE — Assessment & Plan Note (Signed)
Controlled on 3 meds

## 2013-10-01 NOTE — Assessment & Plan Note (Signed)
2/2 spinal stenosis with leg pain, now relieved;for OT/PT; oxycodone for pain

## 2013-10-01 NOTE — Assessment & Plan Note (Signed)
Continue weekly Vit D

## 2013-10-01 NOTE — Assessment & Plan Note (Signed)
Continue lipitor 40mg  

## 2013-10-05 ENCOUNTER — Encounter: Payer: Self-pay | Admitting: Internal Medicine

## 2013-10-05 ENCOUNTER — Non-Acute Institutional Stay (SKILLED_NURSING_FACILITY): Payer: PRIVATE HEALTH INSURANCE | Admitting: Internal Medicine

## 2013-10-05 DIAGNOSIS — I10 Essential (primary) hypertension: Secondary | ICD-10-CM | POA: Diagnosis not present

## 2013-10-05 DIAGNOSIS — Z9889 Other specified postprocedural states: Secondary | ICD-10-CM | POA: Diagnosis not present

## 2013-10-05 DIAGNOSIS — E785 Hyperlipidemia, unspecified: Secondary | ICD-10-CM

## 2013-10-05 DIAGNOSIS — M48062 Spinal stenosis, lumbar region with neurogenic claudication: Secondary | ICD-10-CM

## 2013-10-05 DIAGNOSIS — E559 Vitamin D deficiency, unspecified: Secondary | ICD-10-CM

## 2013-10-05 NOTE — Progress Notes (Signed)
Patient ID: Leonard KingfisherRandall Gordon, male   DOB: 1955-01-12, 58 y.o.   MRN: [id number]  [facility] D[facility] Dulworth L. Renato Gailseed, D.O., C.M.D.  PCP: Eunice Blase'BUCH,GRETA, PA-C   No Known Allergies  Chief Complaint  Patient presents with  . Discharge Note    discharge home with outpatient PT;  no home health or DME needs    HPI:  58 yo male with h/o DDD, lumbar spinal stenosis with left-sided radiculopathy and weakness, OA bilateral knees with prior TKAs, obesity, htn, hyperlipidemia seen for discharge from SNF.  He was here for short term rehab s/p hospitalization for lumbar spinal stenosis.  He underwent laminectomy, decompression and fusion by Dr. Wynetta Emeryram.  He had been having difficulty with weakness of his left leg that was limiting his ability to climb stairs (has in home) so he came here to work on that.  He is now able to do this with a cane and feels comfortable going home.  He already has all of the necessary DME from his prior back surgeries and knee replacements.  He tells me his PCP provides all of his medications at all times, and he has plenty of medication left from pre-surgery so he does not want any prescriptions from me to avoid confusion.  His pain has improved and he tells me he is down to at least 1/3 of his amt of pain medication from before the procedure.  The radicular pain in his left leg is gone.  He has only a mild residual superficial numbness of his left medial thigh and some pain in his right lower hip if he sits down low like on the toilet.  Dr. Wynetta Emeryram advised him that this may be temporary.  Overall, he is much improved.  Review of Systems:  Review of Systems  Constitutional: Negative for fever.  HENT: Negative for hearing loss.   Eyes: Negative for blurred vision.       Glasses  Respiratory: Negative for shortness of breath.   Cardiovascular: Positive for leg swelling. Negative for chest pain.  Gastrointestinal: Negative for constipation.  Genitourinary: Negative for  dysuria.  Musculoskeletal: Positive for back pain. Negative for falls.  Skin:       Mild soreness at surgical site  Neurological: Positive for sensory change. Negative for dizziness, focal weakness and headaches.  Endo/Heme/Allergies: Does not bruise/bleed easily.  Psychiatric/Behavioral: Negative for memory loss.    Past Medical History  Diagnosis Date  . Degenerative disk disease   . Chronic back pain     HNP/stenosis  . Depression   . Difficult intubation     dx 05/14/07 [facility]); has anterior larynx, small mouth; glidescope #3 used in 2012 ([facility])  . Myocardial infarct 1999  . Joint pain   . Joint swelling   . History of colon polyps   . History of kidney stones   . Enlarged prostate     slightly  . Vitamin D deficiency     takes Vit D on Weds  . Hypertension     takes Maxzide and Lisinopril daily as well as Bystolic  . Hyperlipidemia     takes Atorvastatin daily    Past Surgical History  Procedure Laterality Date  . Elbow surgery Bilateral   . Tonsillectomy    . Carpal tunnel release Right 2006    had nerve impingement  . Achilles tendon surgery Left     reattached   . Shoulder arthroscopy with rotator cuff repair Right 01/2012  . Joint replacement Bilateral 2012    (  R) 03/2000: (L) 10/12  . Back surgery      x 2  . Colonoscopy    . Esophagogastroduodenoscopy    . Laminectomy  09/19/2013    L3  L4    DR CRAM     Social History:   reports that he quit smoking about 15 years ago. His smoking use included Cigarettes. He has a 7 pack-year smoking history. He has never used smokeless tobacco. He reports that he does not drink alcohol or use illicit drugs.  No family history on file.  Medications: Patient's Medications  New Prescriptions   No medications on file  Previous Medications   ASPIRIN 325 MG TABLET    Take 1 tablet (325 mg total) by mouth daily.   ATORVASTATIN (LIPITOR) 40 MG TABLET    Take 40 mg by mouth daily.   CHOLINE FENOFIBRATE (TRILIPIX) 135 MG  CAPSULE    Take 135 mg by mouth daily.   ETODOLAC (LODINE) 400 MG TABLET    Take 400 mg by mouth 2 (two) times daily.   LISINOPRIL (PRINIVIL,ZESTRIL) 20 MG TABLET    Take 20 mg by mouth daily.   NEBIVOLOL (BYSTOLIC) 5 MG TABLET    Take 5 mg by mouth daily.   OXYCODONE (OXYCONTIN) 20 MG T12A 12 HR TABLET    Take one tablet by mouth at bedtime. Do not cut/crush/chew   OXYCODONE HCL 10 MG TABS    Take one to two tablets by mouth three times daily as needed for pain   OXYCODONE HCL 20 MG TABS    Take 10-20 mg by mouth 3 (three) times daily as needed (severe pain).    TRIAMTERENE-HYDROCHLOROTHIAZIDE (MAXZIDE) 75-50 MG PER TABLET    Take 1 tablet by mouth daily.   VITAMIN D, ERGOCALCIFEROL, (DRISDOL) 50000 UNITS CAPS CAPSULE    Take 50,000 Units by mouth every 7 (seven) days. Takes on wednesday  Modified Medications   No medications on file  Discontinued Medications   No medications on file    Physical Exam: Physical Exam  Constitutional: He is oriented to person, place, and time. He appears well-developed and well-nourished. No distress.  Obese white male sitting up in bed  Cardiovascular: Normal rate, regular rhythm, normal heart sounds and intact distal pulses.   Pulmonary/Chest: Effort normal and breath sounds normal. No respiratory distress.  Abdominal: Soft. Bowel sounds are normal. He exhibits no distension and no mass. There is no tenderness.  Musculoskeletal: Normal range of motion.  Neurological: He is alert and oriented to person, place, and time.  Skin: Skin is warm and dry.  No erythema, warmth, drainage at lumbar spine surgical site, steristrips mostly still intact; chronic venous stasis changes of bilateral legs  Psychiatric: He has a normal mood and affect.    Labs reviewed: Basic Metabolic Panel:  Recent Labs  40/98/11 1340 09/15/13 0934 09/20/13 1050  NA 139 137 137  K 4.7 4.3 4.5  CL 103 101 97  CO2 24 23 23   GLUCOSE 114* 105* 231*  BUN 30* 19 27*  CREATININE  1.21 0.87 0.99  CALCIUM 9.9 9.2 8.7   Liver Function Tests:  Recent Labs  09/20/13 1050  AST 85*  ALT 37  ALKPHOS 45  BILITOT 0.5  PROT 6.3  ALBUMIN 3.3*   No results found for this basename: LIPASE, AMYLASE,  in the last 8760 hours No results found for this basename: AMMONIA,  in the last 8760 hours CBC:  Recent Labs  12/09/12 1340 09/15/13 0934  WBC 6.1 5.0  HGB 17.4* 16.6  HCT 50.5 49.4  MCV 89.4 85.2  PLT 199 186    Assessment/Plan:   1. Spinal stenosis, lumbar region, with neurogenic claudication -s/p lumbar laminectomy, decompression, fusion by Dr. Wynetta Emery with much improvement as in hpi -pt only using one oxycodone 10mg  for breakthrough daily and his scheduled long acting 20mg  oxycontin, plus regular lodine 400mg  po bid (monitor renal function)  2. S/P lumbar laminectomy -incision doing well  -has done great with therapy here and will complete the remainder as outpatient  3. Essential hypertension -bp at goal with maxzide, bystolic and lisinopril  4. Hyperlipidemia -lipids controlled with lipitor and trilipix, must watch diet and lose weight to help his joints  5. Vitamin D deficiency -cont vitamin D weekly at least for a few months, could then change to a daily supplement if desired to help maintain bone strength also   Patient is being discharged with home health services:  Will go home with OUTPATIENT PT  Patient is being discharged with the following durable medical equipment:  Does not need any  Patient has been advised to f/u with their PCP in 1-2 weeks to bring them up to date on their rehab stay.  Pt does not request any meds from me saying he gets them all through his PCP.

## 2015-06-12 DIAGNOSIS — I350 Nonrheumatic aortic (valve) stenosis: Secondary | ICD-10-CM | POA: Diagnosis not present

## 2015-06-12 DIAGNOSIS — N183 Chronic kidney disease, stage 3 (moderate): Secondary | ICD-10-CM | POA: Diagnosis not present

## 2015-06-12 DIAGNOSIS — I6523 Occlusion and stenosis of bilateral carotid arteries: Secondary | ICD-10-CM | POA: Diagnosis not present

## 2015-06-28 DIAGNOSIS — I10 Essential (primary) hypertension: Secondary | ICD-10-CM | POA: Diagnosis not present

## 2015-06-28 DIAGNOSIS — I2510 Atherosclerotic heart disease of native coronary artery without angina pectoris: Secondary | ICD-10-CM | POA: Diagnosis not present

## 2015-06-28 DIAGNOSIS — I517 Cardiomegaly: Secondary | ICD-10-CM | POA: Diagnosis not present

## 2015-06-28 DIAGNOSIS — I350 Nonrheumatic aortic (valve) stenosis: Secondary | ICD-10-CM | POA: Diagnosis not present

## 2015-06-28 DIAGNOSIS — R0602 Shortness of breath: Secondary | ICD-10-CM | POA: Diagnosis not present

## 2015-07-18 IMAGING — RF DG C-ARM 61-120 MIN
1 series · 2 of 2 positions shown · non-contrast
Comparison: None.

CLINICAL DATA: Lumbar fusion.

EXAM:
DG C-ARM 61-120 MIN

[Series 1: run · 2 of 2 slices shown]
[im 1/2]
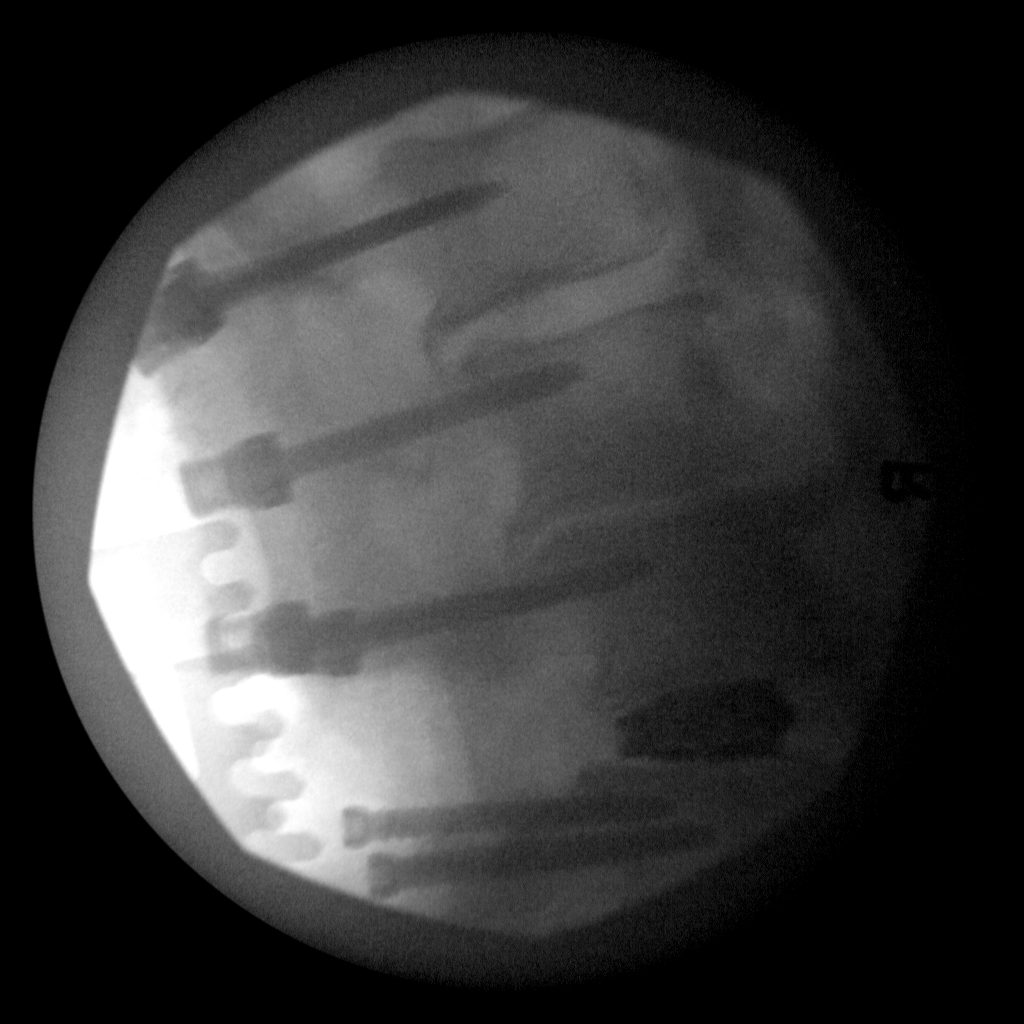
[im 2/2]
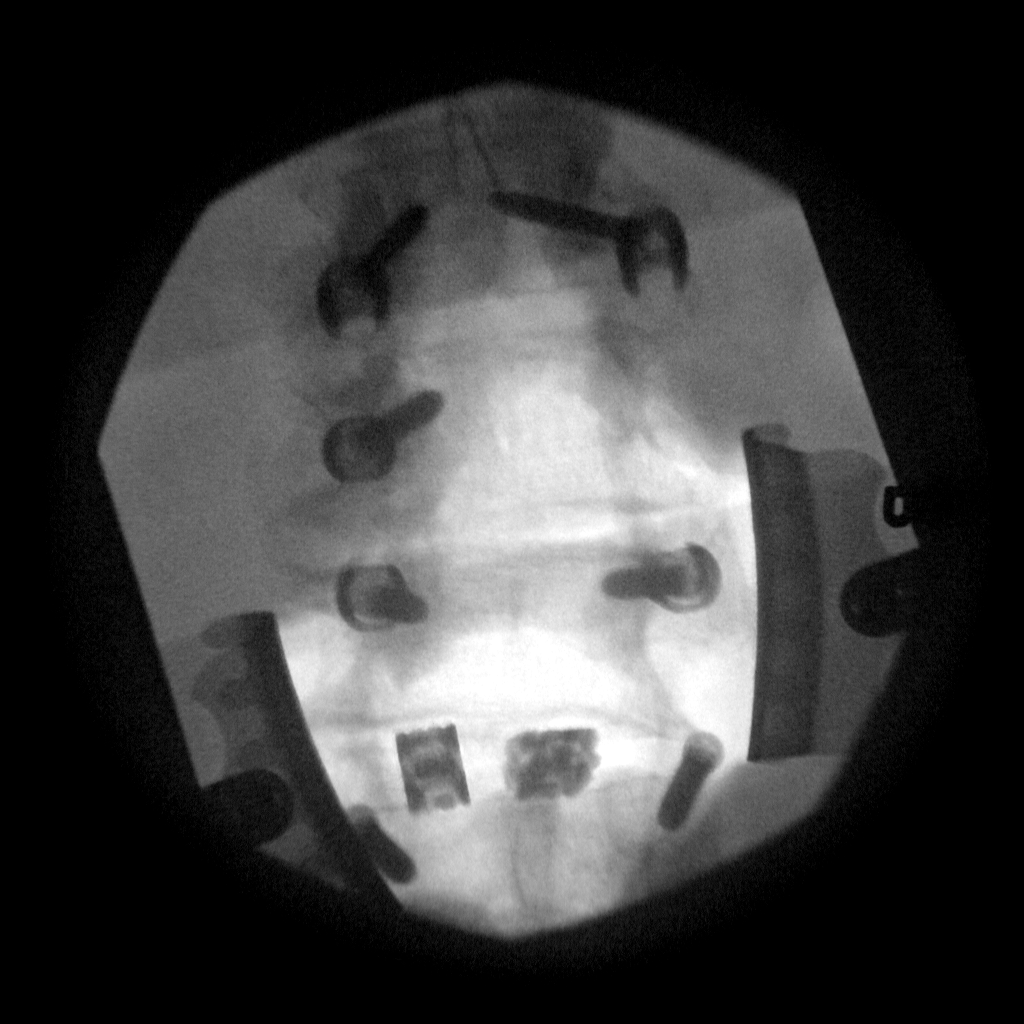

[2 of 2 positions shown; findings below may reference images not displayed]

FINDINGS: Fluoroscopic spot films demonstrate multilevel pedicle screws and
interbody fusion device. No complicating features.
IMPRESSION: Lumbar fusion hardware in good position without complicating
features.

## 2015-07-18 IMAGING — CR DG SHOULDER 1V*L*
3 series · 3 of 3 positions shown · non-contrast
Comparison: Concurrently obtained chest x-ray

CLINICAL DATA: Left shoulder pain

EXAM:
LEFT SHOULDER - 1 VIEW

[AP (1 of 3)]
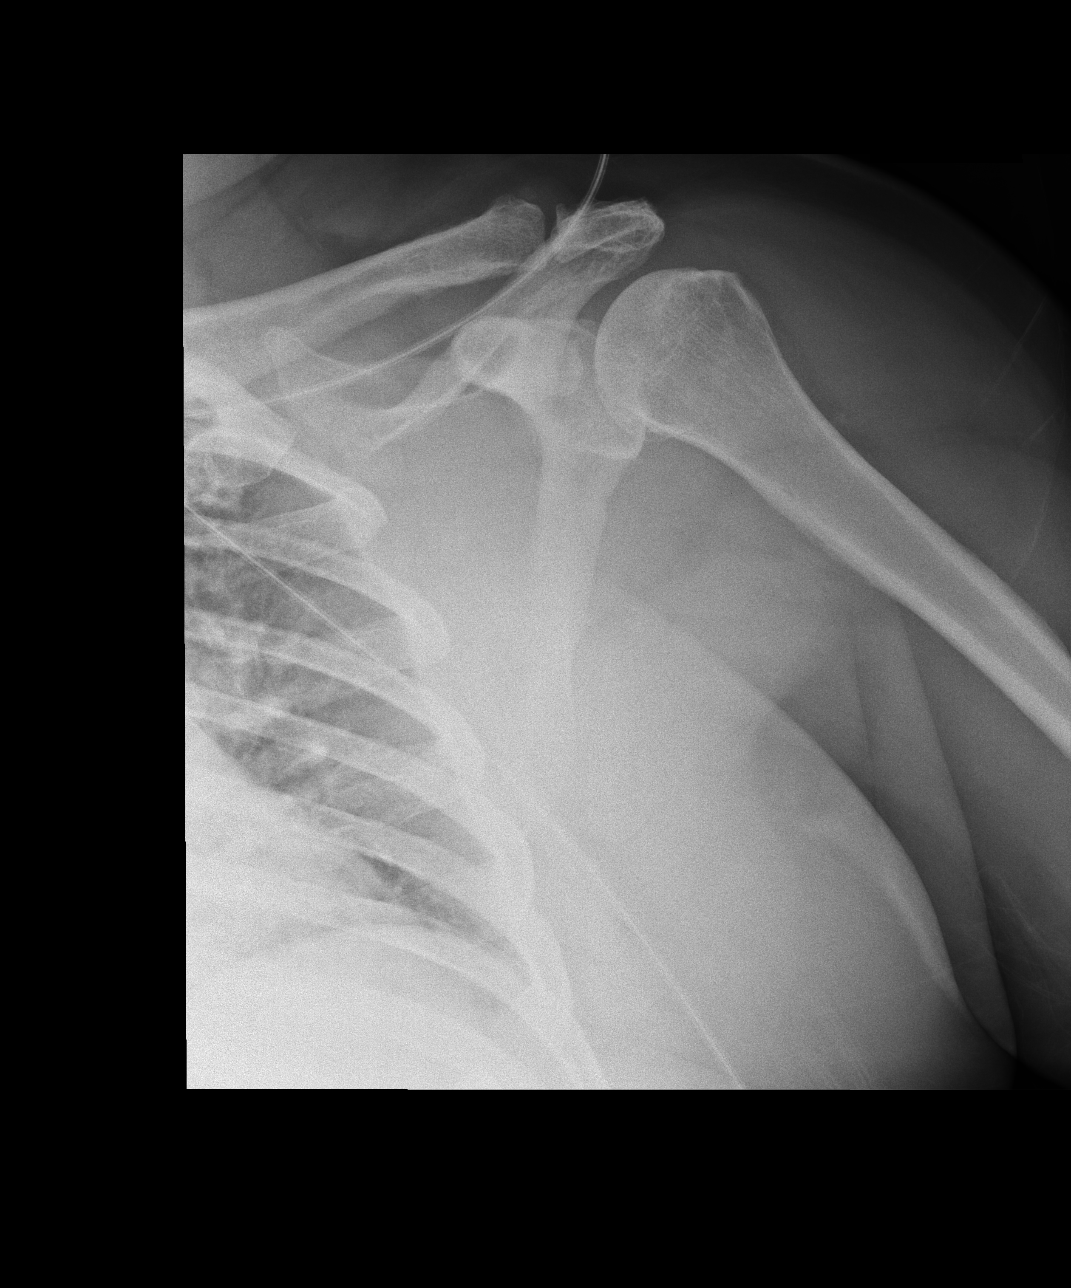

[AP (2 of 3)]
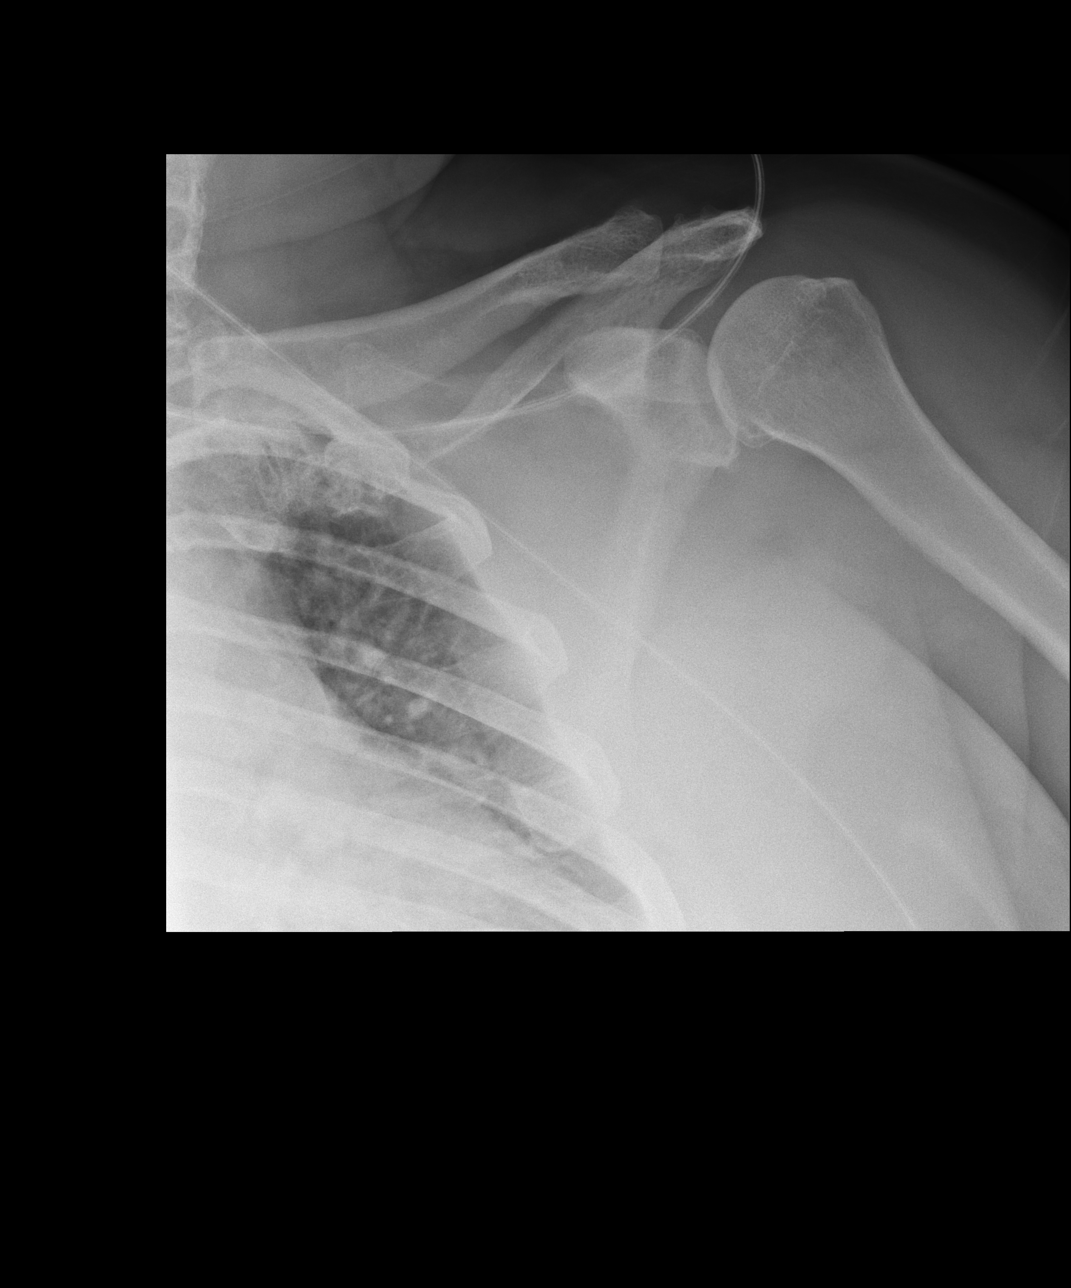

[AP (3 of 3)]
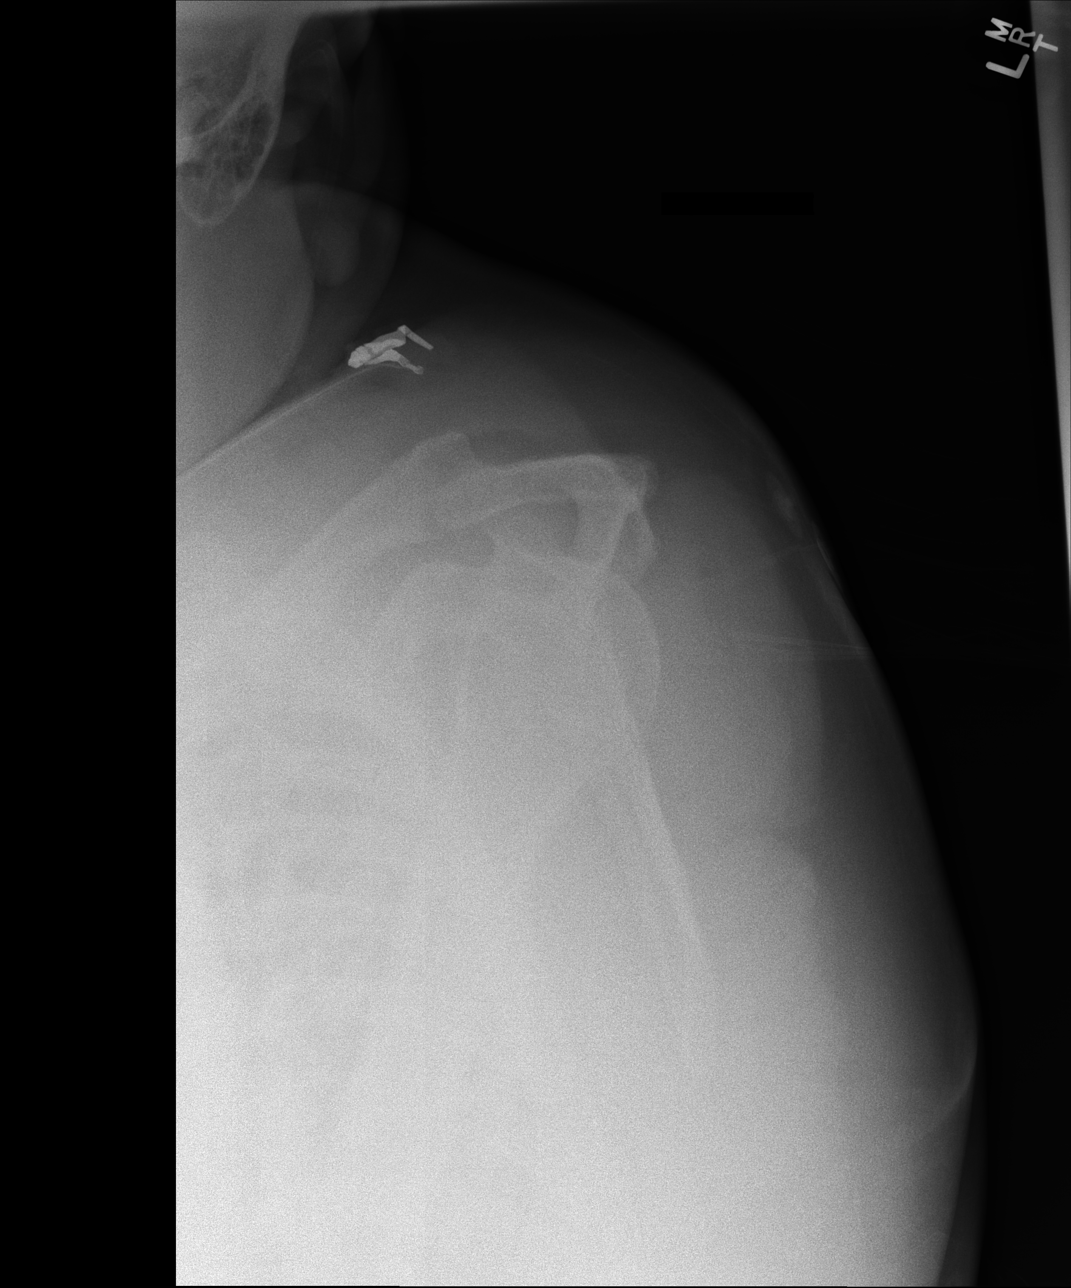

[3 of 3 positions shown; findings below may reference images not displayed]

FINDINGS: No acute fracture or malalignment. The glenohumeral joint remains
located. Mild degenerative change at the acromioclavicular and
glenohumeral joints. Bony mineralization is within normal limits. No
lytic or blastic osseous lesion. Visualized thorax is unremarkable.
Is
IMPRESSION: 1. No acute osseous abnormality.
2. Mild degenerative change at the acromioclavicular and
glenohumeral joints.

## 2015-07-25 DIAGNOSIS — E559 Vitamin D deficiency, unspecified: Secondary | ICD-10-CM | POA: Diagnosis not present

## 2015-07-25 DIAGNOSIS — E782 Mixed hyperlipidemia: Secondary | ICD-10-CM | POA: Diagnosis not present

## 2015-07-25 DIAGNOSIS — I10 Essential (primary) hypertension: Secondary | ICD-10-CM | POA: Diagnosis not present

## 2015-07-25 DIAGNOSIS — R739 Hyperglycemia, unspecified: Secondary | ICD-10-CM | POA: Diagnosis not present

## 2015-07-25 DIAGNOSIS — Z125 Encounter for screening for malignant neoplasm of prostate: Secondary | ICD-10-CM | POA: Diagnosis not present

## 2015-07-25 DIAGNOSIS — Z79899 Other long term (current) drug therapy: Secondary | ICD-10-CM | POA: Diagnosis not present

## 2015-10-25 DIAGNOSIS — M549 Dorsalgia, unspecified: Secondary | ICD-10-CM | POA: Diagnosis not present

## 2015-10-25 DIAGNOSIS — I10 Essential (primary) hypertension: Secondary | ICD-10-CM | POA: Diagnosis not present

## 2015-10-25 DIAGNOSIS — Z79899 Other long term (current) drug therapy: Secondary | ICD-10-CM | POA: Diagnosis not present

## 2015-10-25 DIAGNOSIS — E559 Vitamin D deficiency, unspecified: Secondary | ICD-10-CM | POA: Diagnosis not present

## 2015-10-25 DIAGNOSIS — E782 Mixed hyperlipidemia: Secondary | ICD-10-CM | POA: Diagnosis not present

## 2015-10-25 DIAGNOSIS — R739 Hyperglycemia, unspecified: Secondary | ICD-10-CM | POA: Diagnosis not present

## 2015-10-25 DIAGNOSIS — Z1389 Encounter for screening for other disorder: Secondary | ICD-10-CM | POA: Diagnosis not present

## 2016-01-14 DIAGNOSIS — I2510 Atherosclerotic heart disease of native coronary artery without angina pectoris: Secondary | ICD-10-CM | POA: Diagnosis not present

## 2016-01-14 DIAGNOSIS — I10 Essential (primary) hypertension: Secondary | ICD-10-CM | POA: Diagnosis not present

## 2016-01-14 DIAGNOSIS — R0602 Shortness of breath: Secondary | ICD-10-CM | POA: Diagnosis not present

## 2016-01-14 DIAGNOSIS — I350 Nonrheumatic aortic (valve) stenosis: Secondary | ICD-10-CM | POA: Diagnosis not present

## 2016-01-28 DIAGNOSIS — I10 Essential (primary) hypertension: Secondary | ICD-10-CM | POA: Diagnosis not present

## 2016-01-28 DIAGNOSIS — E559 Vitamin D deficiency, unspecified: Secondary | ICD-10-CM | POA: Diagnosis not present

## 2016-01-28 DIAGNOSIS — R739 Hyperglycemia, unspecified: Secondary | ICD-10-CM | POA: Diagnosis not present

## 2016-01-28 DIAGNOSIS — Z0000 Encounter for general adult medical examination without abnormal findings: Secondary | ICD-10-CM | POA: Diagnosis not present

## 2016-02-03 DIAGNOSIS — J209 Acute bronchitis, unspecified: Secondary | ICD-10-CM | POA: Diagnosis not present

## 2016-04-28 DIAGNOSIS — Z9884 Bariatric surgery status: Secondary | ICD-10-CM | POA: Diagnosis not present

## 2016-04-28 DIAGNOSIS — E559 Vitamin D deficiency, unspecified: Secondary | ICD-10-CM | POA: Diagnosis not present

## 2016-04-28 DIAGNOSIS — K219 Gastro-esophageal reflux disease without esophagitis: Secondary | ICD-10-CM | POA: Diagnosis not present

## 2016-06-27 DIAGNOSIS — I6523 Occlusion and stenosis of bilateral carotid arteries: Secondary | ICD-10-CM | POA: Diagnosis not present

## 2016-06-27 DIAGNOSIS — R0602 Shortness of breath: Secondary | ICD-10-CM | POA: Diagnosis not present

## 2016-06-27 DIAGNOSIS — I2510 Atherosclerotic heart disease of native coronary artery without angina pectoris: Secondary | ICD-10-CM | POA: Diagnosis not present

## 2016-06-27 DIAGNOSIS — I350 Nonrheumatic aortic (valve) stenosis: Secondary | ICD-10-CM | POA: Diagnosis not present

## 2016-06-27 DIAGNOSIS — I10 Essential (primary) hypertension: Secondary | ICD-10-CM | POA: Diagnosis not present

## 2016-07-01 DIAGNOSIS — R0602 Shortness of breath: Secondary | ICD-10-CM | POA: Diagnosis not present

## 2016-07-04 DIAGNOSIS — I350 Nonrheumatic aortic (valve) stenosis: Secondary | ICD-10-CM | POA: Diagnosis not present

## 2016-07-04 DIAGNOSIS — I10 Essential (primary) hypertension: Secondary | ICD-10-CM | POA: Diagnosis not present

## 2016-07-04 DIAGNOSIS — R0602 Shortness of breath: Secondary | ICD-10-CM | POA: Diagnosis not present

## 2016-07-08 DIAGNOSIS — R0602 Shortness of breath: Secondary | ICD-10-CM | POA: Diagnosis not present

## 2016-07-14 DIAGNOSIS — R0602 Shortness of breath: Secondary | ICD-10-CM | POA: Diagnosis not present

## 2016-07-14 DIAGNOSIS — G894 Chronic pain syndrome: Secondary | ICD-10-CM | POA: Diagnosis not present

## 2016-07-14 DIAGNOSIS — I378 Other nonrheumatic pulmonary valve disorders: Secondary | ICD-10-CM | POA: Diagnosis not present

## 2016-07-17 DIAGNOSIS — I350 Nonrheumatic aortic (valve) stenosis: Secondary | ICD-10-CM | POA: Diagnosis not present

## 2016-07-17 DIAGNOSIS — I10 Essential (primary) hypertension: Secondary | ICD-10-CM | POA: Diagnosis not present

## 2016-07-17 DIAGNOSIS — R0602 Shortness of breath: Secondary | ICD-10-CM | POA: Diagnosis not present

## 2016-07-28 DIAGNOSIS — E782 Mixed hyperlipidemia: Secondary | ICD-10-CM | POA: Diagnosis not present

## 2016-07-28 DIAGNOSIS — E559 Vitamin D deficiency, unspecified: Secondary | ICD-10-CM | POA: Diagnosis not present

## 2016-07-28 DIAGNOSIS — I10 Essential (primary) hypertension: Secondary | ICD-10-CM | POA: Diagnosis not present

## 2016-07-28 DIAGNOSIS — R739 Hyperglycemia, unspecified: Secondary | ICD-10-CM | POA: Diagnosis not present

## 2016-07-28 DIAGNOSIS — Z79899 Other long term (current) drug therapy: Secondary | ICD-10-CM | POA: Diagnosis not present

## 2016-07-30 DIAGNOSIS — Z9884 Bariatric surgery status: Secondary | ICD-10-CM | POA: Diagnosis not present

## 2016-07-30 DIAGNOSIS — I10 Essential (primary) hypertension: Secondary | ICD-10-CM | POA: Diagnosis not present

## 2016-07-30 DIAGNOSIS — E785 Hyperlipidemia, unspecified: Secondary | ICD-10-CM | POA: Diagnosis not present

## 2016-08-21 DIAGNOSIS — I2510 Atherosclerotic heart disease of native coronary artery without angina pectoris: Secondary | ICD-10-CM | POA: Diagnosis not present

## 2016-08-21 DIAGNOSIS — G4710 Hypersomnia, unspecified: Secondary | ICD-10-CM | POA: Diagnosis not present

## 2016-08-21 DIAGNOSIS — I350 Nonrheumatic aortic (valve) stenosis: Secondary | ICD-10-CM | POA: Diagnosis not present

## 2016-08-21 DIAGNOSIS — M159 Polyosteoarthritis, unspecified: Secondary | ICD-10-CM | POA: Diagnosis not present

## 2016-09-12 DIAGNOSIS — G4720 Circadian rhythm sleep disorder, unspecified type: Secondary | ICD-10-CM | POA: Diagnosis not present

## 2016-09-12 DIAGNOSIS — G4733 Obstructive sleep apnea (adult) (pediatric): Secondary | ICD-10-CM | POA: Diagnosis not present

## 2016-09-12 DIAGNOSIS — R0902 Hypoxemia: Secondary | ICD-10-CM | POA: Diagnosis not present

## 2016-09-25 DIAGNOSIS — I350 Nonrheumatic aortic (valve) stenosis: Secondary | ICD-10-CM | POA: Diagnosis not present

## 2016-09-25 DIAGNOSIS — R0609 Other forms of dyspnea: Secondary | ICD-10-CM | POA: Diagnosis not present

## 2016-09-26 DIAGNOSIS — J209 Acute bronchitis, unspecified: Secondary | ICD-10-CM | POA: Diagnosis not present

## 2016-09-26 DIAGNOSIS — I10 Essential (primary) hypertension: Secondary | ICD-10-CM | POA: Diagnosis not present

## 2016-09-26 DIAGNOSIS — I350 Nonrheumatic aortic (valve) stenosis: Secondary | ICD-10-CM | POA: Diagnosis not present

## 2016-09-26 DIAGNOSIS — G894 Chronic pain syndrome: Secondary | ICD-10-CM | POA: Diagnosis not present

## 2016-09-26 DIAGNOSIS — G4733 Obstructive sleep apnea (adult) (pediatric): Secondary | ICD-10-CM | POA: Diagnosis not present

## 2016-10-03 DIAGNOSIS — G4733 Obstructive sleep apnea (adult) (pediatric): Secondary | ICD-10-CM | POA: Diagnosis not present

## 2016-10-14 DIAGNOSIS — I350 Nonrheumatic aortic (valve) stenosis: Secondary | ICD-10-CM | POA: Diagnosis not present

## 2016-10-14 DIAGNOSIS — R0600 Dyspnea, unspecified: Secondary | ICD-10-CM | POA: Diagnosis not present

## 2016-10-14 DIAGNOSIS — R5383 Other fatigue: Secondary | ICD-10-CM | POA: Diagnosis not present

## 2016-10-14 DIAGNOSIS — M1990 Unspecified osteoarthritis, unspecified site: Secondary | ICD-10-CM | POA: Diagnosis not present

## 2016-10-23 DIAGNOSIS — I517 Cardiomegaly: Secondary | ICD-10-CM | POA: Diagnosis not present

## 2016-10-23 DIAGNOSIS — I350 Nonrheumatic aortic (valve) stenosis: Secondary | ICD-10-CM | POA: Diagnosis not present

## 2016-10-23 DIAGNOSIS — I2510 Atherosclerotic heart disease of native coronary artery without angina pectoris: Secondary | ICD-10-CM | POA: Diagnosis not present

## 2016-10-28 DIAGNOSIS — Z1389 Encounter for screening for other disorder: Secondary | ICD-10-CM | POA: Diagnosis not present

## 2016-10-28 DIAGNOSIS — Z79899 Other long term (current) drug therapy: Secondary | ICD-10-CM | POA: Diagnosis not present

## 2016-10-28 DIAGNOSIS — I10 Essential (primary) hypertension: Secondary | ICD-10-CM | POA: Diagnosis not present

## 2016-10-28 DIAGNOSIS — E559 Vitamin D deficiency, unspecified: Secondary | ICD-10-CM | POA: Diagnosis not present

## 2016-10-28 DIAGNOSIS — R739 Hyperglycemia, unspecified: Secondary | ICD-10-CM | POA: Diagnosis not present

## 2016-10-28 DIAGNOSIS — E782 Mixed hyperlipidemia: Secondary | ICD-10-CM | POA: Diagnosis not present

## 2016-11-02 DIAGNOSIS — G4733 Obstructive sleep apnea (adult) (pediatric): Secondary | ICD-10-CM | POA: Diagnosis not present

## 2016-11-03 DIAGNOSIS — G4733 Obstructive sleep apnea (adult) (pediatric): Secondary | ICD-10-CM | POA: Diagnosis not present

## 2016-11-03 DIAGNOSIS — I350 Nonrheumatic aortic (valve) stenosis: Secondary | ICD-10-CM | POA: Diagnosis not present

## 2016-11-03 DIAGNOSIS — I10 Essential (primary) hypertension: Secondary | ICD-10-CM | POA: Diagnosis not present

## 2016-11-03 DIAGNOSIS — G894 Chronic pain syndrome: Secondary | ICD-10-CM | POA: Diagnosis not present

## 2016-11-06 DIAGNOSIS — I350 Nonrheumatic aortic (valve) stenosis: Secondary | ICD-10-CM | POA: Diagnosis not present

## 2016-11-12 DIAGNOSIS — I10 Essential (primary) hypertension: Secondary | ICD-10-CM | POA: Diagnosis not present

## 2016-11-12 DIAGNOSIS — I252 Old myocardial infarction: Secondary | ICD-10-CM | POA: Diagnosis not present

## 2016-11-12 DIAGNOSIS — I2510 Atherosclerotic heart disease of native coronary artery without angina pectoris: Secondary | ICD-10-CM | POA: Diagnosis not present

## 2016-11-12 DIAGNOSIS — Z7902 Long term (current) use of antithrombotics/antiplatelets: Secondary | ICD-10-CM | POA: Diagnosis not present

## 2016-11-12 DIAGNOSIS — I350 Nonrheumatic aortic (valve) stenosis: Secondary | ICD-10-CM | POA: Diagnosis not present

## 2016-11-12 DIAGNOSIS — Z952 Presence of prosthetic heart valve: Secondary | ICD-10-CM | POA: Diagnosis not present

## 2016-11-12 DIAGNOSIS — G8929 Other chronic pain: Secondary | ICD-10-CM | POA: Diagnosis not present

## 2016-11-12 DIAGNOSIS — K219 Gastro-esophageal reflux disease without esophagitis: Secondary | ICD-10-CM | POA: Diagnosis not present

## 2016-11-12 DIAGNOSIS — Z9884 Bariatric surgery status: Secondary | ICD-10-CM | POA: Diagnosis not present

## 2016-11-12 DIAGNOSIS — E669 Obesity, unspecified: Secondary | ICD-10-CM | POA: Diagnosis not present

## 2016-11-12 DIAGNOSIS — Z7982 Long term (current) use of aspirin: Secondary | ICD-10-CM | POA: Diagnosis not present

## 2016-11-12 DIAGNOSIS — Z006 Encounter for examination for normal comparison and control in clinical research program: Secondary | ICD-10-CM | POA: Diagnosis not present

## 2016-11-12 DIAGNOSIS — Z7951 Long term (current) use of inhaled steroids: Secondary | ICD-10-CM | POA: Diagnosis not present

## 2016-11-12 DIAGNOSIS — J309 Allergic rhinitis, unspecified: Secondary | ICD-10-CM | POA: Diagnosis not present

## 2016-11-12 DIAGNOSIS — E785 Hyperlipidemia, unspecified: Secondary | ICD-10-CM | POA: Diagnosis not present

## 2016-11-12 DIAGNOSIS — Z6841 Body Mass Index (BMI) 40.0 and over, adult: Secondary | ICD-10-CM | POA: Diagnosis not present

## 2016-11-12 DIAGNOSIS — G4700 Insomnia, unspecified: Secondary | ICD-10-CM | POA: Diagnosis not present

## 2016-11-12 DIAGNOSIS — Z87891 Personal history of nicotine dependence: Secondary | ICD-10-CM | POA: Diagnosis not present

## 2016-11-12 DIAGNOSIS — I4589 Other specified conduction disorders: Secondary | ICD-10-CM | POA: Diagnosis not present

## 2016-11-12 DIAGNOSIS — R9431 Abnormal electrocardiogram [ECG] [EKG]: Secondary | ICD-10-CM | POA: Diagnosis not present

## 2016-11-12 DIAGNOSIS — I440 Atrioventricular block, first degree: Secondary | ICD-10-CM | POA: Diagnosis not present

## 2016-11-13 DIAGNOSIS — I10 Essential (primary) hypertension: Secondary | ICD-10-CM | POA: Diagnosis not present

## 2016-11-13 DIAGNOSIS — Z952 Presence of prosthetic heart valve: Secondary | ICD-10-CM | POA: Diagnosis not present

## 2016-11-13 DIAGNOSIS — I4589 Other specified conduction disorders: Secondary | ICD-10-CM | POA: Diagnosis not present

## 2016-11-13 DIAGNOSIS — I350 Nonrheumatic aortic (valve) stenosis: Secondary | ICD-10-CM | POA: Diagnosis not present

## 2016-11-13 DIAGNOSIS — R9431 Abnormal electrocardiogram [ECG] [EKG]: Secondary | ICD-10-CM | POA: Diagnosis not present

## 2016-11-13 DIAGNOSIS — I440 Atrioventricular block, first degree: Secondary | ICD-10-CM | POA: Diagnosis not present

## 2016-11-13 DIAGNOSIS — E785 Hyperlipidemia, unspecified: Secondary | ICD-10-CM | POA: Diagnosis not present

## 2016-12-03 DIAGNOSIS — G4733 Obstructive sleep apnea (adult) (pediatric): Secondary | ICD-10-CM | POA: Diagnosis not present

## 2016-12-04 DIAGNOSIS — G894 Chronic pain syndrome: Secondary | ICD-10-CM | POA: Diagnosis not present

## 2016-12-04 DIAGNOSIS — I10 Essential (primary) hypertension: Secondary | ICD-10-CM | POA: Diagnosis not present

## 2016-12-04 DIAGNOSIS — I350 Nonrheumatic aortic (valve) stenosis: Secondary | ICD-10-CM | POA: Diagnosis not present

## 2016-12-04 DIAGNOSIS — G4733 Obstructive sleep apnea (adult) (pediatric): Secondary | ICD-10-CM | POA: Diagnosis not present

## 2016-12-09 DIAGNOSIS — I499 Cardiac arrhythmia, unspecified: Secondary | ICD-10-CM | POA: Diagnosis not present

## 2016-12-09 DIAGNOSIS — G4733 Obstructive sleep apnea (adult) (pediatric): Secondary | ICD-10-CM | POA: Diagnosis not present

## 2016-12-18 DIAGNOSIS — I350 Nonrheumatic aortic (valve) stenosis: Secondary | ICD-10-CM | POA: Diagnosis not present

## 2016-12-18 DIAGNOSIS — Z7902 Long term (current) use of antithrombotics/antiplatelets: Secondary | ICD-10-CM | POA: Diagnosis not present

## 2016-12-18 DIAGNOSIS — Z79899 Other long term (current) drug therapy: Secondary | ICD-10-CM | POA: Diagnosis not present

## 2016-12-18 DIAGNOSIS — Z7901 Long term (current) use of anticoagulants: Secondary | ICD-10-CM | POA: Diagnosis not present

## 2016-12-18 DIAGNOSIS — Z952 Presence of prosthetic heart valve: Secondary | ICD-10-CM | POA: Diagnosis not present

## 2016-12-18 DIAGNOSIS — E669 Obesity, unspecified: Secondary | ICD-10-CM | POA: Diagnosis not present

## 2016-12-18 DIAGNOSIS — G4733 Obstructive sleep apnea (adult) (pediatric): Secondary | ICD-10-CM | POA: Diagnosis not present

## 2016-12-18 DIAGNOSIS — Z87891 Personal history of nicotine dependence: Secondary | ICD-10-CM | POA: Diagnosis not present

## 2016-12-18 DIAGNOSIS — G8929 Other chronic pain: Secondary | ICD-10-CM | POA: Diagnosis not present

## 2016-12-18 DIAGNOSIS — Z7982 Long term (current) use of aspirin: Secondary | ICD-10-CM | POA: Diagnosis not present

## 2016-12-18 DIAGNOSIS — I4891 Unspecified atrial fibrillation: Secondary | ICD-10-CM | POA: Diagnosis not present

## 2016-12-18 DIAGNOSIS — E8770 Fluid overload, unspecified: Secondary | ICD-10-CM | POA: Diagnosis not present

## 2016-12-18 DIAGNOSIS — Z9884 Bariatric surgery status: Secondary | ICD-10-CM | POA: Diagnosis not present

## 2016-12-18 DIAGNOSIS — I480 Paroxysmal atrial fibrillation: Secondary | ICD-10-CM | POA: Diagnosis not present

## 2016-12-18 DIAGNOSIS — I493 Ventricular premature depolarization: Secondary | ICD-10-CM | POA: Diagnosis not present

## 2016-12-18 DIAGNOSIS — I10 Essential (primary) hypertension: Secondary | ICD-10-CM | POA: Diagnosis not present

## 2016-12-19 DIAGNOSIS — G8929 Other chronic pain: Secondary | ICD-10-CM | POA: Diagnosis not present

## 2016-12-19 DIAGNOSIS — G4733 Obstructive sleep apnea (adult) (pediatric): Secondary | ICD-10-CM | POA: Diagnosis not present

## 2016-12-19 DIAGNOSIS — Z952 Presence of prosthetic heart valve: Secondary | ICD-10-CM | POA: Diagnosis not present

## 2016-12-19 DIAGNOSIS — I4891 Unspecified atrial fibrillation: Secondary | ICD-10-CM | POA: Diagnosis not present

## 2016-12-19 DIAGNOSIS — I493 Ventricular premature depolarization: Secondary | ICD-10-CM | POA: Diagnosis not present

## 2016-12-19 DIAGNOSIS — E8770 Fluid overload, unspecified: Secondary | ICD-10-CM | POA: Diagnosis not present

## 2016-12-19 DIAGNOSIS — I480 Paroxysmal atrial fibrillation: Secondary | ICD-10-CM | POA: Diagnosis not present

## 2016-12-19 DIAGNOSIS — Z9884 Bariatric surgery status: Secondary | ICD-10-CM | POA: Diagnosis not present

## 2016-12-19 DIAGNOSIS — I350 Nonrheumatic aortic (valve) stenosis: Secondary | ICD-10-CM | POA: Diagnosis not present

## 2016-12-19 DIAGNOSIS — R000 Tachycardia, unspecified: Secondary | ICD-10-CM | POA: Diagnosis not present

## 2016-12-19 DIAGNOSIS — I498 Other specified cardiac arrhythmias: Secondary | ICD-10-CM | POA: Diagnosis not present

## 2016-12-19 DIAGNOSIS — Z79899 Other long term (current) drug therapy: Secondary | ICD-10-CM | POA: Diagnosis not present

## 2016-12-19 DIAGNOSIS — I77819 Aortic ectasia, unspecified site: Secondary | ICD-10-CM | POA: Diagnosis not present

## 2016-12-19 DIAGNOSIS — E669 Obesity, unspecified: Secondary | ICD-10-CM | POA: Diagnosis not present

## 2016-12-19 DIAGNOSIS — R9431 Abnormal electrocardiogram [ECG] [EKG]: Secondary | ICD-10-CM | POA: Diagnosis not present

## 2016-12-19 DIAGNOSIS — I10 Essential (primary) hypertension: Secondary | ICD-10-CM | POA: Diagnosis not present

## 2016-12-19 DIAGNOSIS — Z7902 Long term (current) use of antithrombotics/antiplatelets: Secondary | ICD-10-CM | POA: Diagnosis not present

## 2016-12-19 DIAGNOSIS — Z87891 Personal history of nicotine dependence: Secondary | ICD-10-CM | POA: Diagnosis not present

## 2016-12-20 DIAGNOSIS — Z7902 Long term (current) use of antithrombotics/antiplatelets: Secondary | ICD-10-CM | POA: Diagnosis not present

## 2016-12-20 DIAGNOSIS — Z87891 Personal history of nicotine dependence: Secondary | ICD-10-CM | POA: Diagnosis not present

## 2016-12-20 DIAGNOSIS — G8929 Other chronic pain: Secondary | ICD-10-CM | POA: Diagnosis not present

## 2016-12-20 DIAGNOSIS — Z9889 Other specified postprocedural states: Secondary | ICD-10-CM | POA: Diagnosis not present

## 2016-12-20 DIAGNOSIS — E669 Obesity, unspecified: Secondary | ICD-10-CM | POA: Diagnosis not present

## 2016-12-20 DIAGNOSIS — E8770 Fluid overload, unspecified: Secondary | ICD-10-CM | POA: Diagnosis not present

## 2016-12-20 DIAGNOSIS — Z5181 Encounter for therapeutic drug level monitoring: Secondary | ICD-10-CM | POA: Diagnosis not present

## 2016-12-20 DIAGNOSIS — I350 Nonrheumatic aortic (valve) stenosis: Secondary | ICD-10-CM | POA: Diagnosis not present

## 2016-12-20 DIAGNOSIS — I480 Paroxysmal atrial fibrillation: Secondary | ICD-10-CM | POA: Diagnosis not present

## 2016-12-20 DIAGNOSIS — Z952 Presence of prosthetic heart valve: Secondary | ICD-10-CM | POA: Diagnosis not present

## 2016-12-20 DIAGNOSIS — I493 Ventricular premature depolarization: Secondary | ICD-10-CM | POA: Diagnosis not present

## 2016-12-20 DIAGNOSIS — Z9884 Bariatric surgery status: Secondary | ICD-10-CM | POA: Diagnosis not present

## 2016-12-20 DIAGNOSIS — M1990 Unspecified osteoarthritis, unspecified site: Secondary | ICD-10-CM | POA: Diagnosis not present

## 2016-12-20 DIAGNOSIS — G4733 Obstructive sleep apnea (adult) (pediatric): Secondary | ICD-10-CM | POA: Diagnosis not present

## 2016-12-20 DIAGNOSIS — Z79899 Other long term (current) drug therapy: Secondary | ICD-10-CM | POA: Diagnosis not present

## 2016-12-20 DIAGNOSIS — I481 Persistent atrial fibrillation: Secondary | ICD-10-CM | POA: Diagnosis not present

## 2016-12-20 DIAGNOSIS — I10 Essential (primary) hypertension: Secondary | ICD-10-CM | POA: Diagnosis not present

## 2016-12-24 DIAGNOSIS — I499 Cardiac arrhythmia, unspecified: Secondary | ICD-10-CM | POA: Diagnosis not present

## 2017-01-02 DIAGNOSIS — G4733 Obstructive sleep apnea (adult) (pediatric): Secondary | ICD-10-CM | POA: Diagnosis not present

## 2017-01-08 DIAGNOSIS — G4733 Obstructive sleep apnea (adult) (pediatric): Secondary | ICD-10-CM | POA: Diagnosis not present

## 2017-01-08 DIAGNOSIS — G894 Chronic pain syndrome: Secondary | ICD-10-CM | POA: Diagnosis not present

## 2017-01-08 DIAGNOSIS — M159 Polyosteoarthritis, unspecified: Secondary | ICD-10-CM | POA: Diagnosis not present

## 2017-01-08 DIAGNOSIS — I4891 Unspecified atrial fibrillation: Secondary | ICD-10-CM | POA: Diagnosis not present

## 2017-01-14 DIAGNOSIS — G4733 Obstructive sleep apnea (adult) (pediatric): Secondary | ICD-10-CM | POA: Diagnosis not present

## 2017-01-14 DIAGNOSIS — Z9989 Dependence on other enabling machines and devices: Secondary | ICD-10-CM | POA: Diagnosis not present

## 2017-01-14 DIAGNOSIS — R0609 Other forms of dyspnea: Secondary | ICD-10-CM | POA: Diagnosis not present

## 2017-01-14 DIAGNOSIS — Z952 Presence of prosthetic heart valve: Secondary | ICD-10-CM | POA: Diagnosis not present

## 2017-01-28 DIAGNOSIS — E559 Vitamin D deficiency, unspecified: Secondary | ICD-10-CM | POA: Diagnosis not present

## 2017-01-28 DIAGNOSIS — Z79899 Other long term (current) drug therapy: Secondary | ICD-10-CM | POA: Diagnosis not present

## 2017-01-28 DIAGNOSIS — E782 Mixed hyperlipidemia: Secondary | ICD-10-CM | POA: Diagnosis not present

## 2017-01-28 DIAGNOSIS — I10 Essential (primary) hypertension: Secondary | ICD-10-CM | POA: Diagnosis not present

## 2017-01-28 DIAGNOSIS — R739 Hyperglycemia, unspecified: Secondary | ICD-10-CM | POA: Diagnosis not present

## 2017-02-02 DIAGNOSIS — G4733 Obstructive sleep apnea (adult) (pediatric): Secondary | ICD-10-CM | POA: Diagnosis not present

## 2017-02-03 DIAGNOSIS — Z952 Presence of prosthetic heart valve: Secondary | ICD-10-CM | POA: Diagnosis not present

## 2017-02-04 DIAGNOSIS — Z952 Presence of prosthetic heart valve: Secondary | ICD-10-CM | POA: Diagnosis not present

## 2017-02-05 DIAGNOSIS — R5383 Other fatigue: Secondary | ICD-10-CM | POA: Diagnosis not present

## 2017-02-05 DIAGNOSIS — R000 Tachycardia, unspecified: Secondary | ICD-10-CM | POA: Diagnosis not present

## 2017-02-05 DIAGNOSIS — Z952 Presence of prosthetic heart valve: Secondary | ICD-10-CM | POA: Diagnosis not present

## 2017-02-05 DIAGNOSIS — I350 Nonrheumatic aortic (valve) stenosis: Secondary | ICD-10-CM | POA: Diagnosis not present

## 2017-02-09 DIAGNOSIS — Z952 Presence of prosthetic heart valve: Secondary | ICD-10-CM | POA: Diagnosis not present

## 2017-02-11 DIAGNOSIS — Z952 Presence of prosthetic heart valve: Secondary | ICD-10-CM | POA: Diagnosis not present

## 2017-02-12 DIAGNOSIS — Z952 Presence of prosthetic heart valve: Secondary | ICD-10-CM | POA: Diagnosis not present

## 2017-02-16 DIAGNOSIS — Z952 Presence of prosthetic heart valve: Secondary | ICD-10-CM | POA: Diagnosis not present

## 2017-02-18 DIAGNOSIS — Z952 Presence of prosthetic heart valve: Secondary | ICD-10-CM | POA: Diagnosis not present

## 2017-02-19 DIAGNOSIS — Z952 Presence of prosthetic heart valve: Secondary | ICD-10-CM | POA: Diagnosis not present

## 2017-02-23 DIAGNOSIS — Z952 Presence of prosthetic heart valve: Secondary | ICD-10-CM | POA: Diagnosis not present

## 2017-02-26 DIAGNOSIS — Z952 Presence of prosthetic heart valve: Secondary | ICD-10-CM | POA: Diagnosis not present

## 2017-03-02 DIAGNOSIS — Z952 Presence of prosthetic heart valve: Secondary | ICD-10-CM | POA: Diagnosis not present

## 2017-03-04 DIAGNOSIS — Z952 Presence of prosthetic heart valve: Secondary | ICD-10-CM | POA: Diagnosis not present

## 2017-03-05 DIAGNOSIS — Z952 Presence of prosthetic heart valve: Secondary | ICD-10-CM | POA: Diagnosis not present

## 2017-03-05 DIAGNOSIS — G4733 Obstructive sleep apnea (adult) (pediatric): Secondary | ICD-10-CM | POA: Diagnosis not present

## 2017-03-09 DIAGNOSIS — Z952 Presence of prosthetic heart valve: Secondary | ICD-10-CM | POA: Diagnosis not present

## 2017-03-11 DIAGNOSIS — Z952 Presence of prosthetic heart valve: Secondary | ICD-10-CM | POA: Diagnosis not present

## 2017-03-12 DIAGNOSIS — Z952 Presence of prosthetic heart valve: Secondary | ICD-10-CM | POA: Diagnosis not present

## 2017-03-18 DIAGNOSIS — Z952 Presence of prosthetic heart valve: Secondary | ICD-10-CM | POA: Diagnosis not present

## 2017-03-19 DIAGNOSIS — Z952 Presence of prosthetic heart valve: Secondary | ICD-10-CM | POA: Diagnosis not present

## 2017-03-23 DIAGNOSIS — Z952 Presence of prosthetic heart valve: Secondary | ICD-10-CM | POA: Diagnosis not present

## 2017-03-25 DIAGNOSIS — Z952 Presence of prosthetic heart valve: Secondary | ICD-10-CM | POA: Diagnosis not present

## 2017-03-26 DIAGNOSIS — Z952 Presence of prosthetic heart valve: Secondary | ICD-10-CM | POA: Diagnosis not present

## 2017-03-27 DIAGNOSIS — M5442 Lumbago with sciatica, left side: Secondary | ICD-10-CM | POA: Diagnosis not present

## 2017-03-27 DIAGNOSIS — Z5181 Encounter for therapeutic drug level monitoring: Secondary | ICD-10-CM | POA: Diagnosis not present

## 2017-03-27 DIAGNOSIS — G894 Chronic pain syndrome: Secondary | ICD-10-CM | POA: Diagnosis not present

## 2017-03-27 DIAGNOSIS — Z79899 Other long term (current) drug therapy: Secondary | ICD-10-CM | POA: Diagnosis not present

## 2017-03-27 DIAGNOSIS — M961 Postlaminectomy syndrome, not elsewhere classified: Secondary | ICD-10-CM | POA: Diagnosis not present

## 2017-03-27 DIAGNOSIS — Y990 Civilian activity done for income or pay: Secondary | ICD-10-CM | POA: Diagnosis not present

## 2017-03-30 DIAGNOSIS — Z952 Presence of prosthetic heart valve: Secondary | ICD-10-CM | POA: Diagnosis not present

## 2017-04-01 DIAGNOSIS — Z952 Presence of prosthetic heart valve: Secondary | ICD-10-CM | POA: Diagnosis not present

## 2017-04-02 DIAGNOSIS — G4733 Obstructive sleep apnea (adult) (pediatric): Secondary | ICD-10-CM | POA: Diagnosis not present

## 2017-04-02 DIAGNOSIS — Z952 Presence of prosthetic heart valve: Secondary | ICD-10-CM | POA: Diagnosis not present

## 2017-04-06 DIAGNOSIS — Z952 Presence of prosthetic heart valve: Secondary | ICD-10-CM | POA: Diagnosis not present

## 2017-04-08 DIAGNOSIS — I272 Pulmonary hypertension, unspecified: Secondary | ICD-10-CM | POA: Diagnosis not present

## 2017-04-08 DIAGNOSIS — G4733 Obstructive sleep apnea (adult) (pediatric): Secondary | ICD-10-CM | POA: Diagnosis not present

## 2017-04-08 DIAGNOSIS — Z952 Presence of prosthetic heart valve: Secondary | ICD-10-CM | POA: Diagnosis not present

## 2017-04-08 DIAGNOSIS — M159 Polyosteoarthritis, unspecified: Secondary | ICD-10-CM | POA: Diagnosis not present

## 2017-04-08 DIAGNOSIS — I4891 Unspecified atrial fibrillation: Secondary | ICD-10-CM | POA: Diagnosis not present

## 2017-04-09 DIAGNOSIS — Z952 Presence of prosthetic heart valve: Secondary | ICD-10-CM | POA: Diagnosis not present

## 2017-04-13 DIAGNOSIS — Z952 Presence of prosthetic heart valve: Secondary | ICD-10-CM | POA: Diagnosis not present

## 2017-04-15 DIAGNOSIS — Z952 Presence of prosthetic heart valve: Secondary | ICD-10-CM | POA: Diagnosis not present

## 2017-04-16 DIAGNOSIS — Z952 Presence of prosthetic heart valve: Secondary | ICD-10-CM | POA: Diagnosis not present

## 2017-04-20 DIAGNOSIS — Z952 Presence of prosthetic heart valve: Secondary | ICD-10-CM | POA: Diagnosis not present

## 2017-04-22 DIAGNOSIS — Z952 Presence of prosthetic heart valve: Secondary | ICD-10-CM | POA: Diagnosis not present

## 2017-04-23 DIAGNOSIS — Z952 Presence of prosthetic heart valve: Secondary | ICD-10-CM | POA: Diagnosis not present

## 2017-04-27 DIAGNOSIS — Z952 Presence of prosthetic heart valve: Secondary | ICD-10-CM | POA: Diagnosis not present

## 2017-04-29 DIAGNOSIS — Z952 Presence of prosthetic heart valve: Secondary | ICD-10-CM | POA: Diagnosis not present

## 2017-05-03 DIAGNOSIS — G4733 Obstructive sleep apnea (adult) (pediatric): Secondary | ICD-10-CM | POA: Diagnosis not present

## 2017-05-05 DIAGNOSIS — E782 Mixed hyperlipidemia: Secondary | ICD-10-CM | POA: Diagnosis not present

## 2017-05-05 DIAGNOSIS — Z79899 Other long term (current) drug therapy: Secondary | ICD-10-CM | POA: Diagnosis not present

## 2017-05-05 DIAGNOSIS — R739 Hyperglycemia, unspecified: Secondary | ICD-10-CM | POA: Diagnosis not present

## 2017-05-05 DIAGNOSIS — I10 Essential (primary) hypertension: Secondary | ICD-10-CM | POA: Diagnosis not present

## 2017-05-05 DIAGNOSIS — E559 Vitamin D deficiency, unspecified: Secondary | ICD-10-CM | POA: Diagnosis not present

## 2017-05-07 DIAGNOSIS — Z79899 Other long term (current) drug therapy: Secondary | ICD-10-CM | POA: Diagnosis not present

## 2017-05-07 DIAGNOSIS — Z09 Encounter for follow-up examination after completed treatment for conditions other than malignant neoplasm: Secondary | ICD-10-CM | POA: Diagnosis not present

## 2017-05-07 DIAGNOSIS — Z48812 Encounter for surgical aftercare following surgery on the circulatory system: Secondary | ICD-10-CM | POA: Diagnosis not present

## 2017-05-07 DIAGNOSIS — R0602 Shortness of breath: Secondary | ICD-10-CM | POA: Diagnosis not present

## 2017-05-07 DIAGNOSIS — R011 Cardiac murmur, unspecified: Secondary | ICD-10-CM | POA: Diagnosis not present

## 2017-05-07 DIAGNOSIS — Z952 Presence of prosthetic heart valve: Secondary | ICD-10-CM | POA: Diagnosis not present

## 2017-05-07 DIAGNOSIS — Z7982 Long term (current) use of aspirin: Secondary | ICD-10-CM | POA: Diagnosis not present

## 2017-06-02 DIAGNOSIS — G4733 Obstructive sleep apnea (adult) (pediatric): Secondary | ICD-10-CM | POA: Diagnosis not present

## 2017-06-19 DIAGNOSIS — M5417 Radiculopathy, lumbosacral region: Secondary | ICD-10-CM | POA: Diagnosis not present

## 2017-06-19 DIAGNOSIS — M5442 Lumbago with sciatica, left side: Secondary | ICD-10-CM | POA: Diagnosis not present

## 2017-06-19 DIAGNOSIS — M961 Postlaminectomy syndrome, not elsewhere classified: Secondary | ICD-10-CM | POA: Diagnosis not present

## 2017-06-19 DIAGNOSIS — G894 Chronic pain syndrome: Secondary | ICD-10-CM | POA: Diagnosis not present

## 2017-07-03 DIAGNOSIS — G4733 Obstructive sleep apnea (adult) (pediatric): Secondary | ICD-10-CM | POA: Diagnosis not present

## 2017-08-02 DIAGNOSIS — G4733 Obstructive sleep apnea (adult) (pediatric): Secondary | ICD-10-CM | POA: Diagnosis not present

## 2017-08-04 DIAGNOSIS — Z79899 Other long term (current) drug therapy: Secondary | ICD-10-CM | POA: Diagnosis not present

## 2017-08-04 DIAGNOSIS — E782 Mixed hyperlipidemia: Secondary | ICD-10-CM | POA: Diagnosis not present

## 2017-08-04 DIAGNOSIS — E559 Vitamin D deficiency, unspecified: Secondary | ICD-10-CM | POA: Diagnosis not present

## 2017-08-04 DIAGNOSIS — R739 Hyperglycemia, unspecified: Secondary | ICD-10-CM | POA: Diagnosis not present

## 2017-08-04 DIAGNOSIS — M549 Dorsalgia, unspecified: Secondary | ICD-10-CM | POA: Diagnosis not present

## 2017-08-05 DIAGNOSIS — Z952 Presence of prosthetic heart valve: Secondary | ICD-10-CM | POA: Diagnosis not present

## 2017-08-05 DIAGNOSIS — I4891 Unspecified atrial fibrillation: Secondary | ICD-10-CM | POA: Diagnosis not present

## 2017-08-05 DIAGNOSIS — I350 Nonrheumatic aortic (valve) stenosis: Secondary | ICD-10-CM | POA: Diagnosis not present

## 2017-08-05 DIAGNOSIS — I480 Paroxysmal atrial fibrillation: Secondary | ICD-10-CM | POA: Diagnosis not present

## 2017-08-05 DIAGNOSIS — I10 Essential (primary) hypertension: Secondary | ICD-10-CM | POA: Diagnosis not present

## 2017-09-02 DIAGNOSIS — G4733 Obstructive sleep apnea (adult) (pediatric): Secondary | ICD-10-CM | POA: Diagnosis not present

## 2017-09-11 DIAGNOSIS — G894 Chronic pain syndrome: Secondary | ICD-10-CM | POA: Diagnosis not present

## 2017-09-11 DIAGNOSIS — M961 Postlaminectomy syndrome, not elsewhere classified: Secondary | ICD-10-CM | POA: Diagnosis not present

## 2017-09-11 DIAGNOSIS — M5442 Lumbago with sciatica, left side: Secondary | ICD-10-CM | POA: Diagnosis not present

## 2017-09-11 DIAGNOSIS — M5417 Radiculopathy, lumbosacral region: Secondary | ICD-10-CM | POA: Diagnosis not present

## 2017-10-03 DIAGNOSIS — G4733 Obstructive sleep apnea (adult) (pediatric): Secondary | ICD-10-CM | POA: Diagnosis not present

## 2017-10-14 DIAGNOSIS — G894 Chronic pain syndrome: Secondary | ICD-10-CM | POA: Diagnosis not present

## 2017-10-14 DIAGNOSIS — I10 Essential (primary) hypertension: Secondary | ICD-10-CM | POA: Diagnosis not present

## 2017-10-14 DIAGNOSIS — I350 Nonrheumatic aortic (valve) stenosis: Secondary | ICD-10-CM | POA: Diagnosis not present

## 2017-10-14 DIAGNOSIS — G4733 Obstructive sleep apnea (adult) (pediatric): Secondary | ICD-10-CM | POA: Diagnosis not present

## 2017-10-23 DIAGNOSIS — G4733 Obstructive sleep apnea (adult) (pediatric): Secondary | ICD-10-CM | POA: Diagnosis not present

## 2017-10-29 DIAGNOSIS — R05 Cough: Secondary | ICD-10-CM | POA: Diagnosis not present

## 2017-10-29 DIAGNOSIS — Z7902 Long term (current) use of antithrombotics/antiplatelets: Secondary | ICD-10-CM | POA: Diagnosis not present

## 2017-10-29 DIAGNOSIS — R7881 Bacteremia: Secondary | ICD-10-CM | POA: Diagnosis not present

## 2017-10-29 DIAGNOSIS — I350 Nonrheumatic aortic (valve) stenosis: Secondary | ICD-10-CM | POA: Diagnosis not present

## 2017-10-29 DIAGNOSIS — D72829 Elevated white blood cell count, unspecified: Secondary | ICD-10-CM | POA: Diagnosis not present

## 2017-10-29 DIAGNOSIS — N202 Calculus of kidney with calculus of ureter: Secondary | ICD-10-CM | POA: Diagnosis not present

## 2017-10-29 DIAGNOSIS — G4733 Obstructive sleep apnea (adult) (pediatric): Secondary | ICD-10-CM | POA: Diagnosis not present

## 2017-10-29 DIAGNOSIS — R1031 Right lower quadrant pain: Secondary | ICD-10-CM | POA: Diagnosis not present

## 2017-10-29 DIAGNOSIS — Z952 Presence of prosthetic heart valve: Secondary | ICD-10-CM | POA: Diagnosis not present

## 2017-10-29 DIAGNOSIS — B952 Enterococcus as the cause of diseases classified elsewhere: Secondary | ICD-10-CM | POA: Diagnosis not present

## 2017-10-29 DIAGNOSIS — I081 Rheumatic disorders of both mitral and tricuspid valves: Secondary | ICD-10-CM | POA: Diagnosis not present

## 2017-10-29 DIAGNOSIS — I252 Old myocardial infarction: Secondary | ICD-10-CM | POA: Diagnosis not present

## 2017-10-29 DIAGNOSIS — J069 Acute upper respiratory infection, unspecified: Secondary | ICD-10-CM | POA: Diagnosis not present

## 2017-10-29 DIAGNOSIS — I480 Paroxysmal atrial fibrillation: Secondary | ICD-10-CM | POA: Diagnosis not present

## 2017-10-29 DIAGNOSIS — N200 Calculus of kidney: Secondary | ICD-10-CM | POA: Diagnosis not present

## 2017-10-29 DIAGNOSIS — Z452 Encounter for adjustment and management of vascular access device: Secondary | ICD-10-CM | POA: Diagnosis not present

## 2017-10-29 DIAGNOSIS — Z7982 Long term (current) use of aspirin: Secondary | ICD-10-CM | POA: Diagnosis not present

## 2017-10-29 DIAGNOSIS — N390 Urinary tract infection, site not specified: Secondary | ICD-10-CM | POA: Diagnosis not present

## 2017-10-29 DIAGNOSIS — A4181 Sepsis due to Enterococcus: Secondary | ICD-10-CM | POA: Diagnosis not present

## 2017-10-29 DIAGNOSIS — N179 Acute kidney failure, unspecified: Secondary | ICD-10-CM | POA: Diagnosis not present

## 2017-10-29 DIAGNOSIS — Z96659 Presence of unspecified artificial knee joint: Secondary | ICD-10-CM | POA: Diagnosis not present

## 2017-10-29 DIAGNOSIS — N132 Hydronephrosis with renal and ureteral calculous obstruction: Secondary | ICD-10-CM | POA: Diagnosis not present

## 2017-10-29 DIAGNOSIS — I2510 Atherosclerotic heart disease of native coronary artery without angina pectoris: Secondary | ICD-10-CM | POA: Diagnosis not present

## 2017-10-29 DIAGNOSIS — M7660 Achilles tendinitis, unspecified leg: Secondary | ICD-10-CM | POA: Diagnosis not present

## 2017-10-29 DIAGNOSIS — Z6841 Body Mass Index (BMI) 40.0 and over, adult: Secondary | ICD-10-CM | POA: Diagnosis not present

## 2017-10-29 DIAGNOSIS — N201 Calculus of ureter: Secondary | ICD-10-CM | POA: Diagnosis not present

## 2017-10-29 DIAGNOSIS — R509 Fever, unspecified: Secondary | ICD-10-CM | POA: Diagnosis not present

## 2017-10-29 DIAGNOSIS — R531 Weakness: Secondary | ICD-10-CM | POA: Diagnosis not present

## 2017-10-29 DIAGNOSIS — I10 Essential (primary) hypertension: Secondary | ICD-10-CM | POA: Diagnosis not present

## 2017-10-29 DIAGNOSIS — R110 Nausea: Secondary | ICD-10-CM | POA: Diagnosis not present

## 2017-10-29 DIAGNOSIS — R0602 Shortness of breath: Secondary | ICD-10-CM | POA: Diagnosis not present

## 2017-10-29 DIAGNOSIS — E119 Type 2 diabetes mellitus without complications: Secondary | ICD-10-CM | POA: Diagnosis not present

## 2017-10-29 DIAGNOSIS — Z87891 Personal history of nicotine dependence: Secondary | ICD-10-CM | POA: Diagnosis not present

## 2017-10-29 DIAGNOSIS — R10823 Right lower quadrant rebound abdominal tenderness: Secondary | ICD-10-CM | POA: Diagnosis not present

## 2017-11-06 DIAGNOSIS — M7660 Achilles tendinitis, unspecified leg: Secondary | ICD-10-CM | POA: Diagnosis not present

## 2017-11-06 DIAGNOSIS — R7881 Bacteremia: Secondary | ICD-10-CM | POA: Diagnosis not present

## 2017-11-06 DIAGNOSIS — A4181 Sepsis due to Enterococcus: Secondary | ICD-10-CM | POA: Diagnosis not present

## 2017-11-06 DIAGNOSIS — Z96659 Presence of unspecified artificial knee joint: Secondary | ICD-10-CM | POA: Diagnosis not present

## 2017-11-09 DIAGNOSIS — M7660 Achilles tendinitis, unspecified leg: Secondary | ICD-10-CM | POA: Diagnosis not present

## 2017-11-09 DIAGNOSIS — A4181 Sepsis due to Enterococcus: Secondary | ICD-10-CM | POA: Diagnosis not present

## 2017-11-09 DIAGNOSIS — N200 Calculus of kidney: Secondary | ICD-10-CM | POA: Diagnosis not present

## 2017-11-09 DIAGNOSIS — R7881 Bacteremia: Secondary | ICD-10-CM | POA: Diagnosis not present

## 2017-11-09 DIAGNOSIS — Z960 Presence of urogenital implants: Secondary | ICD-10-CM | POA: Diagnosis not present

## 2017-11-09 DIAGNOSIS — Z466 Encounter for fitting and adjustment of urinary device: Secondary | ICD-10-CM | POA: Diagnosis not present

## 2017-11-09 DIAGNOSIS — N201 Calculus of ureter: Secondary | ICD-10-CM | POA: Diagnosis not present

## 2017-11-09 DIAGNOSIS — Z96659 Presence of unspecified artificial knee joint: Secondary | ICD-10-CM | POA: Diagnosis not present

## 2017-11-10 DIAGNOSIS — N201 Calculus of ureter: Secondary | ICD-10-CM | POA: Diagnosis not present

## 2017-11-10 DIAGNOSIS — N200 Calculus of kidney: Secondary | ICD-10-CM | POA: Diagnosis not present

## 2017-11-10 DIAGNOSIS — M8610 Other acute osteomyelitis, unspecified site: Secondary | ICD-10-CM | POA: Diagnosis not present

## 2017-11-10 DIAGNOSIS — Z960 Presence of urogenital implants: Secondary | ICD-10-CM | POA: Diagnosis not present

## 2017-11-10 DIAGNOSIS — Z466 Encounter for fitting and adjustment of urinary device: Secondary | ICD-10-CM | POA: Diagnosis not present

## 2017-11-11 DIAGNOSIS — G4733 Obstructive sleep apnea (adult) (pediatric): Secondary | ICD-10-CM | POA: Diagnosis not present

## 2017-11-12 DIAGNOSIS — A4181 Sepsis due to Enterococcus: Secondary | ICD-10-CM | POA: Diagnosis not present

## 2017-11-12 DIAGNOSIS — R7881 Bacteremia: Secondary | ICD-10-CM | POA: Diagnosis not present

## 2017-11-12 DIAGNOSIS — M7660 Achilles tendinitis, unspecified leg: Secondary | ICD-10-CM | POA: Diagnosis not present

## 2017-11-12 DIAGNOSIS — Z96659 Presence of unspecified artificial knee joint: Secondary | ICD-10-CM | POA: Diagnosis not present

## 2017-11-14 DIAGNOSIS — Z96659 Presence of unspecified artificial knee joint: Secondary | ICD-10-CM | POA: Diagnosis not present

## 2017-11-14 DIAGNOSIS — R7881 Bacteremia: Secondary | ICD-10-CM | POA: Diagnosis not present

## 2017-11-14 DIAGNOSIS — A4181 Sepsis due to Enterococcus: Secondary | ICD-10-CM | POA: Diagnosis not present

## 2017-11-14 DIAGNOSIS — M7660 Achilles tendinitis, unspecified leg: Secondary | ICD-10-CM | POA: Diagnosis not present

## 2017-11-16 DIAGNOSIS — Z96659 Presence of unspecified artificial knee joint: Secondary | ICD-10-CM | POA: Diagnosis not present

## 2017-11-16 DIAGNOSIS — M7660 Achilles tendinitis, unspecified leg: Secondary | ICD-10-CM | POA: Diagnosis not present

## 2017-11-16 DIAGNOSIS — R7881 Bacteremia: Secondary | ICD-10-CM | POA: Diagnosis not present

## 2017-11-17 DIAGNOSIS — A4181 Sepsis due to Enterococcus: Secondary | ICD-10-CM | POA: Diagnosis not present

## 2017-11-18 DIAGNOSIS — N139 Obstructive and reflux uropathy, unspecified: Secondary | ICD-10-CM | POA: Diagnosis not present

## 2017-11-18 DIAGNOSIS — I350 Nonrheumatic aortic (valve) stenosis: Secondary | ICD-10-CM | POA: Diagnosis not present

## 2017-11-18 DIAGNOSIS — R21 Rash and other nonspecific skin eruption: Secondary | ICD-10-CM | POA: Diagnosis not present

## 2017-11-18 DIAGNOSIS — Z952 Presence of prosthetic heart valve: Secondary | ICD-10-CM | POA: Diagnosis not present

## 2017-11-18 DIAGNOSIS — N390 Urinary tract infection, site not specified: Secondary | ICD-10-CM | POA: Diagnosis not present

## 2017-11-18 DIAGNOSIS — Z23 Encounter for immunization: Secondary | ICD-10-CM | POA: Diagnosis not present

## 2017-11-18 DIAGNOSIS — A4181 Sepsis due to Enterococcus: Secondary | ICD-10-CM | POA: Diagnosis not present

## 2017-11-19 DIAGNOSIS — M7660 Achilles tendinitis, unspecified leg: Secondary | ICD-10-CM | POA: Diagnosis not present

## 2017-11-19 DIAGNOSIS — Z952 Presence of prosthetic heart valve: Secondary | ICD-10-CM | POA: Diagnosis not present

## 2017-11-19 DIAGNOSIS — R7881 Bacteremia: Secondary | ICD-10-CM | POA: Diagnosis not present

## 2017-11-19 DIAGNOSIS — Z96659 Presence of unspecified artificial knee joint: Secondary | ICD-10-CM | POA: Diagnosis not present

## 2017-11-21 DIAGNOSIS — R7881 Bacteremia: Secondary | ICD-10-CM | POA: Diagnosis not present

## 2017-11-21 DIAGNOSIS — M7660 Achilles tendinitis, unspecified leg: Secondary | ICD-10-CM | POA: Diagnosis not present

## 2017-11-21 DIAGNOSIS — Z96659 Presence of unspecified artificial knee joint: Secondary | ICD-10-CM | POA: Diagnosis not present

## 2017-11-21 DIAGNOSIS — A4181 Sepsis due to Enterococcus: Secondary | ICD-10-CM | POA: Diagnosis not present

## 2017-11-23 DIAGNOSIS — Z96659 Presence of unspecified artificial knee joint: Secondary | ICD-10-CM | POA: Diagnosis not present

## 2017-11-23 DIAGNOSIS — M7660 Achilles tendinitis, unspecified leg: Secondary | ICD-10-CM | POA: Diagnosis not present

## 2017-11-23 DIAGNOSIS — A4181 Sepsis due to Enterococcus: Secondary | ICD-10-CM | POA: Diagnosis not present

## 2017-11-23 DIAGNOSIS — R7881 Bacteremia: Secondary | ICD-10-CM | POA: Diagnosis not present

## 2017-11-26 DIAGNOSIS — M7660 Achilles tendinitis, unspecified leg: Secondary | ICD-10-CM | POA: Diagnosis not present

## 2017-11-26 DIAGNOSIS — Z96659 Presence of unspecified artificial knee joint: Secondary | ICD-10-CM | POA: Diagnosis not present

## 2017-11-26 DIAGNOSIS — R7881 Bacteremia: Secondary | ICD-10-CM | POA: Diagnosis not present

## 2017-11-28 DIAGNOSIS — N390 Urinary tract infection, site not specified: Secondary | ICD-10-CM | POA: Diagnosis not present

## 2017-11-28 DIAGNOSIS — N139 Obstructive and reflux uropathy, unspecified: Secondary | ICD-10-CM | POA: Diagnosis not present

## 2017-11-28 DIAGNOSIS — R7881 Bacteremia: Secondary | ICD-10-CM | POA: Diagnosis not present

## 2017-11-28 DIAGNOSIS — Z96659 Presence of unspecified artificial knee joint: Secondary | ICD-10-CM | POA: Diagnosis not present

## 2017-11-28 DIAGNOSIS — R21 Rash and other nonspecific skin eruption: Secondary | ICD-10-CM | POA: Diagnosis not present

## 2017-11-28 DIAGNOSIS — A4181 Sepsis due to Enterococcus: Secondary | ICD-10-CM | POA: Diagnosis not present

## 2017-11-28 DIAGNOSIS — M7660 Achilles tendinitis, unspecified leg: Secondary | ICD-10-CM | POA: Diagnosis not present

## 2017-11-30 DIAGNOSIS — M7660 Achilles tendinitis, unspecified leg: Secondary | ICD-10-CM | POA: Diagnosis not present

## 2017-11-30 DIAGNOSIS — Z96659 Presence of unspecified artificial knee joint: Secondary | ICD-10-CM | POA: Diagnosis not present

## 2017-11-30 DIAGNOSIS — A4181 Sepsis due to Enterococcus: Secondary | ICD-10-CM | POA: Diagnosis not present

## 2017-11-30 DIAGNOSIS — N139 Obstructive and reflux uropathy, unspecified: Secondary | ICD-10-CM | POA: Diagnosis not present

## 2017-11-30 DIAGNOSIS — N390 Urinary tract infection, site not specified: Secondary | ICD-10-CM | POA: Diagnosis not present

## 2017-11-30 DIAGNOSIS — R21 Rash and other nonspecific skin eruption: Secondary | ICD-10-CM | POA: Diagnosis not present

## 2017-11-30 DIAGNOSIS — R7881 Bacteremia: Secondary | ICD-10-CM | POA: Diagnosis not present

## 2017-12-03 DIAGNOSIS — R7881 Bacteremia: Secondary | ICD-10-CM | POA: Diagnosis not present

## 2017-12-03 DIAGNOSIS — Z96659 Presence of unspecified artificial knee joint: Secondary | ICD-10-CM | POA: Diagnosis not present

## 2017-12-03 DIAGNOSIS — M7660 Achilles tendinitis, unspecified leg: Secondary | ICD-10-CM | POA: Diagnosis not present

## 2017-12-05 DIAGNOSIS — A4181 Sepsis due to Enterococcus: Secondary | ICD-10-CM | POA: Diagnosis not present

## 2017-12-05 DIAGNOSIS — R7881 Bacteremia: Secondary | ICD-10-CM | POA: Diagnosis not present

## 2017-12-05 DIAGNOSIS — Z96659 Presence of unspecified artificial knee joint: Secondary | ICD-10-CM | POA: Diagnosis not present

## 2017-12-05 DIAGNOSIS — M7660 Achilles tendinitis, unspecified leg: Secondary | ICD-10-CM | POA: Diagnosis not present

## 2017-12-07 DIAGNOSIS — M7660 Achilles tendinitis, unspecified leg: Secondary | ICD-10-CM | POA: Diagnosis not present

## 2017-12-07 DIAGNOSIS — Z96659 Presence of unspecified artificial knee joint: Secondary | ICD-10-CM | POA: Diagnosis not present

## 2017-12-07 DIAGNOSIS — R7881 Bacteremia: Secondary | ICD-10-CM | POA: Diagnosis not present

## 2017-12-08 DIAGNOSIS — A4181 Sepsis due to Enterococcus: Secondary | ICD-10-CM | POA: Diagnosis not present

## 2017-12-08 DIAGNOSIS — Z5181 Encounter for therapeutic drug level monitoring: Secondary | ICD-10-CM | POA: Diagnosis not present

## 2017-12-10 DIAGNOSIS — Z96659 Presence of unspecified artificial knee joint: Secondary | ICD-10-CM | POA: Diagnosis not present

## 2017-12-10 DIAGNOSIS — M7660 Achilles tendinitis, unspecified leg: Secondary | ICD-10-CM | POA: Diagnosis not present

## 2017-12-10 DIAGNOSIS — R7881 Bacteremia: Secondary | ICD-10-CM | POA: Diagnosis not present

## 2017-12-10 DIAGNOSIS — A4181 Sepsis due to Enterococcus: Secondary | ICD-10-CM | POA: Diagnosis not present

## 2017-12-11 DIAGNOSIS — A4181 Sepsis due to Enterococcus: Secondary | ICD-10-CM | POA: Diagnosis not present

## 2017-12-21 DIAGNOSIS — N390 Urinary tract infection, site not specified: Secondary | ICD-10-CM | POA: Diagnosis not present

## 2017-12-21 DIAGNOSIS — A4181 Sepsis due to Enterococcus: Secondary | ICD-10-CM | POA: Diagnosis not present

## 2017-12-21 DIAGNOSIS — N139 Obstructive and reflux uropathy, unspecified: Secondary | ICD-10-CM | POA: Diagnosis not present

## 2017-12-21 DIAGNOSIS — R21 Rash and other nonspecific skin eruption: Secondary | ICD-10-CM | POA: Diagnosis not present

## 2018-01-13 DIAGNOSIS — M5417 Radiculopathy, lumbosacral region: Secondary | ICD-10-CM | POA: Diagnosis not present

## 2018-01-13 DIAGNOSIS — M961 Postlaminectomy syndrome, not elsewhere classified: Secondary | ICD-10-CM | POA: Diagnosis not present

## 2018-01-13 DIAGNOSIS — G894 Chronic pain syndrome: Secondary | ICD-10-CM | POA: Diagnosis not present

## 2018-01-13 DIAGNOSIS — M5442 Lumbago with sciatica, left side: Secondary | ICD-10-CM | POA: Diagnosis not present

## 2018-01-20 DIAGNOSIS — I10 Essential (primary) hypertension: Secondary | ICD-10-CM | POA: Diagnosis not present

## 2018-01-20 DIAGNOSIS — E782 Mixed hyperlipidemia: Secondary | ICD-10-CM | POA: Diagnosis not present

## 2018-01-20 DIAGNOSIS — E559 Vitamin D deficiency, unspecified: Secondary | ICD-10-CM | POA: Diagnosis not present

## 2018-01-20 DIAGNOSIS — Z79899 Other long term (current) drug therapy: Secondary | ICD-10-CM | POA: Diagnosis not present

## 2018-01-20 DIAGNOSIS — R739 Hyperglycemia, unspecified: Secondary | ICD-10-CM | POA: Diagnosis not present

## 2018-02-04 DIAGNOSIS — Z1211 Encounter for screening for malignant neoplasm of colon: Secondary | ICD-10-CM | POA: Diagnosis not present

## 2018-02-24 DIAGNOSIS — I2510 Atherosclerotic heart disease of native coronary artery without angina pectoris: Secondary | ICD-10-CM | POA: Diagnosis not present

## 2018-02-24 DIAGNOSIS — I10 Essential (primary) hypertension: Secondary | ICD-10-CM | POA: Diagnosis not present

## 2018-02-24 DIAGNOSIS — R509 Fever, unspecified: Secondary | ICD-10-CM | POA: Diagnosis not present

## 2018-02-24 DIAGNOSIS — J018 Other acute sinusitis: Secondary | ICD-10-CM | POA: Diagnosis not present

## 2018-03-09 DIAGNOSIS — G8929 Other chronic pain: Secondary | ICD-10-CM | POA: Diagnosis not present

## 2018-03-09 DIAGNOSIS — R51 Headache: Secondary | ICD-10-CM | POA: Diagnosis not present

## 2018-03-09 DIAGNOSIS — M545 Low back pain: Secondary | ICD-10-CM | POA: Diagnosis not present

## 2018-03-22 DIAGNOSIS — Z1211 Encounter for screening for malignant neoplasm of colon: Secondary | ICD-10-CM | POA: Diagnosis not present

## 2018-03-22 DIAGNOSIS — K635 Polyp of colon: Secondary | ICD-10-CM | POA: Diagnosis not present

## 2018-03-22 DIAGNOSIS — Z01818 Encounter for other preprocedural examination: Secondary | ICD-10-CM | POA: Diagnosis not present

## 2018-03-23 DIAGNOSIS — K635 Polyp of colon: Secondary | ICD-10-CM | POA: Diagnosis not present

## 2018-04-07 DIAGNOSIS — M5417 Radiculopathy, lumbosacral region: Secondary | ICD-10-CM | POA: Diagnosis not present

## 2018-04-07 DIAGNOSIS — M5442 Lumbago with sciatica, left side: Secondary | ICD-10-CM | POA: Diagnosis not present

## 2018-04-07 DIAGNOSIS — M961 Postlaminectomy syndrome, not elsewhere classified: Secondary | ICD-10-CM | POA: Diagnosis not present

## 2018-04-07 DIAGNOSIS — G894 Chronic pain syndrome: Secondary | ICD-10-CM | POA: Diagnosis not present

## 2018-04-09 DIAGNOSIS — K635 Polyp of colon: Secondary | ICD-10-CM | POA: Diagnosis not present

## 2018-05-05 DIAGNOSIS — G4733 Obstructive sleep apnea (adult) (pediatric): Secondary | ICD-10-CM | POA: Diagnosis not present

## 2018-06-30 DIAGNOSIS — M5417 Radiculopathy, lumbosacral region: Secondary | ICD-10-CM | POA: Diagnosis not present

## 2018-06-30 DIAGNOSIS — Z5181 Encounter for therapeutic drug level monitoring: Secondary | ICD-10-CM | POA: Diagnosis not present

## 2018-06-30 DIAGNOSIS — G894 Chronic pain syndrome: Secondary | ICD-10-CM | POA: Diagnosis not present

## 2018-06-30 DIAGNOSIS — M961 Postlaminectomy syndrome, not elsewhere classified: Secondary | ICD-10-CM | POA: Diagnosis not present

## 2018-06-30 DIAGNOSIS — M5442 Lumbago with sciatica, left side: Secondary | ICD-10-CM | POA: Diagnosis not present

## 2018-06-30 DIAGNOSIS — Z79899 Other long term (current) drug therapy: Secondary | ICD-10-CM | POA: Diagnosis not present

## 2018-07-13 DIAGNOSIS — E782 Mixed hyperlipidemia: Secondary | ICD-10-CM | POA: Diagnosis not present

## 2018-07-13 DIAGNOSIS — Z79899 Other long term (current) drug therapy: Secondary | ICD-10-CM | POA: Diagnosis not present

## 2018-07-13 DIAGNOSIS — I4891 Unspecified atrial fibrillation: Secondary | ICD-10-CM | POA: Diagnosis not present

## 2018-07-13 DIAGNOSIS — R739 Hyperglycemia, unspecified: Secondary | ICD-10-CM | POA: Diagnosis not present

## 2018-07-13 DIAGNOSIS — E559 Vitamin D deficiency, unspecified: Secondary | ICD-10-CM | POA: Diagnosis not present

## 2018-10-18 DIAGNOSIS — G894 Chronic pain syndrome: Secondary | ICD-10-CM | POA: Diagnosis not present

## 2018-10-18 DIAGNOSIS — I350 Nonrheumatic aortic (valve) stenosis: Secondary | ICD-10-CM | POA: Diagnosis not present

## 2018-10-18 DIAGNOSIS — M159 Polyosteoarthritis, unspecified: Secondary | ICD-10-CM | POA: Diagnosis not present

## 2018-10-18 DIAGNOSIS — G4733 Obstructive sleep apnea (adult) (pediatric): Secondary | ICD-10-CM | POA: Diagnosis not present

## 2018-10-29 DIAGNOSIS — G894 Chronic pain syndrome: Secondary | ICD-10-CM | POA: Diagnosis not present

## 2018-10-29 DIAGNOSIS — M5442 Lumbago with sciatica, left side: Secondary | ICD-10-CM | POA: Diagnosis not present

## 2018-10-29 DIAGNOSIS — M961 Postlaminectomy syndrome, not elsewhere classified: Secondary | ICD-10-CM | POA: Diagnosis not present

## 2018-10-29 DIAGNOSIS — Z5181 Encounter for therapeutic drug level monitoring: Secondary | ICD-10-CM | POA: Diagnosis not present

## 2018-10-29 DIAGNOSIS — Z79899 Other long term (current) drug therapy: Secondary | ICD-10-CM | POA: Diagnosis not present

## 2018-10-29 DIAGNOSIS — M5417 Radiculopathy, lumbosacral region: Secondary | ICD-10-CM | POA: Diagnosis not present

## 2018-11-12 DIAGNOSIS — Z23 Encounter for immunization: Secondary | ICD-10-CM | POA: Diagnosis not present

## 2018-11-15 DIAGNOSIS — M533 Sacrococcygeal disorders, not elsewhere classified: Secondary | ICD-10-CM | POA: Diagnosis not present

## 2018-11-22 DIAGNOSIS — G4733 Obstructive sleep apnea (adult) (pediatric): Secondary | ICD-10-CM | POA: Diagnosis not present

## 2018-11-25 DIAGNOSIS — I2510 Atherosclerotic heart disease of native coronary artery without angina pectoris: Secondary | ICD-10-CM | POA: Diagnosis not present

## 2018-11-25 DIAGNOSIS — I350 Nonrheumatic aortic (valve) stenosis: Secondary | ICD-10-CM | POA: Diagnosis not present

## 2018-11-25 DIAGNOSIS — I480 Paroxysmal atrial fibrillation: Secondary | ICD-10-CM | POA: Diagnosis not present

## 2018-11-25 DIAGNOSIS — Z955 Presence of coronary angioplasty implant and graft: Secondary | ICD-10-CM | POA: Diagnosis not present

## 2019-01-13 DIAGNOSIS — E559 Vitamin D deficiency, unspecified: Secondary | ICD-10-CM | POA: Diagnosis not present

## 2019-01-13 DIAGNOSIS — B351 Tinea unguium: Secondary | ICD-10-CM | POA: Diagnosis not present

## 2019-01-13 DIAGNOSIS — E782 Mixed hyperlipidemia: Secondary | ICD-10-CM | POA: Diagnosis not present

## 2019-01-13 DIAGNOSIS — Z79899 Other long term (current) drug therapy: Secondary | ICD-10-CM | POA: Diagnosis not present

## 2019-01-13 DIAGNOSIS — R739 Hyperglycemia, unspecified: Secondary | ICD-10-CM | POA: Diagnosis not present

## 2019-01-21 DIAGNOSIS — M5442 Lumbago with sciatica, left side: Secondary | ICD-10-CM | POA: Diagnosis not present

## 2019-01-21 DIAGNOSIS — M533 Sacrococcygeal disorders, not elsewhere classified: Secondary | ICD-10-CM | POA: Diagnosis not present

## 2019-01-21 DIAGNOSIS — M5417 Radiculopathy, lumbosacral region: Secondary | ICD-10-CM | POA: Diagnosis not present

## 2019-01-21 DIAGNOSIS — M961 Postlaminectomy syndrome, not elsewhere classified: Secondary | ICD-10-CM | POA: Diagnosis not present

## 2019-02-14 DIAGNOSIS — M961 Postlaminectomy syndrome, not elsewhere classified: Secondary | ICD-10-CM | POA: Diagnosis not present

## 2019-02-14 DIAGNOSIS — M5416 Radiculopathy, lumbar region: Secondary | ICD-10-CM | POA: Diagnosis not present

## 2019-02-14 DIAGNOSIS — M5414 Radiculopathy, thoracic region: Secondary | ICD-10-CM | POA: Diagnosis not present

## 2019-03-05 DIAGNOSIS — G894 Chronic pain syndrome: Secondary | ICD-10-CM | POA: Diagnosis not present

## 2019-03-07 DIAGNOSIS — G894 Chronic pain syndrome: Secondary | ICD-10-CM | POA: Diagnosis not present

## 2019-03-15 DIAGNOSIS — M5127 Other intervertebral disc displacement, lumbosacral region: Secondary | ICD-10-CM | POA: Diagnosis not present

## 2019-03-15 DIAGNOSIS — M961 Postlaminectomy syndrome, not elsewhere classified: Secondary | ICD-10-CM | POA: Diagnosis not present

## 2019-03-15 DIAGNOSIS — M5416 Radiculopathy, lumbar region: Secondary | ICD-10-CM | POA: Diagnosis not present

## 2019-03-15 DIAGNOSIS — M5126 Other intervertebral disc displacement, lumbar region: Secondary | ICD-10-CM | POA: Diagnosis not present

## 2019-03-15 DIAGNOSIS — M47816 Spondylosis without myelopathy or radiculopathy, lumbar region: Secondary | ICD-10-CM | POA: Diagnosis not present

## 2019-03-15 DIAGNOSIS — M5414 Radiculopathy, thoracic region: Secondary | ICD-10-CM | POA: Diagnosis not present

## 2019-03-15 DIAGNOSIS — M5124 Other intervertebral disc displacement, thoracic region: Secondary | ICD-10-CM | POA: Diagnosis not present

## 2019-03-22 DIAGNOSIS — M961 Postlaminectomy syndrome, not elsewhere classified: Secondary | ICD-10-CM | POA: Diagnosis not present

## 2019-05-25 DIAGNOSIS — M4726 Other spondylosis with radiculopathy, lumbar region: Secondary | ICD-10-CM | POA: Diagnosis not present

## 2019-05-25 DIAGNOSIS — M4326 Fusion of spine, lumbar region: Secondary | ICD-10-CM | POA: Diagnosis not present

## 2019-05-30 DIAGNOSIS — E782 Mixed hyperlipidemia: Secondary | ICD-10-CM | POA: Diagnosis not present

## 2019-05-30 DIAGNOSIS — E559 Vitamin D deficiency, unspecified: Secondary | ICD-10-CM | POA: Diagnosis not present

## 2019-05-30 DIAGNOSIS — Z79899 Other long term (current) drug therapy: Secondary | ICD-10-CM | POA: Diagnosis not present

## 2019-05-30 DIAGNOSIS — R739 Hyperglycemia, unspecified: Secondary | ICD-10-CM | POA: Diagnosis not present

## 2019-05-30 DIAGNOSIS — M549 Dorsalgia, unspecified: Secondary | ICD-10-CM | POA: Diagnosis not present

## 2019-06-20 DIAGNOSIS — M5414 Radiculopathy, thoracic region: Secondary | ICD-10-CM | POA: Diagnosis not present

## 2019-06-20 DIAGNOSIS — M533 Sacrococcygeal disorders, not elsewhere classified: Secondary | ICD-10-CM | POA: Diagnosis not present

## 2019-06-20 DIAGNOSIS — M5416 Radiculopathy, lumbar region: Secondary | ICD-10-CM | POA: Diagnosis not present

## 2019-06-20 DIAGNOSIS — M961 Postlaminectomy syndrome, not elsewhere classified: Secondary | ICD-10-CM | POA: Diagnosis not present

## 2019-06-24 DIAGNOSIS — I10 Essential (primary) hypertension: Secondary | ICD-10-CM | POA: Diagnosis not present

## 2019-06-24 DIAGNOSIS — M4726 Other spondylosis with radiculopathy, lumbar region: Secondary | ICD-10-CM | POA: Diagnosis not present

## 2019-06-24 DIAGNOSIS — M4326 Fusion of spine, lumbar region: Secondary | ICD-10-CM | POA: Diagnosis not present

## 2019-06-24 DIAGNOSIS — G894 Chronic pain syndrome: Secondary | ICD-10-CM | POA: Diagnosis not present

## 2019-06-30 DIAGNOSIS — Z01812 Encounter for preprocedural laboratory examination: Secondary | ICD-10-CM | POA: Diagnosis not present

## 2019-06-30 DIAGNOSIS — M961 Postlaminectomy syndrome, not elsewhere classified: Secondary | ICD-10-CM | POA: Diagnosis not present

## 2019-06-30 DIAGNOSIS — I447 Left bundle-branch block, unspecified: Secondary | ICD-10-CM | POA: Diagnosis not present

## 2019-06-30 DIAGNOSIS — Z01810 Encounter for preprocedural cardiovascular examination: Secondary | ICD-10-CM | POA: Diagnosis not present

## 2019-07-04 DIAGNOSIS — G894 Chronic pain syndrome: Secondary | ICD-10-CM | POA: Diagnosis not present

## 2019-07-04 DIAGNOSIS — M4326 Fusion of spine, lumbar region: Secondary | ICD-10-CM | POA: Diagnosis not present

## 2019-07-04 DIAGNOSIS — M961 Postlaminectomy syndrome, not elsewhere classified: Secondary | ICD-10-CM | POA: Diagnosis not present

## 2019-07-04 DIAGNOSIS — Z462 Encounter for fitting and adjustment of other devices related to nervous system and special senses: Secondary | ICD-10-CM | POA: Diagnosis not present

## 2019-07-04 DIAGNOSIS — M4726 Other spondylosis with radiculopathy, lumbar region: Secondary | ICD-10-CM | POA: Diagnosis not present

## 2019-07-04 DIAGNOSIS — Z969 Presence of functional implant, unspecified: Secondary | ICD-10-CM | POA: Diagnosis not present

## 2019-07-18 DIAGNOSIS — Z20822 Contact with and (suspected) exposure to covid-19: Secondary | ICD-10-CM | POA: Diagnosis not present

## 2019-07-18 DIAGNOSIS — M19031 Primary osteoarthritis, right wrist: Secondary | ICD-10-CM | POA: Diagnosis not present

## 2019-07-18 DIAGNOSIS — I10 Essential (primary) hypertension: Secondary | ICD-10-CM | POA: Diagnosis not present

## 2019-07-18 DIAGNOSIS — E785 Hyperlipidemia, unspecified: Secondary | ICD-10-CM | POA: Diagnosis not present

## 2019-07-18 DIAGNOSIS — R509 Fever, unspecified: Secondary | ICD-10-CM | POA: Diagnosis not present

## 2019-07-18 DIAGNOSIS — R000 Tachycardia, unspecified: Secondary | ICD-10-CM | POA: Diagnosis not present

## 2019-07-18 DIAGNOSIS — I480 Paroxysmal atrial fibrillation: Secondary | ICD-10-CM | POA: Diagnosis not present

## 2019-07-18 DIAGNOSIS — Z96653 Presence of artificial knee joint, bilateral: Secondary | ICD-10-CM | POA: Diagnosis not present

## 2019-07-18 DIAGNOSIS — G4733 Obstructive sleep apnea (adult) (pediatric): Secondary | ICD-10-CM | POA: Diagnosis not present

## 2019-07-18 DIAGNOSIS — J180 Bronchopneumonia, unspecified organism: Secondary | ICD-10-CM | POA: Diagnosis not present

## 2019-07-18 DIAGNOSIS — J159 Unspecified bacterial pneumonia: Secondary | ICD-10-CM | POA: Diagnosis not present

## 2019-07-18 DIAGNOSIS — Z87891 Personal history of nicotine dependence: Secondary | ICD-10-CM | POA: Diagnosis not present

## 2019-07-18 DIAGNOSIS — J189 Pneumonia, unspecified organism: Secondary | ICD-10-CM | POA: Diagnosis not present

## 2019-07-18 DIAGNOSIS — J181 Lobar pneumonia, unspecified organism: Secondary | ICD-10-CM | POA: Diagnosis not present

## 2019-07-18 DIAGNOSIS — I2510 Atherosclerotic heart disease of native coronary artery without angina pectoris: Secondary | ICD-10-CM | POA: Diagnosis not present

## 2019-07-18 DIAGNOSIS — J129 Viral pneumonia, unspecified: Secondary | ICD-10-CM | POA: Diagnosis not present

## 2019-07-18 DIAGNOSIS — Z79899 Other long term (current) drug therapy: Secondary | ICD-10-CM | POA: Diagnosis not present

## 2019-07-18 DIAGNOSIS — Z952 Presence of prosthetic heart valve: Secondary | ICD-10-CM | POA: Diagnosis not present

## 2019-07-18 DIAGNOSIS — M25531 Pain in right wrist: Secondary | ICD-10-CM | POA: Diagnosis not present

## 2019-07-18 DIAGNOSIS — Z6841 Body Mass Index (BMI) 40.0 and over, adult: Secondary | ICD-10-CM | POA: Diagnosis not present

## 2019-07-18 DIAGNOSIS — I252 Old myocardial infarction: Secondary | ICD-10-CM | POA: Diagnosis not present

## 2019-07-18 DIAGNOSIS — E119 Type 2 diabetes mellitus without complications: Secondary | ICD-10-CM | POA: Diagnosis not present

## 2019-08-01 DIAGNOSIS — J189 Pneumonia, unspecified organism: Secondary | ICD-10-CM | POA: Diagnosis not present

## 2019-08-01 DIAGNOSIS — M4326 Fusion of spine, lumbar region: Secondary | ICD-10-CM | POA: Diagnosis not present

## 2019-08-01 DIAGNOSIS — Z23 Encounter for immunization: Secondary | ICD-10-CM | POA: Diagnosis not present

## 2019-08-01 DIAGNOSIS — G4730 Sleep apnea, unspecified: Secondary | ICD-10-CM | POA: Diagnosis not present

## 2019-08-01 DIAGNOSIS — Z6841 Body Mass Index (BMI) 40.0 and over, adult: Secondary | ICD-10-CM | POA: Diagnosis not present

## 2019-09-13 DIAGNOSIS — Z79899 Other long term (current) drug therapy: Secondary | ICD-10-CM | POA: Diagnosis not present

## 2019-09-13 DIAGNOSIS — Y990 Civilian activity done for income or pay: Secondary | ICD-10-CM | POA: Diagnosis not present

## 2019-09-13 DIAGNOSIS — M5416 Radiculopathy, lumbar region: Secondary | ICD-10-CM | POA: Diagnosis not present

## 2019-09-13 DIAGNOSIS — M5414 Radiculopathy, thoracic region: Secondary | ICD-10-CM | POA: Diagnosis not present

## 2019-09-13 DIAGNOSIS — Z5181 Encounter for therapeutic drug level monitoring: Secondary | ICD-10-CM | POA: Diagnosis not present

## 2019-09-13 DIAGNOSIS — M961 Postlaminectomy syndrome, not elsewhere classified: Secondary | ICD-10-CM | POA: Diagnosis not present

## 2019-09-27 DIAGNOSIS — M25532 Pain in left wrist: Secondary | ICD-10-CM | POA: Diagnosis not present

## 2019-10-05 DIAGNOSIS — M961 Postlaminectomy syndrome, not elsewhere classified: Secondary | ICD-10-CM | POA: Diagnosis not present

## 2019-10-05 DIAGNOSIS — M4726 Other spondylosis with radiculopathy, lumbar region: Secondary | ICD-10-CM | POA: Diagnosis not present

## 2019-10-05 DIAGNOSIS — Z6841 Body Mass Index (BMI) 40.0 and over, adult: Secondary | ICD-10-CM | POA: Diagnosis not present

## 2019-10-05 DIAGNOSIS — G894 Chronic pain syndrome: Secondary | ICD-10-CM | POA: Diagnosis not present

## 2019-10-18 DIAGNOSIS — G4733 Obstructive sleep apnea (adult) (pediatric): Secondary | ICD-10-CM | POA: Diagnosis not present

## 2019-10-18 DIAGNOSIS — G894 Chronic pain syndrome: Secondary | ICD-10-CM | POA: Diagnosis not present

## 2019-10-18 DIAGNOSIS — I10 Essential (primary) hypertension: Secondary | ICD-10-CM | POA: Diagnosis not present

## 2019-10-18 DIAGNOSIS — E291 Testicular hypofunction: Secondary | ICD-10-CM | POA: Diagnosis not present

## 2019-11-24 DIAGNOSIS — I459 Conduction disorder, unspecified: Secondary | ICD-10-CM | POA: Diagnosis not present

## 2019-11-24 DIAGNOSIS — Z952 Presence of prosthetic heart valve: Secondary | ICD-10-CM | POA: Diagnosis not present

## 2019-11-24 DIAGNOSIS — I480 Paroxysmal atrial fibrillation: Secondary | ICD-10-CM | POA: Diagnosis not present

## 2019-11-24 DIAGNOSIS — I10 Essential (primary) hypertension: Secondary | ICD-10-CM | POA: Diagnosis not present

## 2019-11-24 DIAGNOSIS — I2510 Atherosclerotic heart disease of native coronary artery without angina pectoris: Secondary | ICD-10-CM | POA: Diagnosis not present

## 2019-11-24 DIAGNOSIS — I350 Nonrheumatic aortic (valve) stenosis: Secondary | ICD-10-CM | POA: Diagnosis not present

## 2019-11-30 DIAGNOSIS — R739 Hyperglycemia, unspecified: Secondary | ICD-10-CM | POA: Diagnosis not present

## 2019-11-30 DIAGNOSIS — E782 Mixed hyperlipidemia: Secondary | ICD-10-CM | POA: Diagnosis not present

## 2019-11-30 DIAGNOSIS — E559 Vitamin D deficiency, unspecified: Secondary | ICD-10-CM | POA: Diagnosis not present

## 2019-11-30 DIAGNOSIS — Z79899 Other long term (current) drug therapy: Secondary | ICD-10-CM | POA: Diagnosis not present

## 2019-11-30 DIAGNOSIS — Z23 Encounter for immunization: Secondary | ICD-10-CM | POA: Diagnosis not present

## 2019-11-30 DIAGNOSIS — I10 Essential (primary) hypertension: Secondary | ICD-10-CM | POA: Diagnosis not present

## 2019-12-07 DIAGNOSIS — M5442 Lumbago with sciatica, left side: Secondary | ICD-10-CM | POA: Diagnosis not present

## 2019-12-07 DIAGNOSIS — M961 Postlaminectomy syndrome, not elsewhere classified: Secondary | ICD-10-CM | POA: Diagnosis not present

## 2019-12-07 DIAGNOSIS — Y990 Civilian activity done for income or pay: Secondary | ICD-10-CM | POA: Diagnosis not present

## 2019-12-07 DIAGNOSIS — G894 Chronic pain syndrome: Secondary | ICD-10-CM | POA: Diagnosis not present

## 2019-12-23 DIAGNOSIS — G4733 Obstructive sleep apnea (adult) (pediatric): Secondary | ICD-10-CM | POA: Diagnosis not present

## 2020-01-16 DIAGNOSIS — R972 Elevated prostate specific antigen [PSA]: Secondary | ICD-10-CM | POA: Diagnosis not present

## 2020-02-08 DIAGNOSIS — R6889 Other general symptoms and signs: Secondary | ICD-10-CM | POA: Diagnosis not present

## 2020-02-08 DIAGNOSIS — R972 Elevated prostate specific antigen [PSA]: Secondary | ICD-10-CM | POA: Diagnosis not present

## 2020-03-06 DIAGNOSIS — M533 Sacrococcygeal disorders, not elsewhere classified: Secondary | ICD-10-CM | POA: Diagnosis not present

## 2020-03-06 DIAGNOSIS — Z79899 Other long term (current) drug therapy: Secondary | ICD-10-CM | POA: Diagnosis not present

## 2020-03-06 DIAGNOSIS — M5442 Lumbago with sciatica, left side: Secondary | ICD-10-CM | POA: Diagnosis not present

## 2020-03-06 DIAGNOSIS — M961 Postlaminectomy syndrome, not elsewhere classified: Secondary | ICD-10-CM | POA: Diagnosis not present

## 2020-03-06 DIAGNOSIS — Z5181 Encounter for therapeutic drug level monitoring: Secondary | ICD-10-CM | POA: Diagnosis not present

## 2020-03-06 DIAGNOSIS — Y990 Civilian activity done for income or pay: Secondary | ICD-10-CM | POA: Diagnosis not present

## 2020-03-08 DIAGNOSIS — C61 Malignant neoplasm of prostate: Secondary | ICD-10-CM | POA: Diagnosis not present

## 2020-03-08 DIAGNOSIS — R972 Elevated prostate specific antigen [PSA]: Secondary | ICD-10-CM | POA: Diagnosis not present

## 2020-03-19 DIAGNOSIS — R972 Elevated prostate specific antigen [PSA]: Secondary | ICD-10-CM | POA: Diagnosis not present

## 2020-03-19 DIAGNOSIS — C61 Malignant neoplasm of prostate: Secondary | ICD-10-CM | POA: Diagnosis not present

## 2020-03-28 DIAGNOSIS — M19012 Primary osteoarthritis, left shoulder: Secondary | ICD-10-CM | POA: Diagnosis not present

## 2020-03-28 DIAGNOSIS — C61 Malignant neoplasm of prostate: Secondary | ICD-10-CM | POA: Diagnosis not present

## 2020-03-28 DIAGNOSIS — M19011 Primary osteoarthritis, right shoulder: Secondary | ICD-10-CM | POA: Diagnosis not present

## 2020-03-28 DIAGNOSIS — M19071 Primary osteoarthritis, right ankle and foot: Secondary | ICD-10-CM | POA: Diagnosis not present

## 2020-04-18 DIAGNOSIS — C61 Malignant neoplasm of prostate: Secondary | ICD-10-CM | POA: Diagnosis not present

## 2020-05-16 DIAGNOSIS — C61 Malignant neoplasm of prostate: Secondary | ICD-10-CM | POA: Diagnosis not present

## 2020-05-23 DIAGNOSIS — C61 Malignant neoplasm of prostate: Secondary | ICD-10-CM | POA: Diagnosis not present

## 2020-05-23 DIAGNOSIS — Z13820 Encounter for screening for osteoporosis: Secondary | ICD-10-CM | POA: Diagnosis not present

## 2020-05-29 DIAGNOSIS — G894 Chronic pain syndrome: Secondary | ICD-10-CM | POA: Diagnosis not present

## 2020-05-29 DIAGNOSIS — M79671 Pain in right foot: Secondary | ICD-10-CM | POA: Diagnosis not present

## 2020-05-29 DIAGNOSIS — M961 Postlaminectomy syndrome, not elsewhere classified: Secondary | ICD-10-CM | POA: Diagnosis not present

## 2020-05-29 DIAGNOSIS — G893 Neoplasm related pain (acute) (chronic): Secondary | ICD-10-CM | POA: Diagnosis not present

## 2020-05-30 DIAGNOSIS — C61 Malignant neoplasm of prostate: Secondary | ICD-10-CM | POA: Diagnosis not present

## 2020-05-30 DIAGNOSIS — F411 Generalized anxiety disorder: Secondary | ICD-10-CM | POA: Diagnosis not present

## 2020-05-30 DIAGNOSIS — E782 Mixed hyperlipidemia: Secondary | ICD-10-CM | POA: Diagnosis not present

## 2020-05-30 DIAGNOSIS — R739 Hyperglycemia, unspecified: Secondary | ICD-10-CM | POA: Diagnosis not present

## 2020-05-30 DIAGNOSIS — Z79899 Other long term (current) drug therapy: Secondary | ICD-10-CM | POA: Diagnosis not present

## 2020-05-30 DIAGNOSIS — E559 Vitamin D deficiency, unspecified: Secondary | ICD-10-CM | POA: Diagnosis not present

## 2020-06-01 DIAGNOSIS — C61 Malignant neoplasm of prostate: Secondary | ICD-10-CM | POA: Diagnosis not present

## 2020-06-11 DIAGNOSIS — C61 Malignant neoplasm of prostate: Secondary | ICD-10-CM | POA: Diagnosis not present

## 2020-06-13 DIAGNOSIS — C61 Malignant neoplasm of prostate: Secondary | ICD-10-CM | POA: Diagnosis not present

## 2020-06-20 DIAGNOSIS — Z510 Encounter for antineoplastic radiation therapy: Secondary | ICD-10-CM | POA: Diagnosis not present

## 2020-06-20 DIAGNOSIS — R140 Abdominal distension (gaseous): Secondary | ICD-10-CM | POA: Diagnosis not present

## 2020-06-20 DIAGNOSIS — J069 Acute upper respiratory infection, unspecified: Secondary | ICD-10-CM | POA: Diagnosis not present

## 2020-06-20 DIAGNOSIS — Z20822 Contact with and (suspected) exposure to covid-19: Secondary | ICD-10-CM | POA: Diagnosis not present

## 2020-06-20 DIAGNOSIS — R059 Cough, unspecified: Secondary | ICD-10-CM | POA: Diagnosis not present

## 2020-06-20 DIAGNOSIS — R5383 Other fatigue: Secondary | ICD-10-CM | POA: Diagnosis not present

## 2020-06-20 DIAGNOSIS — R232 Flushing: Secondary | ICD-10-CM | POA: Diagnosis not present

## 2020-06-20 DIAGNOSIS — Z79818 Long term (current) use of other agents affecting estrogen receptors and estrogen levels: Secondary | ICD-10-CM | POA: Diagnosis not present

## 2020-06-20 DIAGNOSIS — C61 Malignant neoplasm of prostate: Secondary | ICD-10-CM | POA: Diagnosis not present

## 2020-06-22 DIAGNOSIS — J069 Acute upper respiratory infection, unspecified: Secondary | ICD-10-CM | POA: Diagnosis not present

## 2020-06-25 DIAGNOSIS — R140 Abdominal distension (gaseous): Secondary | ICD-10-CM | POA: Diagnosis not present

## 2020-06-25 DIAGNOSIS — C61 Malignant neoplasm of prostate: Secondary | ICD-10-CM | POA: Diagnosis not present

## 2020-06-25 DIAGNOSIS — Z79818 Long term (current) use of other agents affecting estrogen receptors and estrogen levels: Secondary | ICD-10-CM | POA: Diagnosis not present

## 2020-06-25 DIAGNOSIS — Z510 Encounter for antineoplastic radiation therapy: Secondary | ICD-10-CM | POA: Diagnosis not present

## 2020-06-25 DIAGNOSIS — R5383 Other fatigue: Secondary | ICD-10-CM | POA: Diagnosis not present

## 2020-06-25 DIAGNOSIS — R232 Flushing: Secondary | ICD-10-CM | POA: Diagnosis not present

## 2020-06-26 DIAGNOSIS — R5383 Other fatigue: Secondary | ICD-10-CM | POA: Diagnosis not present

## 2020-06-26 DIAGNOSIS — R232 Flushing: Secondary | ICD-10-CM | POA: Diagnosis not present

## 2020-06-26 DIAGNOSIS — Z510 Encounter for antineoplastic radiation therapy: Secondary | ICD-10-CM | POA: Diagnosis not present

## 2020-06-26 DIAGNOSIS — Z79818 Long term (current) use of other agents affecting estrogen receptors and estrogen levels: Secondary | ICD-10-CM | POA: Diagnosis not present

## 2020-06-26 DIAGNOSIS — R140 Abdominal distension (gaseous): Secondary | ICD-10-CM | POA: Diagnosis not present

## 2020-06-26 DIAGNOSIS — C61 Malignant neoplasm of prostate: Secondary | ICD-10-CM | POA: Diagnosis not present

## 2020-06-27 DIAGNOSIS — C61 Malignant neoplasm of prostate: Secondary | ICD-10-CM | POA: Diagnosis not present

## 2020-06-27 DIAGNOSIS — R232 Flushing: Secondary | ICD-10-CM | POA: Diagnosis not present

## 2020-06-27 DIAGNOSIS — R5383 Other fatigue: Secondary | ICD-10-CM | POA: Diagnosis not present

## 2020-06-27 DIAGNOSIS — R140 Abdominal distension (gaseous): Secondary | ICD-10-CM | POA: Diagnosis not present

## 2020-06-27 DIAGNOSIS — Z79818 Long term (current) use of other agents affecting estrogen receptors and estrogen levels: Secondary | ICD-10-CM | POA: Diagnosis not present

## 2020-06-27 DIAGNOSIS — Z510 Encounter for antineoplastic radiation therapy: Secondary | ICD-10-CM | POA: Diagnosis not present

## 2020-06-28 DIAGNOSIS — Z79818 Long term (current) use of other agents affecting estrogen receptors and estrogen levels: Secondary | ICD-10-CM | POA: Diagnosis not present

## 2020-06-28 DIAGNOSIS — R232 Flushing: Secondary | ICD-10-CM | POA: Diagnosis not present

## 2020-06-28 DIAGNOSIS — Z510 Encounter for antineoplastic radiation therapy: Secondary | ICD-10-CM | POA: Diagnosis not present

## 2020-06-28 DIAGNOSIS — R140 Abdominal distension (gaseous): Secondary | ICD-10-CM | POA: Diagnosis not present

## 2020-06-28 DIAGNOSIS — C61 Malignant neoplasm of prostate: Secondary | ICD-10-CM | POA: Diagnosis not present

## 2020-06-28 DIAGNOSIS — R5383 Other fatigue: Secondary | ICD-10-CM | POA: Diagnosis not present

## 2020-06-29 DIAGNOSIS — R140 Abdominal distension (gaseous): Secondary | ICD-10-CM | POA: Diagnosis not present

## 2020-06-29 DIAGNOSIS — Z510 Encounter for antineoplastic radiation therapy: Secondary | ICD-10-CM | POA: Diagnosis not present

## 2020-06-29 DIAGNOSIS — C61 Malignant neoplasm of prostate: Secondary | ICD-10-CM | POA: Diagnosis not present

## 2020-06-29 DIAGNOSIS — R5383 Other fatigue: Secondary | ICD-10-CM | POA: Diagnosis not present

## 2020-06-29 DIAGNOSIS — Z79818 Long term (current) use of other agents affecting estrogen receptors and estrogen levels: Secondary | ICD-10-CM | POA: Diagnosis not present

## 2020-06-29 DIAGNOSIS — R232 Flushing: Secondary | ICD-10-CM | POA: Diagnosis not present

## 2020-07-02 DIAGNOSIS — R140 Abdominal distension (gaseous): Secondary | ICD-10-CM | POA: Diagnosis not present

## 2020-07-02 DIAGNOSIS — Z510 Encounter for antineoplastic radiation therapy: Secondary | ICD-10-CM | POA: Diagnosis not present

## 2020-07-02 DIAGNOSIS — R5383 Other fatigue: Secondary | ICD-10-CM | POA: Diagnosis not present

## 2020-07-02 DIAGNOSIS — Z79818 Long term (current) use of other agents affecting estrogen receptors and estrogen levels: Secondary | ICD-10-CM | POA: Diagnosis not present

## 2020-07-02 DIAGNOSIS — R232 Flushing: Secondary | ICD-10-CM | POA: Diagnosis not present

## 2020-07-02 DIAGNOSIS — C61 Malignant neoplasm of prostate: Secondary | ICD-10-CM | POA: Diagnosis not present

## 2020-07-03 DIAGNOSIS — R140 Abdominal distension (gaseous): Secondary | ICD-10-CM | POA: Diagnosis not present

## 2020-07-03 DIAGNOSIS — R5383 Other fatigue: Secondary | ICD-10-CM | POA: Diagnosis not present

## 2020-07-03 DIAGNOSIS — R232 Flushing: Secondary | ICD-10-CM | POA: Diagnosis not present

## 2020-07-03 DIAGNOSIS — Z510 Encounter for antineoplastic radiation therapy: Secondary | ICD-10-CM | POA: Diagnosis not present

## 2020-07-03 DIAGNOSIS — Z79818 Long term (current) use of other agents affecting estrogen receptors and estrogen levels: Secondary | ICD-10-CM | POA: Diagnosis not present

## 2020-07-03 DIAGNOSIS — C61 Malignant neoplasm of prostate: Secondary | ICD-10-CM | POA: Diagnosis not present

## 2020-07-04 DIAGNOSIS — R232 Flushing: Secondary | ICD-10-CM | POA: Diagnosis not present

## 2020-07-04 DIAGNOSIS — Z79818 Long term (current) use of other agents affecting estrogen receptors and estrogen levels: Secondary | ICD-10-CM | POA: Diagnosis not present

## 2020-07-04 DIAGNOSIS — R140 Abdominal distension (gaseous): Secondary | ICD-10-CM | POA: Diagnosis not present

## 2020-07-04 DIAGNOSIS — R5383 Other fatigue: Secondary | ICD-10-CM | POA: Diagnosis not present

## 2020-07-04 DIAGNOSIS — C61 Malignant neoplasm of prostate: Secondary | ICD-10-CM | POA: Diagnosis not present

## 2020-07-04 DIAGNOSIS — Z510 Encounter for antineoplastic radiation therapy: Secondary | ICD-10-CM | POA: Diagnosis not present

## 2020-07-05 DIAGNOSIS — R140 Abdominal distension (gaseous): Secondary | ICD-10-CM | POA: Diagnosis not present

## 2020-07-05 DIAGNOSIS — R232 Flushing: Secondary | ICD-10-CM | POA: Diagnosis not present

## 2020-07-05 DIAGNOSIS — R5383 Other fatigue: Secondary | ICD-10-CM | POA: Diagnosis not present

## 2020-07-05 DIAGNOSIS — C61 Malignant neoplasm of prostate: Secondary | ICD-10-CM | POA: Diagnosis not present

## 2020-07-05 DIAGNOSIS — Z79818 Long term (current) use of other agents affecting estrogen receptors and estrogen levels: Secondary | ICD-10-CM | POA: Diagnosis not present

## 2020-07-05 DIAGNOSIS — Z510 Encounter for antineoplastic radiation therapy: Secondary | ICD-10-CM | POA: Diagnosis not present

## 2020-07-06 DIAGNOSIS — R140 Abdominal distension (gaseous): Secondary | ICD-10-CM | POA: Diagnosis not present

## 2020-07-06 DIAGNOSIS — G894 Chronic pain syndrome: Secondary | ICD-10-CM | POA: Diagnosis not present

## 2020-07-06 DIAGNOSIS — R52 Pain, unspecified: Secondary | ICD-10-CM | POA: Diagnosis not present

## 2020-07-06 DIAGNOSIS — R5383 Other fatigue: Secondary | ICD-10-CM | POA: Diagnosis not present

## 2020-07-06 DIAGNOSIS — C61 Malignant neoplasm of prostate: Secondary | ICD-10-CM | POA: Diagnosis not present

## 2020-07-06 DIAGNOSIS — M2550 Pain in unspecified joint: Secondary | ICD-10-CM | POA: Diagnosis not present

## 2020-07-06 DIAGNOSIS — R232 Flushing: Secondary | ICD-10-CM | POA: Diagnosis not present

## 2020-07-06 DIAGNOSIS — Z79818 Long term (current) use of other agents affecting estrogen receptors and estrogen levels: Secondary | ICD-10-CM | POA: Diagnosis not present

## 2020-07-06 DIAGNOSIS — G893 Neoplasm related pain (acute) (chronic): Secondary | ICD-10-CM | POA: Diagnosis not present

## 2020-07-06 DIAGNOSIS — Z510 Encounter for antineoplastic radiation therapy: Secondary | ICD-10-CM | POA: Diagnosis not present

## 2020-07-10 DIAGNOSIS — C61 Malignant neoplasm of prostate: Secondary | ICD-10-CM | POA: Diagnosis not present

## 2020-07-10 DIAGNOSIS — Z79818 Long term (current) use of other agents affecting estrogen receptors and estrogen levels: Secondary | ICD-10-CM | POA: Diagnosis not present

## 2020-07-10 DIAGNOSIS — R232 Flushing: Secondary | ICD-10-CM | POA: Diagnosis not present

## 2020-07-10 DIAGNOSIS — R5383 Other fatigue: Secondary | ICD-10-CM | POA: Diagnosis not present

## 2020-07-10 DIAGNOSIS — R140 Abdominal distension (gaseous): Secondary | ICD-10-CM | POA: Diagnosis not present

## 2020-07-10 DIAGNOSIS — Z510 Encounter for antineoplastic radiation therapy: Secondary | ICD-10-CM | POA: Diagnosis not present

## 2020-07-11 DIAGNOSIS — Z79818 Long term (current) use of other agents affecting estrogen receptors and estrogen levels: Secondary | ICD-10-CM | POA: Diagnosis not present

## 2020-07-11 DIAGNOSIS — R5383 Other fatigue: Secondary | ICD-10-CM | POA: Diagnosis not present

## 2020-07-11 DIAGNOSIS — C61 Malignant neoplasm of prostate: Secondary | ICD-10-CM | POA: Diagnosis not present

## 2020-07-11 DIAGNOSIS — R232 Flushing: Secondary | ICD-10-CM | POA: Diagnosis not present

## 2020-07-11 DIAGNOSIS — R140 Abdominal distension (gaseous): Secondary | ICD-10-CM | POA: Diagnosis not present

## 2020-07-11 DIAGNOSIS — Z510 Encounter for antineoplastic radiation therapy: Secondary | ICD-10-CM | POA: Diagnosis not present

## 2020-07-12 DIAGNOSIS — Z79818 Long term (current) use of other agents affecting estrogen receptors and estrogen levels: Secondary | ICD-10-CM | POA: Diagnosis not present

## 2020-07-12 DIAGNOSIS — R232 Flushing: Secondary | ICD-10-CM | POA: Diagnosis not present

## 2020-07-12 DIAGNOSIS — R5383 Other fatigue: Secondary | ICD-10-CM | POA: Diagnosis not present

## 2020-07-12 DIAGNOSIS — R140 Abdominal distension (gaseous): Secondary | ICD-10-CM | POA: Diagnosis not present

## 2020-07-12 DIAGNOSIS — C61 Malignant neoplasm of prostate: Secondary | ICD-10-CM | POA: Diagnosis not present

## 2020-07-12 DIAGNOSIS — Z510 Encounter for antineoplastic radiation therapy: Secondary | ICD-10-CM | POA: Diagnosis not present

## 2020-07-13 DIAGNOSIS — R140 Abdominal distension (gaseous): Secondary | ICD-10-CM | POA: Diagnosis not present

## 2020-07-13 DIAGNOSIS — R232 Flushing: Secondary | ICD-10-CM | POA: Diagnosis not present

## 2020-07-13 DIAGNOSIS — Z510 Encounter for antineoplastic radiation therapy: Secondary | ICD-10-CM | POA: Diagnosis not present

## 2020-07-13 DIAGNOSIS — R5383 Other fatigue: Secondary | ICD-10-CM | POA: Diagnosis not present

## 2020-07-13 DIAGNOSIS — C61 Malignant neoplasm of prostate: Secondary | ICD-10-CM | POA: Diagnosis not present

## 2020-07-13 DIAGNOSIS — Z79818 Long term (current) use of other agents affecting estrogen receptors and estrogen levels: Secondary | ICD-10-CM | POA: Diagnosis not present

## 2020-07-16 DIAGNOSIS — R140 Abdominal distension (gaseous): Secondary | ICD-10-CM | POA: Diagnosis not present

## 2020-07-16 DIAGNOSIS — R232 Flushing: Secondary | ICD-10-CM | POA: Diagnosis not present

## 2020-07-16 DIAGNOSIS — Z79818 Long term (current) use of other agents affecting estrogen receptors and estrogen levels: Secondary | ICD-10-CM | POA: Diagnosis not present

## 2020-07-16 DIAGNOSIS — R5383 Other fatigue: Secondary | ICD-10-CM | POA: Diagnosis not present

## 2020-07-16 DIAGNOSIS — C61 Malignant neoplasm of prostate: Secondary | ICD-10-CM | POA: Diagnosis not present

## 2020-07-16 DIAGNOSIS — Z510 Encounter for antineoplastic radiation therapy: Secondary | ICD-10-CM | POA: Diagnosis not present

## 2020-07-17 DIAGNOSIS — R5383 Other fatigue: Secondary | ICD-10-CM | POA: Diagnosis not present

## 2020-07-17 DIAGNOSIS — R140 Abdominal distension (gaseous): Secondary | ICD-10-CM | POA: Diagnosis not present

## 2020-07-17 DIAGNOSIS — Z510 Encounter for antineoplastic radiation therapy: Secondary | ICD-10-CM | POA: Diagnosis not present

## 2020-07-17 DIAGNOSIS — C61 Malignant neoplasm of prostate: Secondary | ICD-10-CM | POA: Diagnosis not present

## 2020-07-17 DIAGNOSIS — R232 Flushing: Secondary | ICD-10-CM | POA: Diagnosis not present

## 2020-07-17 DIAGNOSIS — Z79818 Long term (current) use of other agents affecting estrogen receptors and estrogen levels: Secondary | ICD-10-CM | POA: Diagnosis not present

## 2020-07-18 DIAGNOSIS — Z510 Encounter for antineoplastic radiation therapy: Secondary | ICD-10-CM | POA: Diagnosis not present

## 2020-07-18 DIAGNOSIS — R232 Flushing: Secondary | ICD-10-CM | POA: Diagnosis not present

## 2020-07-18 DIAGNOSIS — C61 Malignant neoplasm of prostate: Secondary | ICD-10-CM | POA: Diagnosis not present

## 2020-07-18 DIAGNOSIS — R140 Abdominal distension (gaseous): Secondary | ICD-10-CM | POA: Diagnosis not present

## 2020-07-18 DIAGNOSIS — Z79818 Long term (current) use of other agents affecting estrogen receptors and estrogen levels: Secondary | ICD-10-CM | POA: Diagnosis not present

## 2020-07-18 DIAGNOSIS — R5383 Other fatigue: Secondary | ICD-10-CM | POA: Diagnosis not present

## 2020-07-19 DIAGNOSIS — R5383 Other fatigue: Secondary | ICD-10-CM | POA: Diagnosis not present

## 2020-07-19 DIAGNOSIS — C61 Malignant neoplasm of prostate: Secondary | ICD-10-CM | POA: Diagnosis not present

## 2020-07-19 DIAGNOSIS — R232 Flushing: Secondary | ICD-10-CM | POA: Diagnosis not present

## 2020-07-19 DIAGNOSIS — R140 Abdominal distension (gaseous): Secondary | ICD-10-CM | POA: Diagnosis not present

## 2020-07-19 DIAGNOSIS — Z510 Encounter for antineoplastic radiation therapy: Secondary | ICD-10-CM | POA: Diagnosis not present

## 2020-07-19 DIAGNOSIS — Z79818 Long term (current) use of other agents affecting estrogen receptors and estrogen levels: Secondary | ICD-10-CM | POA: Diagnosis not present

## 2020-07-20 DIAGNOSIS — Z79818 Long term (current) use of other agents affecting estrogen receptors and estrogen levels: Secondary | ICD-10-CM | POA: Diagnosis not present

## 2020-07-20 DIAGNOSIS — R140 Abdominal distension (gaseous): Secondary | ICD-10-CM | POA: Diagnosis not present

## 2020-07-20 DIAGNOSIS — C61 Malignant neoplasm of prostate: Secondary | ICD-10-CM | POA: Diagnosis not present

## 2020-07-20 DIAGNOSIS — R5383 Other fatigue: Secondary | ICD-10-CM | POA: Diagnosis not present

## 2020-07-20 DIAGNOSIS — Z510 Encounter for antineoplastic radiation therapy: Secondary | ICD-10-CM | POA: Diagnosis not present

## 2020-07-20 DIAGNOSIS — R232 Flushing: Secondary | ICD-10-CM | POA: Diagnosis not present

## 2020-07-23 DIAGNOSIS — Z79818 Long term (current) use of other agents affecting estrogen receptors and estrogen levels: Secondary | ICD-10-CM | POA: Diagnosis not present

## 2020-07-23 DIAGNOSIS — C61 Malignant neoplasm of prostate: Secondary | ICD-10-CM | POA: Diagnosis not present

## 2020-07-23 DIAGNOSIS — Z510 Encounter for antineoplastic radiation therapy: Secondary | ICD-10-CM | POA: Diagnosis not present

## 2020-07-23 DIAGNOSIS — R140 Abdominal distension (gaseous): Secondary | ICD-10-CM | POA: Diagnosis not present

## 2020-07-23 DIAGNOSIS — R232 Flushing: Secondary | ICD-10-CM | POA: Diagnosis not present

## 2020-07-23 DIAGNOSIS — R5383 Other fatigue: Secondary | ICD-10-CM | POA: Diagnosis not present

## 2020-07-24 DIAGNOSIS — Z79818 Long term (current) use of other agents affecting estrogen receptors and estrogen levels: Secondary | ICD-10-CM | POA: Diagnosis not present

## 2020-07-24 DIAGNOSIS — R232 Flushing: Secondary | ICD-10-CM | POA: Diagnosis not present

## 2020-07-24 DIAGNOSIS — Z510 Encounter for antineoplastic radiation therapy: Secondary | ICD-10-CM | POA: Diagnosis not present

## 2020-07-24 DIAGNOSIS — R5383 Other fatigue: Secondary | ICD-10-CM | POA: Diagnosis not present

## 2020-07-24 DIAGNOSIS — R140 Abdominal distension (gaseous): Secondary | ICD-10-CM | POA: Diagnosis not present

## 2020-07-24 DIAGNOSIS — C61 Malignant neoplasm of prostate: Secondary | ICD-10-CM | POA: Diagnosis not present

## 2020-07-25 DIAGNOSIS — Z79818 Long term (current) use of other agents affecting estrogen receptors and estrogen levels: Secondary | ICD-10-CM | POA: Diagnosis not present

## 2020-07-25 DIAGNOSIS — C61 Malignant neoplasm of prostate: Secondary | ICD-10-CM | POA: Diagnosis not present

## 2020-07-25 DIAGNOSIS — R140 Abdominal distension (gaseous): Secondary | ICD-10-CM | POA: Diagnosis not present

## 2020-07-25 DIAGNOSIS — R5383 Other fatigue: Secondary | ICD-10-CM | POA: Diagnosis not present

## 2020-07-25 DIAGNOSIS — R232 Flushing: Secondary | ICD-10-CM | POA: Diagnosis not present

## 2020-07-25 DIAGNOSIS — Z510 Encounter for antineoplastic radiation therapy: Secondary | ICD-10-CM | POA: Diagnosis not present

## 2020-07-26 DIAGNOSIS — R5383 Other fatigue: Secondary | ICD-10-CM | POA: Diagnosis not present

## 2020-07-26 DIAGNOSIS — Z510 Encounter for antineoplastic radiation therapy: Secondary | ICD-10-CM | POA: Diagnosis not present

## 2020-07-26 DIAGNOSIS — R232 Flushing: Secondary | ICD-10-CM | POA: Diagnosis not present

## 2020-07-26 DIAGNOSIS — C61 Malignant neoplasm of prostate: Secondary | ICD-10-CM | POA: Diagnosis not present

## 2020-07-26 DIAGNOSIS — R140 Abdominal distension (gaseous): Secondary | ICD-10-CM | POA: Diagnosis not present

## 2020-07-26 DIAGNOSIS — Z79818 Long term (current) use of other agents affecting estrogen receptors and estrogen levels: Secondary | ICD-10-CM | POA: Diagnosis not present

## 2020-07-27 DIAGNOSIS — Z510 Encounter for antineoplastic radiation therapy: Secondary | ICD-10-CM | POA: Diagnosis not present

## 2020-07-27 DIAGNOSIS — R5383 Other fatigue: Secondary | ICD-10-CM | POA: Diagnosis not present

## 2020-07-27 DIAGNOSIS — C61 Malignant neoplasm of prostate: Secondary | ICD-10-CM | POA: Diagnosis not present

## 2020-07-27 DIAGNOSIS — R140 Abdominal distension (gaseous): Secondary | ICD-10-CM | POA: Diagnosis not present

## 2020-07-27 DIAGNOSIS — R232 Flushing: Secondary | ICD-10-CM | POA: Diagnosis not present

## 2020-07-27 DIAGNOSIS — Z79818 Long term (current) use of other agents affecting estrogen receptors and estrogen levels: Secondary | ICD-10-CM | POA: Diagnosis not present

## 2020-07-30 DIAGNOSIS — R140 Abdominal distension (gaseous): Secondary | ICD-10-CM | POA: Diagnosis not present

## 2020-07-30 DIAGNOSIS — Z510 Encounter for antineoplastic radiation therapy: Secondary | ICD-10-CM | POA: Diagnosis not present

## 2020-07-30 DIAGNOSIS — R5383 Other fatigue: Secondary | ICD-10-CM | POA: Diagnosis not present

## 2020-07-30 DIAGNOSIS — R232 Flushing: Secondary | ICD-10-CM | POA: Diagnosis not present

## 2020-07-30 DIAGNOSIS — Z79818 Long term (current) use of other agents affecting estrogen receptors and estrogen levels: Secondary | ICD-10-CM | POA: Diagnosis not present

## 2020-07-30 DIAGNOSIS — C61 Malignant neoplasm of prostate: Secondary | ICD-10-CM | POA: Diagnosis not present

## 2020-07-31 DIAGNOSIS — Z79818 Long term (current) use of other agents affecting estrogen receptors and estrogen levels: Secondary | ICD-10-CM | POA: Diagnosis not present

## 2020-07-31 DIAGNOSIS — R5383 Other fatigue: Secondary | ICD-10-CM | POA: Diagnosis not present

## 2020-07-31 DIAGNOSIS — R140 Abdominal distension (gaseous): Secondary | ICD-10-CM | POA: Diagnosis not present

## 2020-07-31 DIAGNOSIS — R232 Flushing: Secondary | ICD-10-CM | POA: Diagnosis not present

## 2020-07-31 DIAGNOSIS — Z510 Encounter for antineoplastic radiation therapy: Secondary | ICD-10-CM | POA: Diagnosis not present

## 2020-07-31 DIAGNOSIS — C61 Malignant neoplasm of prostate: Secondary | ICD-10-CM | POA: Diagnosis not present

## 2020-08-01 DIAGNOSIS — R140 Abdominal distension (gaseous): Secondary | ICD-10-CM | POA: Diagnosis not present

## 2020-08-01 DIAGNOSIS — Z510 Encounter for antineoplastic radiation therapy: Secondary | ICD-10-CM | POA: Diagnosis not present

## 2020-08-01 DIAGNOSIS — R232 Flushing: Secondary | ICD-10-CM | POA: Diagnosis not present

## 2020-08-01 DIAGNOSIS — C61 Malignant neoplasm of prostate: Secondary | ICD-10-CM | POA: Diagnosis not present

## 2020-08-01 DIAGNOSIS — R5383 Other fatigue: Secondary | ICD-10-CM | POA: Diagnosis not present

## 2020-08-01 DIAGNOSIS — Z79818 Long term (current) use of other agents affecting estrogen receptors and estrogen levels: Secondary | ICD-10-CM | POA: Diagnosis not present

## 2020-08-02 DIAGNOSIS — R232 Flushing: Secondary | ICD-10-CM | POA: Diagnosis not present

## 2020-08-02 DIAGNOSIS — C61 Malignant neoplasm of prostate: Secondary | ICD-10-CM | POA: Diagnosis not present

## 2020-08-02 DIAGNOSIS — R5383 Other fatigue: Secondary | ICD-10-CM | POA: Diagnosis not present

## 2020-08-02 DIAGNOSIS — Z79818 Long term (current) use of other agents affecting estrogen receptors and estrogen levels: Secondary | ICD-10-CM | POA: Diagnosis not present

## 2020-08-02 DIAGNOSIS — Z510 Encounter for antineoplastic radiation therapy: Secondary | ICD-10-CM | POA: Diagnosis not present

## 2020-08-02 DIAGNOSIS — R140 Abdominal distension (gaseous): Secondary | ICD-10-CM | POA: Diagnosis not present

## 2020-08-23 DIAGNOSIS — G894 Chronic pain syndrome: Secondary | ICD-10-CM | POA: Diagnosis not present

## 2020-08-23 DIAGNOSIS — M2550 Pain in unspecified joint: Secondary | ICD-10-CM | POA: Diagnosis not present

## 2020-08-23 DIAGNOSIS — R52 Pain, unspecified: Secondary | ICD-10-CM | POA: Diagnosis not present

## 2020-08-23 DIAGNOSIS — G893 Neoplasm related pain (acute) (chronic): Secondary | ICD-10-CM | POA: Diagnosis not present

## 2020-10-17 DIAGNOSIS — I10 Essential (primary) hypertension: Secondary | ICD-10-CM | POA: Diagnosis not present

## 2020-10-17 DIAGNOSIS — G4733 Obstructive sleep apnea (adult) (pediatric): Secondary | ICD-10-CM | POA: Diagnosis not present

## 2020-10-17 DIAGNOSIS — M159 Polyosteoarthritis, unspecified: Secondary | ICD-10-CM | POA: Diagnosis not present

## 2020-10-17 DIAGNOSIS — I272 Pulmonary hypertension, unspecified: Secondary | ICD-10-CM | POA: Diagnosis not present

## 2020-11-12 DIAGNOSIS — C61 Malignant neoplasm of prostate: Secondary | ICD-10-CM | POA: Diagnosis not present

## 2020-11-19 DIAGNOSIS — R972 Elevated prostate specific antigen [PSA]: Secondary | ICD-10-CM | POA: Diagnosis not present

## 2020-11-19 DIAGNOSIS — C61 Malignant neoplasm of prostate: Secondary | ICD-10-CM | POA: Diagnosis not present

## 2020-11-20 DIAGNOSIS — Z9889 Other specified postprocedural states: Secondary | ICD-10-CM | POA: Diagnosis not present

## 2020-11-20 DIAGNOSIS — Z5181 Encounter for therapeutic drug level monitoring: Secondary | ICD-10-CM | POA: Diagnosis not present

## 2020-11-20 DIAGNOSIS — M549 Dorsalgia, unspecified: Secondary | ICD-10-CM | POA: Diagnosis not present

## 2020-11-20 DIAGNOSIS — Y990 Civilian activity done for income or pay: Secondary | ICD-10-CM | POA: Diagnosis not present

## 2020-11-20 DIAGNOSIS — Z79899 Other long term (current) drug therapy: Secondary | ICD-10-CM | POA: Diagnosis not present

## 2020-11-20 DIAGNOSIS — M5417 Radiculopathy, lumbosacral region: Secondary | ICD-10-CM | POA: Diagnosis not present

## 2020-11-26 DIAGNOSIS — Z79899 Other long term (current) drug therapy: Secondary | ICD-10-CM | POA: Diagnosis not present

## 2020-11-26 DIAGNOSIS — E559 Vitamin D deficiency, unspecified: Secondary | ICD-10-CM | POA: Diagnosis not present

## 2020-11-26 DIAGNOSIS — I10 Essential (primary) hypertension: Secondary | ICD-10-CM | POA: Diagnosis not present

## 2020-11-26 DIAGNOSIS — R739 Hyperglycemia, unspecified: Secondary | ICD-10-CM | POA: Diagnosis not present

## 2020-11-26 DIAGNOSIS — Z23 Encounter for immunization: Secondary | ICD-10-CM | POA: Diagnosis not present

## 2020-11-26 DIAGNOSIS — Z6841 Body Mass Index (BMI) 40.0 and over, adult: Secondary | ICD-10-CM | POA: Diagnosis not present

## 2020-11-26 DIAGNOSIS — E782 Mixed hyperlipidemia: Secondary | ICD-10-CM | POA: Diagnosis not present

## 2020-12-17 DIAGNOSIS — I350 Nonrheumatic aortic (valve) stenosis: Secondary | ICD-10-CM | POA: Diagnosis not present

## 2020-12-17 DIAGNOSIS — Z79899 Other long term (current) drug therapy: Secondary | ICD-10-CM | POA: Diagnosis not present

## 2020-12-17 DIAGNOSIS — I498 Other specified cardiac arrhythmias: Secondary | ICD-10-CM | POA: Diagnosis not present

## 2020-12-17 DIAGNOSIS — Z7982 Long term (current) use of aspirin: Secondary | ICD-10-CM | POA: Diagnosis not present

## 2020-12-17 DIAGNOSIS — I119 Hypertensive heart disease without heart failure: Secondary | ICD-10-CM | POA: Diagnosis not present

## 2020-12-17 DIAGNOSIS — Z952 Presence of prosthetic heart valve: Secondary | ICD-10-CM | POA: Diagnosis not present

## 2020-12-17 DIAGNOSIS — I10 Essential (primary) hypertension: Secondary | ICD-10-CM | POA: Diagnosis not present

## 2020-12-17 DIAGNOSIS — E785 Hyperlipidemia, unspecified: Secondary | ICD-10-CM | POA: Diagnosis not present

## 2020-12-17 DIAGNOSIS — G4733 Obstructive sleep apnea (adult) (pediatric): Secondary | ICD-10-CM | POA: Diagnosis not present

## 2020-12-17 DIAGNOSIS — Z6841 Body Mass Index (BMI) 40.0 and over, adult: Secondary | ICD-10-CM | POA: Diagnosis not present

## 2020-12-17 DIAGNOSIS — I480 Paroxysmal atrial fibrillation: Secondary | ICD-10-CM | POA: Diagnosis not present

## 2020-12-17 DIAGNOSIS — Z9989 Dependence on other enabling machines and devices: Secondary | ICD-10-CM | POA: Diagnosis not present

## 2020-12-19 DIAGNOSIS — G4733 Obstructive sleep apnea (adult) (pediatric): Secondary | ICD-10-CM | POA: Diagnosis not present

## 2021-01-03 DIAGNOSIS — Z952 Presence of prosthetic heart valve: Secondary | ICD-10-CM | POA: Diagnosis not present

## 2021-01-03 DIAGNOSIS — I480 Paroxysmal atrial fibrillation: Secondary | ICD-10-CM | POA: Diagnosis not present

## 2021-01-03 DIAGNOSIS — I517 Cardiomegaly: Secondary | ICD-10-CM | POA: Diagnosis not present

## 2021-01-11 DIAGNOSIS — I471 Supraventricular tachycardia: Secondary | ICD-10-CM | POA: Diagnosis not present

## 2021-02-13 DIAGNOSIS — M549 Dorsalgia, unspecified: Secondary | ICD-10-CM | POA: Diagnosis not present

## 2021-02-13 DIAGNOSIS — M2550 Pain in unspecified joint: Secondary | ICD-10-CM | POA: Diagnosis not present

## 2021-02-13 DIAGNOSIS — Y990 Civilian activity done for income or pay: Secondary | ICD-10-CM | POA: Diagnosis not present

## 2021-02-13 DIAGNOSIS — Z9889 Other specified postprocedural states: Secondary | ICD-10-CM | POA: Diagnosis not present

## 2021-02-13 DIAGNOSIS — G893 Neoplasm related pain (acute) (chronic): Secondary | ICD-10-CM | POA: Diagnosis not present

## 2021-03-19 DIAGNOSIS — R300 Dysuria: Secondary | ICD-10-CM | POA: Diagnosis not present

## 2021-03-19 DIAGNOSIS — C61 Malignant neoplasm of prostate: Secondary | ICD-10-CM | POA: Diagnosis not present

## 2021-04-03 DIAGNOSIS — Z87442 Personal history of urinary calculi: Secondary | ICD-10-CM | POA: Diagnosis not present

## 2021-04-03 DIAGNOSIS — C61 Malignant neoplasm of prostate: Secondary | ICD-10-CM | POA: Diagnosis not present

## 2021-04-03 DIAGNOSIS — R300 Dysuria: Secondary | ICD-10-CM | POA: Diagnosis not present

## 2021-04-03 DIAGNOSIS — N99112 Postprocedural membranous urethral stricture: Secondary | ICD-10-CM | POA: Diagnosis not present

## 2021-04-11 DIAGNOSIS — Z9989 Dependence on other enabling machines and devices: Secondary | ICD-10-CM | POA: Diagnosis not present

## 2021-04-11 DIAGNOSIS — N138 Other obstructive and reflux uropathy: Secondary | ICD-10-CM | POA: Diagnosis not present

## 2021-04-11 DIAGNOSIS — N99112 Postprocedural membranous urethral stricture: Secondary | ICD-10-CM | POA: Diagnosis not present

## 2021-04-11 DIAGNOSIS — I493 Ventricular premature depolarization: Secondary | ICD-10-CM | POA: Diagnosis not present

## 2021-04-11 DIAGNOSIS — N35813 Other membranous urethral stricture, male: Secondary | ICD-10-CM | POA: Diagnosis not present

## 2021-04-11 DIAGNOSIS — N35919 Unspecified urethral stricture, male, unspecified site: Secondary | ICD-10-CM | POA: Diagnosis not present

## 2021-04-11 DIAGNOSIS — Z01810 Encounter for preprocedural cardiovascular examination: Secondary | ICD-10-CM | POA: Diagnosis not present

## 2021-04-11 DIAGNOSIS — G4730 Sleep apnea, unspecified: Secondary | ICD-10-CM | POA: Diagnosis not present

## 2021-04-11 DIAGNOSIS — N400 Enlarged prostate without lower urinary tract symptoms: Secondary | ICD-10-CM | POA: Diagnosis not present

## 2021-04-11 DIAGNOSIS — N401 Enlarged prostate with lower urinary tract symptoms: Secondary | ICD-10-CM | POA: Diagnosis not present

## 2021-04-16 DIAGNOSIS — R300 Dysuria: Secondary | ICD-10-CM | POA: Diagnosis not present

## 2021-04-16 DIAGNOSIS — N99112 Postprocedural membranous urethral stricture: Secondary | ICD-10-CM | POA: Diagnosis not present

## 2021-04-16 DIAGNOSIS — C61 Malignant neoplasm of prostate: Secondary | ICD-10-CM | POA: Diagnosis not present

## 2021-05-08 DIAGNOSIS — Z9889 Other specified postprocedural states: Secondary | ICD-10-CM | POA: Diagnosis not present

## 2021-05-08 DIAGNOSIS — Z5181 Encounter for therapeutic drug level monitoring: Secondary | ICD-10-CM | POA: Diagnosis not present

## 2021-05-08 DIAGNOSIS — M549 Dorsalgia, unspecified: Secondary | ICD-10-CM | POA: Diagnosis not present

## 2021-05-08 DIAGNOSIS — G893 Neoplasm related pain (acute) (chronic): Secondary | ICD-10-CM | POA: Diagnosis not present

## 2021-05-08 DIAGNOSIS — M5417 Radiculopathy, lumbosacral region: Secondary | ICD-10-CM | POA: Diagnosis not present

## 2021-05-08 DIAGNOSIS — M2550 Pain in unspecified joint: Secondary | ICD-10-CM | POA: Diagnosis not present

## 2021-05-08 DIAGNOSIS — Z79899 Other long term (current) drug therapy: Secondary | ICD-10-CM | POA: Diagnosis not present

## 2021-05-09 DIAGNOSIS — Z8546 Personal history of malignant neoplasm of prostate: Secondary | ICD-10-CM | POA: Diagnosis not present

## 2021-05-09 DIAGNOSIS — R300 Dysuria: Secondary | ICD-10-CM | POA: Diagnosis not present

## 2021-05-09 DIAGNOSIS — N99112 Postprocedural membranous urethral stricture: Secondary | ICD-10-CM | POA: Diagnosis not present

## 2021-05-15 DIAGNOSIS — Z86010 Personal history of colonic polyps: Secondary | ICD-10-CM | POA: Diagnosis not present

## 2021-05-15 DIAGNOSIS — K625 Hemorrhage of anus and rectum: Secondary | ICD-10-CM | POA: Diagnosis not present

## 2021-05-15 DIAGNOSIS — C61 Malignant neoplasm of prostate: Secondary | ICD-10-CM | POA: Diagnosis not present

## 2021-05-21 DIAGNOSIS — C61 Malignant neoplasm of prostate: Secondary | ICD-10-CM | POA: Diagnosis not present

## 2021-05-22 DIAGNOSIS — E559 Vitamin D deficiency, unspecified: Secondary | ICD-10-CM | POA: Diagnosis not present

## 2021-05-22 DIAGNOSIS — Z9181 History of falling: Secondary | ICD-10-CM | POA: Diagnosis not present

## 2021-05-22 DIAGNOSIS — E782 Mixed hyperlipidemia: Secondary | ICD-10-CM | POA: Diagnosis not present

## 2021-05-22 DIAGNOSIS — R739 Hyperglycemia, unspecified: Secondary | ICD-10-CM | POA: Diagnosis not present

## 2021-05-22 DIAGNOSIS — I4891 Unspecified atrial fibrillation: Secondary | ICD-10-CM | POA: Diagnosis not present

## 2021-05-22 DIAGNOSIS — Z79899 Other long term (current) drug therapy: Secondary | ICD-10-CM | POA: Diagnosis not present

## 2021-05-24 DIAGNOSIS — K625 Hemorrhage of anus and rectum: Secondary | ICD-10-CM | POA: Diagnosis not present

## 2021-05-28 DIAGNOSIS — K760 Fatty (change of) liver, not elsewhere classified: Secondary | ICD-10-CM | POA: Diagnosis not present

## 2021-05-28 DIAGNOSIS — C61 Malignant neoplasm of prostate: Secondary | ICD-10-CM | POA: Diagnosis not present

## 2021-05-28 DIAGNOSIS — K625 Hemorrhage of anus and rectum: Secondary | ICD-10-CM | POA: Diagnosis not present

## 2021-05-28 DIAGNOSIS — N200 Calculus of kidney: Secondary | ICD-10-CM | POA: Diagnosis not present

## 2021-05-28 DIAGNOSIS — K5790 Diverticulosis of intestine, part unspecified, without perforation or abscess without bleeding: Secondary | ICD-10-CM | POA: Diagnosis not present

## 2021-05-28 DIAGNOSIS — Z79818 Long term (current) use of other agents affecting estrogen receptors and estrogen levels: Secondary | ICD-10-CM | POA: Diagnosis not present

## 2021-05-28 DIAGNOSIS — N99112 Postprocedural membranous urethral stricture: Secondary | ICD-10-CM | POA: Diagnosis not present

## 2021-05-28 DIAGNOSIS — K8020 Calculus of gallbladder without cholecystitis without obstruction: Secondary | ICD-10-CM | POA: Diagnosis not present

## 2021-07-01 DIAGNOSIS — D122 Benign neoplasm of ascending colon: Secondary | ICD-10-CM | POA: Diagnosis not present

## 2021-07-01 DIAGNOSIS — C61 Malignant neoplasm of prostate: Secondary | ICD-10-CM | POA: Diagnosis not present

## 2021-07-01 DIAGNOSIS — Z86010 Personal history of colonic polyps: Secondary | ICD-10-CM | POA: Diagnosis not present

## 2021-07-01 DIAGNOSIS — K5730 Diverticulosis of large intestine without perforation or abscess without bleeding: Secondary | ICD-10-CM | POA: Diagnosis not present

## 2021-07-01 DIAGNOSIS — D124 Benign neoplasm of descending colon: Secondary | ICD-10-CM | POA: Diagnosis not present

## 2021-07-01 DIAGNOSIS — K627 Radiation proctitis: Secondary | ICD-10-CM | POA: Diagnosis not present

## 2021-07-01 DIAGNOSIS — K625 Hemorrhage of anus and rectum: Secondary | ICD-10-CM | POA: Diagnosis not present

## 2021-07-01 DIAGNOSIS — K635 Polyp of colon: Secondary | ICD-10-CM | POA: Diagnosis not present

## 2021-07-29 DIAGNOSIS — M25511 Pain in right shoulder: Secondary | ICD-10-CM | POA: Diagnosis not present

## 2021-07-29 DIAGNOSIS — M75101 Unspecified rotator cuff tear or rupture of right shoulder, not specified as traumatic: Secondary | ICD-10-CM | POA: Diagnosis not present

## 2021-07-29 DIAGNOSIS — M12811 Other specific arthropathies, not elsewhere classified, right shoulder: Secondary | ICD-10-CM | POA: Diagnosis not present

## 2021-07-31 DIAGNOSIS — M549 Dorsalgia, unspecified: Secondary | ICD-10-CM | POA: Diagnosis not present

## 2021-07-31 DIAGNOSIS — Z981 Arthrodesis status: Secondary | ICD-10-CM | POA: Diagnosis not present

## 2021-07-31 DIAGNOSIS — G893 Neoplasm related pain (acute) (chronic): Secondary | ICD-10-CM | POA: Diagnosis not present

## 2021-07-31 DIAGNOSIS — M47817 Spondylosis without myelopathy or radiculopathy, lumbosacral region: Secondary | ICD-10-CM | POA: Diagnosis not present

## 2021-07-31 DIAGNOSIS — M5137 Other intervertebral disc degeneration, lumbosacral region: Secondary | ICD-10-CM | POA: Diagnosis not present

## 2021-07-31 DIAGNOSIS — M5136 Other intervertebral disc degeneration, lumbar region: Secondary | ICD-10-CM | POA: Diagnosis not present

## 2021-07-31 DIAGNOSIS — M47816 Spondylosis without myelopathy or radiculopathy, lumbar region: Secondary | ICD-10-CM | POA: Diagnosis not present

## 2021-07-31 DIAGNOSIS — M79671 Pain in right foot: Secondary | ICD-10-CM | POA: Diagnosis not present

## 2021-07-31 DIAGNOSIS — M2550 Pain in unspecified joint: Secondary | ICD-10-CM | POA: Diagnosis not present

## 2021-08-08 DIAGNOSIS — M25531 Pain in right wrist: Secondary | ICD-10-CM | POA: Diagnosis not present

## 2021-08-08 DIAGNOSIS — G5603 Carpal tunnel syndrome, bilateral upper limbs: Secondary | ICD-10-CM | POA: Diagnosis not present

## 2021-08-08 DIAGNOSIS — M25562 Pain in left knee: Secondary | ICD-10-CM | POA: Diagnosis not present

## 2021-08-08 DIAGNOSIS — M25532 Pain in left wrist: Secondary | ICD-10-CM | POA: Diagnosis not present

## 2021-08-08 DIAGNOSIS — M25561 Pain in right knee: Secondary | ICD-10-CM | POA: Diagnosis not present

## 2021-08-12 DIAGNOSIS — Z87438 Personal history of other diseases of male genital organs: Secondary | ICD-10-CM | POA: Diagnosis not present

## 2021-08-12 DIAGNOSIS — C61 Malignant neoplasm of prostate: Secondary | ICD-10-CM | POA: Diagnosis not present

## 2021-08-12 DIAGNOSIS — N3941 Urge incontinence: Secondary | ICD-10-CM | POA: Diagnosis not present

## 2021-09-05 DIAGNOSIS — G5621 Lesion of ulnar nerve, right upper limb: Secondary | ICD-10-CM | POA: Diagnosis not present

## 2021-09-05 DIAGNOSIS — E119 Type 2 diabetes mellitus without complications: Secondary | ICD-10-CM | POA: Diagnosis not present

## 2021-09-05 DIAGNOSIS — M25531 Pain in right wrist: Secondary | ICD-10-CM | POA: Diagnosis not present

## 2021-09-05 DIAGNOSIS — G5603 Carpal tunnel syndrome, bilateral upper limbs: Secondary | ICD-10-CM | POA: Diagnosis not present

## 2021-09-20 DIAGNOSIS — G4733 Obstructive sleep apnea (adult) (pediatric): Secondary | ICD-10-CM | POA: Diagnosis not present

## 2021-10-10 DIAGNOSIS — G5620 Lesion of ulnar nerve, unspecified upper limb: Secondary | ICD-10-CM | POA: Diagnosis not present

## 2021-10-10 DIAGNOSIS — M65342 Trigger finger, left ring finger: Secondary | ICD-10-CM | POA: Diagnosis not present

## 2021-10-10 DIAGNOSIS — G5603 Carpal tunnel syndrome, bilateral upper limbs: Secondary | ICD-10-CM | POA: Diagnosis not present

## 2021-10-16 DIAGNOSIS — G4733 Obstructive sleep apnea (adult) (pediatric): Secondary | ICD-10-CM | POA: Diagnosis not present

## 2021-10-16 DIAGNOSIS — I10 Essential (primary) hypertension: Secondary | ICD-10-CM | POA: Diagnosis not present

## 2021-10-16 DIAGNOSIS — M159 Polyosteoarthritis, unspecified: Secondary | ICD-10-CM | POA: Diagnosis not present

## 2021-10-16 DIAGNOSIS — I350 Nonrheumatic aortic (valve) stenosis: Secondary | ICD-10-CM | POA: Diagnosis not present

## 2021-10-16 DIAGNOSIS — Z6841 Body Mass Index (BMI) 40.0 and over, adult: Secondary | ICD-10-CM | POA: Diagnosis not present

## 2021-10-17 ENCOUNTER — Other Ambulatory Visit (HOSPITAL_BASED_OUTPATIENT_CLINIC_OR_DEPARTMENT_OTHER): Payer: Self-pay

## 2021-10-17 ENCOUNTER — Emergency Department (HOSPITAL_BASED_OUTPATIENT_CLINIC_OR_DEPARTMENT_OTHER)
Admission: EM | Admit: 2021-10-17 | Discharge: 2021-10-17 | Disposition: A | Discharge status: Home / Self Care | Payer: BC Managed Care – PPO | Attending: Emergency Medicine | Admitting: Emergency Medicine

## 2021-10-17 ENCOUNTER — Encounter (HOSPITAL_BASED_OUTPATIENT_CLINIC_OR_DEPARTMENT_OTHER): Payer: Self-pay | Admitting: Emergency Medicine

## 2021-10-17 ENCOUNTER — Emergency Department (HOSPITAL_BASED_OUTPATIENT_CLINIC_OR_DEPARTMENT_OTHER): Payer: BC Managed Care – PPO

## 2021-10-17 ENCOUNTER — Other Ambulatory Visit: Payer: Self-pay

## 2021-10-17 DIAGNOSIS — Z79899 Other long term (current) drug therapy: Secondary | ICD-10-CM | POA: Insufficient documentation

## 2021-10-17 DIAGNOSIS — Z7982 Long term (current) use of aspirin: Secondary | ICD-10-CM | POA: Diagnosis not present

## 2021-10-17 DIAGNOSIS — M7989 Other specified soft tissue disorders: Secondary | ICD-10-CM | POA: Diagnosis not present

## 2021-10-17 DIAGNOSIS — M25532 Pain in left wrist: Secondary | ICD-10-CM | POA: Insufficient documentation

## 2021-10-17 DIAGNOSIS — I10 Essential (primary) hypertension: Secondary | ICD-10-CM | POA: Diagnosis not present

## 2021-10-17 DIAGNOSIS — M189 Osteoarthritis of first carpometacarpal joint, unspecified: Secondary | ICD-10-CM | POA: Diagnosis not present

## 2021-10-17 MED ORDER — DICLOFENAC SODIUM 1 % EX GEL
4.0000 g | Freq: Four times a day (QID) | CUTANEOUS | 0 refills | Status: DC
  Filled 2021-10-17: qty 100, 30d supply, fill #0

## 2021-10-17 NOTE — ED Provider Notes (Signed)
[name]   CSN: [id number] Arrival date & time: 10/17/21  1259     History  Chief Complaint  Patient presents with   Hand Pain    left    [name] is a 66 y.o. male.  66 yo M with a chief complaint of left wrist pain.  This been going on for couple days now.  Woke up and it was hurting him.  Denies any obvious injury to the area.  Has been using ice off-and-on.  Took an oxycodone tablet this morning with some improvement.  He denies fevers denies redness.  Pain mostly to the dorsal aspect of the wrist.   Hand Pain       Home Medications Prior to Admission medications   Medication Sig Start Date End Date Taking? Authorizing Provider  diclofenac Sodium (VOLTAREN) 1 % GEL Apply 4 g topically 4 (four) times daily. 10/17/21  Yes Deno Etienne, DO  aspirin 325 MG tablet Take 1 tablet (325 mg total) by mouth daily. 09/27/13   Consuella Lose, MD  atorvastatin (LIPITOR) 40 MG tablet Take 40 mg by mouth daily.    [provider]  Choline Fenofibrate (TRILIPIX) 135 MG capsule Take 135 mg by mouth daily.    [provider]  etodolac (LODINE) 400 MG tablet Take 400 mg by mouth 2 (two) times daily.    [provider]  lisinopril (PRINIVIL,ZESTRIL) 20 MG tablet Take 20 mg by mouth daily.    [provider]  nebivolol (BYSTOLIC) 5 MG tablet Take 5 mg by mouth daily.    [provider]  OxyCODONE (OXYCONTIN) 20 mg T12A 12 hr tablet Take one tablet by mouth at bedtime. Do not cut/crush/chew 09/28/13   Blanchie Serve, MD  Oxycodone HCl 10 MG TABS Take one to two tablets by mouth three times daily as needed for pain 09/26/13   Reed, Tiffany L, DO  Oxycodone HCl 20 MG TABS Take 10-20 mg by mouth 3 (three) times daily as needed (severe pain).     [provider]  triamterene-hydrochlorothiazide (MAXZIDE) 75-50 MG per tablet Take 1 tablet by mouth daily.    [provider]  Vitamin D,  Ergocalciferol, (DRISDOL) 50000 UNITS CAPS capsule Take 50,000 Units by mouth every 7 (seven) days. Takes on wednesday    [provider]      Allergies    Patient has no known allergies.    Review of Systems   Review of Systems  Physical Exam Updated Vital Signs BP 120/72   Pulse 80   Temp 98.3 F (36.8 C)   Resp 17   Ht 6\' 2"  (1.88 m)   Wt (!) 154.2 kg   SpO2 97%   BMI 43.65 kg/m  Physical Exam Vitals and nursing note reviewed.  Constitutional:      Appearance: He is well-developed.  HENT:     Head: Normocephalic and atraumatic.  Eyes:     Pupils: Pupils are equal, round, and reactive to light.  Neck:     Vascular: No JVD.  Cardiovascular:     Rate and Rhythm: Normal rate and regular rhythm.     Heart sounds: No murmur heard.    No friction rub. No gallop.  Pulmonary:     Effort: No respiratory distress.     Breath sounds: No wheezing.  Abdominal:     General: There is no distension.     Tenderness: There is no abdominal tenderness. There is no  guarding or rebound.  Musculoskeletal:        General: Swelling and tenderness present. Normal range of motion.     Cervical back: Normal range of motion and neck supple.     Comments: Pain and swelling about the left wrist.  He has some pain in the snuffbox.  Some edema about the joint.  I am able to range it without significant discomfort.  Intact pulse motor and sensation.  No pain at the elbow.  Skin:    Coloration: Skin is not pale.     Findings: No rash.  Neurological:     Mental Status: He is alert and oriented to person, place, and time.  Psychiatric:        Behavior: Behavior normal.     ED Results / Procedures / Treatments   Labs (all labs ordered are listed, but only abnormal results are displayed) Labs Reviewed - No data to display  EKG None  Radiology DG Wrist Complete Left  Result Date: 10/17/2021 CLINICAL DATA:  Wrist pain EXAM: LEFT WRIST - COMPLETE 4 VIEW COMPARISON:  None  Available. FINDINGS: No evidence of fracture or dislocation. Mild degenerative changes of the triscaphe and first CMC joints. Soft tissue swelling about wrist. IMPRESSION: 1. No acute fracture or dislocation. 2. Mild degenerative changes of the triscaphe and first CMC joints. Electronically Signed   By: Yetta Glassman M.D.   On: 10/17/2021 14:40    Procedures Procedures    Medications Ordered in ED Medications - No data to display  ED Course/ Medical Decision Making/ A&P                           Medical Decision Making Amount and/or Complexity of Data Reviewed Radiology: ordered.   66 yo M with a chief complaint of left wrist pain.  Going on for about 48 hours or so.  Atraumatic.  No history of gout.  I am able to range it a bit more than I would expect if it were septic arthritis.  We will obtain a plain film to assess for possible occult fracture.  Plain film independently interpreted by me with some chronic appearing deformity to the scaphoid bone.  Radiology read with likely arthritis.  We will have him continue his splint.  Diclofenac gel.  Hand surgery follow-up.  3:22 PM:  I have discussed the diagnosis/risks/treatment options with the patient and family.  Evaluation and diagnostic testing in the emergency department does not suggest an emergent condition requiring admission or immediate intervention beyond what has been performed at this time.  They will follow up with Ortho. We also discussed returning to the ED immediately if new or worsening sx occur. We discussed the sx which are most concerning (e.g., sudden worsening pain, fever, inability to tolerate by mouth) that necessitate immediate return. Medications administered to the patient during their visit and any new prescriptions provided to the patient are listed below.  Medications given during this visit Medications - No data to display   The patient appears reasonably screen and/or stabilized for discharge and I doubt  any other medical condition or other [facility] requiring further screening, evaluation, or treatment in the ED at this time prior to discharge.          Final Clinical Impression(s) / ED Diagnoses Final diagnoses:  Left wrist pain    Rx / DC Orders ED Discharge Orders          Ordered  diclofenac Sodium (VOLTAREN) 1 % GEL  4 times daily        10/17/21 [place], Virdie Penning, DO 10/17/21 1522

## 2021-10-17 NOTE — ED Triage Notes (Signed)
Left hand pain , , presents with hand splint , unknown injury ,

## 2021-10-17 NOTE — Discharge Instructions (Addendum)
Your x-ray did not show an obvious fracture.  Please call your orthopedist and let them know you are having worsening pain to that wrist.  Use the gel as prescribed.  Also take tylenol 1000mg (2 extra strength) four times a day.

## 2021-10-23 DIAGNOSIS — G893 Neoplasm related pain (acute) (chronic): Secondary | ICD-10-CM | POA: Diagnosis not present

## 2021-10-23 DIAGNOSIS — Z79899 Other long term (current) drug therapy: Secondary | ICD-10-CM | POA: Diagnosis not present

## 2021-10-23 DIAGNOSIS — M549 Dorsalgia, unspecified: Secondary | ICD-10-CM | POA: Diagnosis not present

## 2021-10-23 DIAGNOSIS — M5417 Radiculopathy, lumbosacral region: Secondary | ICD-10-CM | POA: Diagnosis not present

## 2021-10-23 DIAGNOSIS — Z5181 Encounter for therapeutic drug level monitoring: Secondary | ICD-10-CM | POA: Diagnosis not present

## 2021-10-23 DIAGNOSIS — M79671 Pain in right foot: Secondary | ICD-10-CM | POA: Diagnosis not present

## 2021-11-22 DIAGNOSIS — Z9109 Other allergy status, other than to drugs and biological substances: Secondary | ICD-10-CM | POA: Diagnosis not present

## 2021-11-22 DIAGNOSIS — Z23 Encounter for immunization: Secondary | ICD-10-CM | POA: Diagnosis not present

## 2021-11-22 DIAGNOSIS — E782 Mixed hyperlipidemia: Secondary | ICD-10-CM | POA: Diagnosis not present

## 2021-11-22 DIAGNOSIS — R739 Hyperglycemia, unspecified: Secondary | ICD-10-CM | POA: Diagnosis not present

## 2021-11-22 DIAGNOSIS — Z79899 Other long term (current) drug therapy: Secondary | ICD-10-CM | POA: Diagnosis not present

## 2021-11-22 DIAGNOSIS — I4891 Unspecified atrial fibrillation: Secondary | ICD-10-CM | POA: Diagnosis not present

## 2021-11-22 DIAGNOSIS — I10 Essential (primary) hypertension: Secondary | ICD-10-CM | POA: Diagnosis not present

## 2021-11-22 DIAGNOSIS — E559 Vitamin D deficiency, unspecified: Secondary | ICD-10-CM | POA: Diagnosis not present

## 2021-11-25 DIAGNOSIS — D649 Anemia, unspecified: Secondary | ICD-10-CM | POA: Diagnosis not present

## 2021-12-09 DIAGNOSIS — E119 Type 2 diabetes mellitus without complications: Secondary | ICD-10-CM | POA: Diagnosis not present

## 2021-12-09 DIAGNOSIS — Z01812 Encounter for preprocedural laboratory examination: Secondary | ICD-10-CM | POA: Diagnosis not present

## 2021-12-09 DIAGNOSIS — E669 Obesity, unspecified: Secondary | ICD-10-CM | POA: Diagnosis not present

## 2021-12-09 DIAGNOSIS — G4730 Sleep apnea, unspecified: Secondary | ICD-10-CM | POA: Diagnosis not present

## 2021-12-09 DIAGNOSIS — G5622 Lesion of ulnar nerve, left upper limb: Secondary | ICD-10-CM | POA: Diagnosis not present

## 2021-12-09 DIAGNOSIS — G5602 Carpal tunnel syndrome, left upper limb: Secondary | ICD-10-CM | POA: Diagnosis not present

## 2021-12-09 DIAGNOSIS — Z951 Presence of aortocoronary bypass graft: Secondary | ICD-10-CM | POA: Diagnosis not present

## 2021-12-09 DIAGNOSIS — Z6841 Body Mass Index (BMI) 40.0 and over, adult: Secondary | ICD-10-CM | POA: Diagnosis not present

## 2021-12-09 DIAGNOSIS — Z01810 Encounter for preprocedural cardiovascular examination: Secondary | ICD-10-CM | POA: Diagnosis not present

## 2021-12-09 DIAGNOSIS — Z87891 Personal history of nicotine dependence: Secondary | ICD-10-CM | POA: Diagnosis not present

## 2021-12-09 DIAGNOSIS — M65342 Trigger finger, left ring finger: Secondary | ICD-10-CM | POA: Diagnosis not present

## 2021-12-09 DIAGNOSIS — I2510 Atherosclerotic heart disease of native coronary artery without angina pectoris: Secondary | ICD-10-CM | POA: Diagnosis not present

## 2021-12-11 DIAGNOSIS — Z952 Presence of prosthetic heart valve: Secondary | ICD-10-CM | POA: Diagnosis not present

## 2021-12-11 DIAGNOSIS — G5622 Lesion of ulnar nerve, left upper limb: Secondary | ICD-10-CM | POA: Diagnosis not present

## 2021-12-11 DIAGNOSIS — I2510 Atherosclerotic heart disease of native coronary artery without angina pectoris: Secondary | ICD-10-CM | POA: Diagnosis not present

## 2021-12-11 DIAGNOSIS — G5602 Carpal tunnel syndrome, left upper limb: Secondary | ICD-10-CM | POA: Diagnosis not present

## 2021-12-11 DIAGNOSIS — Z87891 Personal history of nicotine dependence: Secondary | ICD-10-CM | POA: Diagnosis not present

## 2021-12-11 DIAGNOSIS — Z9682 Presence of neurostimulator: Secondary | ICD-10-CM | POA: Diagnosis not present

## 2021-12-11 DIAGNOSIS — G4733 Obstructive sleep apnea (adult) (pediatric): Secondary | ICD-10-CM | POA: Diagnosis not present

## 2021-12-11 DIAGNOSIS — M961 Postlaminectomy syndrome, not elsewhere classified: Secondary | ICD-10-CM | POA: Diagnosis not present

## 2021-12-11 DIAGNOSIS — I350 Nonrheumatic aortic (valve) stenosis: Secondary | ICD-10-CM | POA: Diagnosis not present

## 2021-12-11 DIAGNOSIS — Z951 Presence of aortocoronary bypass graft: Secondary | ICD-10-CM | POA: Diagnosis not present

## 2021-12-11 DIAGNOSIS — M65342 Trigger finger, left ring finger: Secondary | ICD-10-CM | POA: Diagnosis not present

## 2021-12-20 DIAGNOSIS — K627 Radiation proctitis: Secondary | ICD-10-CM | POA: Diagnosis not present

## 2021-12-20 DIAGNOSIS — R195 Other fecal abnormalities: Secondary | ICD-10-CM | POA: Diagnosis not present

## 2021-12-20 DIAGNOSIS — Z791 Long term (current) use of non-steroidal anti-inflammatories (NSAID): Secondary | ICD-10-CM | POA: Diagnosis not present

## 2021-12-20 DIAGNOSIS — D500 Iron deficiency anemia secondary to blood loss (chronic): Secondary | ICD-10-CM | POA: Diagnosis not present

## 2021-12-26 DIAGNOSIS — G5602 Carpal tunnel syndrome, left upper limb: Secondary | ICD-10-CM | POA: Diagnosis not present

## 2022-01-15 DIAGNOSIS — G893 Neoplasm related pain (acute) (chronic): Secondary | ICD-10-CM | POA: Diagnosis not present

## 2022-01-15 DIAGNOSIS — M2550 Pain in unspecified joint: Secondary | ICD-10-CM | POA: Diagnosis not present

## 2022-01-15 DIAGNOSIS — M79671 Pain in right foot: Secondary | ICD-10-CM | POA: Diagnosis not present

## 2022-01-15 DIAGNOSIS — Z9889 Other specified postprocedural states: Secondary | ICD-10-CM | POA: Diagnosis not present

## 2022-01-22 DIAGNOSIS — C61 Malignant neoplasm of prostate: Secondary | ICD-10-CM | POA: Diagnosis not present

## 2022-01-23 DIAGNOSIS — C61 Malignant neoplasm of prostate: Secondary | ICD-10-CM | POA: Diagnosis not present

## 2022-02-04 DIAGNOSIS — I480 Paroxysmal atrial fibrillation: Secondary | ICD-10-CM | POA: Diagnosis not present

## 2022-02-04 DIAGNOSIS — I10 Essential (primary) hypertension: Secondary | ICD-10-CM | POA: Diagnosis not present

## 2022-02-04 DIAGNOSIS — G4733 Obstructive sleep apnea (adult) (pediatric): Secondary | ICD-10-CM | POA: Diagnosis not present

## 2022-02-04 DIAGNOSIS — I350 Nonrheumatic aortic (valve) stenosis: Secondary | ICD-10-CM | POA: Diagnosis not present

## 2022-02-10 DIAGNOSIS — G4730 Sleep apnea, unspecified: Secondary | ICD-10-CM | POA: Diagnosis not present

## 2022-02-10 DIAGNOSIS — K5520 Angiodysplasia of colon without hemorrhage: Secondary | ICD-10-CM | POA: Diagnosis not present

## 2022-02-10 DIAGNOSIS — R195 Other fecal abnormalities: Secondary | ICD-10-CM | POA: Diagnosis not present

## 2022-02-10 DIAGNOSIS — E119 Type 2 diabetes mellitus without complications: Secondary | ICD-10-CM | POA: Diagnosis not present

## 2022-02-10 DIAGNOSIS — K5521 Angiodysplasia of colon with hemorrhage: Secondary | ICD-10-CM | POA: Diagnosis not present

## 2022-02-10 DIAGNOSIS — Z903 Acquired absence of stomach [part of]: Secondary | ICD-10-CM | POA: Diagnosis not present

## 2022-02-10 DIAGNOSIS — K627 Radiation proctitis: Secondary | ICD-10-CM | POA: Diagnosis not present

## 2022-02-10 DIAGNOSIS — Z87891 Personal history of nicotine dependence: Secondary | ICD-10-CM | POA: Diagnosis not present

## 2022-02-10 DIAGNOSIS — Z791 Long term (current) use of non-steroidal anti-inflammatories (NSAID): Secondary | ICD-10-CM | POA: Diagnosis not present

## 2022-02-10 DIAGNOSIS — K5730 Diverticulosis of large intestine without perforation or abscess without bleeding: Secondary | ICD-10-CM | POA: Diagnosis not present

## 2022-02-10 DIAGNOSIS — D500 Iron deficiency anemia secondary to blood loss (chronic): Secondary | ICD-10-CM | POA: Diagnosis not present

## 2022-02-10 DIAGNOSIS — Z9884 Bariatric surgery status: Secondary | ICD-10-CM | POA: Diagnosis not present

## 2022-02-18 DIAGNOSIS — Z9889 Other specified postprocedural states: Secondary | ICD-10-CM | POA: Diagnosis not present

## 2022-02-18 DIAGNOSIS — M549 Dorsalgia, unspecified: Secondary | ICD-10-CM | POA: Diagnosis not present

## 2022-02-18 DIAGNOSIS — G893 Neoplasm related pain (acute) (chronic): Secondary | ICD-10-CM | POA: Diagnosis not present

## 2022-02-18 DIAGNOSIS — M79671 Pain in right foot: Secondary | ICD-10-CM | POA: Diagnosis not present

## 2022-04-09 DIAGNOSIS — M549 Dorsalgia, unspecified: Secondary | ICD-10-CM | POA: Diagnosis not present

## 2022-04-09 DIAGNOSIS — M461 Sacroiliitis, not elsewhere classified: Secondary | ICD-10-CM | POA: Diagnosis not present

## 2022-04-09 DIAGNOSIS — M160 Bilateral primary osteoarthritis of hip: Secondary | ICD-10-CM | POA: Diagnosis not present

## 2022-04-09 DIAGNOSIS — G893 Neoplasm related pain (acute) (chronic): Secondary | ICD-10-CM | POA: Diagnosis not present

## 2022-04-09 DIAGNOSIS — Z981 Arthrodesis status: Secondary | ICD-10-CM | POA: Diagnosis not present

## 2022-04-09 DIAGNOSIS — M47817 Spondylosis without myelopathy or radiculopathy, lumbosacral region: Secondary | ICD-10-CM | POA: Diagnosis not present

## 2022-04-09 DIAGNOSIS — M533 Sacrococcygeal disorders, not elsewhere classified: Secondary | ICD-10-CM | POA: Diagnosis not present

## 2022-04-09 DIAGNOSIS — Z9889 Other specified postprocedural states: Secondary | ICD-10-CM | POA: Diagnosis not present

## 2022-04-09 DIAGNOSIS — M47816 Spondylosis without myelopathy or radiculopathy, lumbar region: Secondary | ICD-10-CM | POA: Diagnosis not present

## 2022-04-17 DIAGNOSIS — Z6841 Body Mass Index (BMI) 40.0 and over, adult: Secondary | ICD-10-CM | POA: Diagnosis not present

## 2022-04-17 DIAGNOSIS — G4733 Obstructive sleep apnea (adult) (pediatric): Secondary | ICD-10-CM | POA: Diagnosis not present

## 2022-04-17 DIAGNOSIS — I10 Essential (primary) hypertension: Secondary | ICD-10-CM | POA: Diagnosis not present

## 2022-04-17 DIAGNOSIS — G894 Chronic pain syndrome: Secondary | ICD-10-CM | POA: Diagnosis not present

## 2022-04-17 DIAGNOSIS — I350 Nonrheumatic aortic (valve) stenosis: Secondary | ICD-10-CM | POA: Diagnosis not present

## 2022-04-25 ENCOUNTER — Other Ambulatory Visit: Payer: Self-pay

## 2022-04-25 ENCOUNTER — Inpatient Hospital Stay (HOSPITAL_BASED_OUTPATIENT_CLINIC_OR_DEPARTMENT_OTHER)
Admission: EM | Admit: 2022-04-25 | Discharge: 2022-04-28 | DRG: 868 | Disposition: A | Discharge status: Home / Self Care | Payer: BC Managed Care – PPO | Attending: Internal Medicine | Admitting: Internal Medicine

## 2022-04-25 ENCOUNTER — Other Ambulatory Visit: Payer: Self-pay | Admitting: Emergency Medicine

## 2022-04-25 ENCOUNTER — Encounter (HOSPITAL_BASED_OUTPATIENT_CLINIC_OR_DEPARTMENT_OTHER): Payer: Self-pay | Admitting: Emergency Medicine

## 2022-04-25 ENCOUNTER — Emergency Department (HOSPITAL_BASED_OUTPATIENT_CLINIC_OR_DEPARTMENT_OTHER): Payer: BC Managed Care – PPO

## 2022-04-25 DIAGNOSIS — I9589 Other hypotension: Secondary | ICD-10-CM | POA: Diagnosis not present

## 2022-04-25 DIAGNOSIS — G8929 Other chronic pain: Secondary | ICD-10-CM | POA: Diagnosis present

## 2022-04-25 DIAGNOSIS — Z79899 Other long term (current) drug therapy: Secondary | ICD-10-CM

## 2022-04-25 DIAGNOSIS — A681 Tick-borne relapsing fever: Principal | ICD-10-CM | POA: Diagnosis present

## 2022-04-25 DIAGNOSIS — I959 Hypotension, unspecified: Secondary | ICD-10-CM

## 2022-04-25 DIAGNOSIS — Z6841 Body Mass Index (BMI) 40.0 and over, adult: Secondary | ICD-10-CM

## 2022-04-25 DIAGNOSIS — G894 Chronic pain syndrome: Secondary | ICD-10-CM

## 2022-04-25 DIAGNOSIS — Z79891 Long term (current) use of opiate analgesic: Secondary | ICD-10-CM | POA: Diagnosis not present

## 2022-04-25 DIAGNOSIS — E1165 Type 2 diabetes mellitus with hyperglycemia: Secondary | ICD-10-CM | POA: Diagnosis present

## 2022-04-25 DIAGNOSIS — E860 Dehydration: Secondary | ICD-10-CM | POA: Diagnosis present

## 2022-04-25 DIAGNOSIS — D696 Thrombocytopenia, unspecified: Secondary | ICD-10-CM | POA: Diagnosis present

## 2022-04-25 DIAGNOSIS — G4733 Obstructive sleep apnea (adult) (pediatric): Secondary | ICD-10-CM

## 2022-04-25 DIAGNOSIS — Z87440 Personal history of urinary (tract) infections: Secondary | ICD-10-CM

## 2022-04-25 DIAGNOSIS — Z952 Presence of prosthetic heart valve: Secondary | ICD-10-CM | POA: Diagnosis not present

## 2022-04-25 DIAGNOSIS — I480 Paroxysmal atrial fibrillation: Secondary | ICD-10-CM | POA: Diagnosis not present

## 2022-04-25 DIAGNOSIS — Z7982 Long term (current) use of aspirin: Secondary | ICD-10-CM

## 2022-04-25 DIAGNOSIS — E871 Hypo-osmolality and hyponatremia: Secondary | ICD-10-CM

## 2022-04-25 DIAGNOSIS — E119 Type 2 diabetes mellitus without complications: Secondary | ICD-10-CM

## 2022-04-25 DIAGNOSIS — Z7951 Long term (current) use of inhaled steroids: Secondary | ICD-10-CM

## 2022-04-25 DIAGNOSIS — E66813 Obesity, class 3: Secondary | ICD-10-CM

## 2022-04-25 DIAGNOSIS — K8020 Calculus of gallbladder without cholecystitis without obstruction: Secondary | ICD-10-CM | POA: Diagnosis present

## 2022-04-25 DIAGNOSIS — E785 Hyperlipidemia, unspecified: Secondary | ICD-10-CM | POA: Diagnosis not present

## 2022-04-25 DIAGNOSIS — I252 Old myocardial infarction: Secondary | ICD-10-CM

## 2022-04-25 DIAGNOSIS — D649 Anemia, unspecified: Secondary | ICD-10-CM | POA: Diagnosis present

## 2022-04-25 DIAGNOSIS — Z86010 Personal history of colonic polyps: Secondary | ICD-10-CM

## 2022-04-25 DIAGNOSIS — Z1152 Encounter for screening for COVID-19: Secondary | ICD-10-CM | POA: Diagnosis not present

## 2022-04-25 DIAGNOSIS — Z87442 Personal history of urinary calculi: Secondary | ICD-10-CM

## 2022-04-25 DIAGNOSIS — R6510 Systemic inflammatory response syndrome (SIRS) of non-infectious origin without acute organ dysfunction: Secondary | ICD-10-CM | POA: Diagnosis present

## 2022-04-25 DIAGNOSIS — R509 Fever, unspecified: Secondary | ICD-10-CM | POA: Diagnosis not present

## 2022-04-25 DIAGNOSIS — Z87891 Personal history of nicotine dependence: Secondary | ICD-10-CM

## 2022-04-25 DIAGNOSIS — D72819 Decreased white blood cell count, unspecified: Secondary | ICD-10-CM | POA: Diagnosis present

## 2022-04-25 DIAGNOSIS — K5730 Diverticulosis of large intestine without perforation or abscess without bleeding: Secondary | ICD-10-CM | POA: Diagnosis not present

## 2022-04-25 DIAGNOSIS — W1830XA Fall on same level, unspecified, initial encounter: Secondary | ICD-10-CM | POA: Diagnosis present

## 2022-04-25 DIAGNOSIS — Z8546 Personal history of malignant neoplasm of prostate: Secondary | ICD-10-CM

## 2022-04-25 DIAGNOSIS — A419 Sepsis, unspecified organism: Secondary | ICD-10-CM | POA: Diagnosis not present

## 2022-04-25 DIAGNOSIS — N179 Acute kidney failure, unspecified: Secondary | ICD-10-CM

## 2022-04-25 DIAGNOSIS — I10 Essential (primary) hypertension: Secondary | ICD-10-CM | POA: Diagnosis not present

## 2022-04-25 DIAGNOSIS — G9340 Encephalopathy, unspecified: Secondary | ICD-10-CM | POA: Diagnosis not present

## 2022-04-25 DIAGNOSIS — Z9682 Presence of neurostimulator: Secondary | ICD-10-CM

## 2022-04-25 LAB — RESP PANEL BY RT-PCR (RSV, FLU A&B, COVID)  RVPGX2
Influenza A by PCR: NEGATIVE
Influenza B by PCR: NEGATIVE
Resp Syncytial Virus by PCR: NEGATIVE
SARS Coronavirus 2 by RT PCR: NEGATIVE

## 2022-04-25 LAB — CBC WITH DIFFERENTIAL/PLATELET
Abs Immature Granulocytes: 0.02 10*3/uL (ref 0.00–0.07)
Basophils Absolute: 0 10*3/uL (ref 0.0–0.1)
Basophils Relative: 1 %
Eosinophils Absolute: 0 10*3/uL (ref 0.0–0.5)
Eosinophils Relative: 0 %
HCT: 32 % — ABNORMAL LOW (ref 39.0–52.0)
Hemoglobin: 10.5 g/dL — ABNORMAL LOW (ref 13.0–17.0)
Immature Granulocytes: 1 %
Lymphocytes Relative: 13 %
Lymphs Abs: 0.5 10*3/uL — ABNORMAL LOW (ref 0.7–4.0)
MCH: 27.1 pg (ref 26.0–34.0)
MCHC: 32.8 g/dL (ref 30.0–36.0)
MCV: 82.7 fL (ref 80.0–100.0)
Monocytes Absolute: 0.7 10*3/uL (ref 0.1–1.0)
Monocytes Relative: 20 %
Neutro Abs: 2.4 10*3/uL (ref 1.7–7.7)
Neutrophils Relative %: 65 %
Platelets: 164 10*3/uL (ref 150–400)
RBC: 3.87 MIL/uL — ABNORMAL LOW (ref 4.22–5.81)
RDW: 15 % (ref 11.5–15.5)
WBC: 3.7 10*3/uL — ABNORMAL LOW (ref 4.0–10.5)
nRBC: 0 % (ref 0.0–0.2)

## 2022-04-25 LAB — URINALYSIS, MICROSCOPIC (REFLEX)

## 2022-04-25 LAB — COMPREHENSIVE METABOLIC PANEL
ALT: 16 U/L (ref 0–44)
AST: 26 U/L (ref 15–41)
Albumin: 3.7 g/dL (ref 3.5–5.0)
Alkaline Phosphatase: 61 U/L (ref 38–126)
Anion gap: 9 (ref 5–15)
BUN: 20 mg/dL (ref 8–23)
CO2: 21 mmol/L — ABNORMAL LOW (ref 22–32)
Calcium: 8.6 mg/dL — ABNORMAL LOW (ref 8.9–10.3)
Chloride: 101 mmol/L (ref 98–111)
Creatinine, Ser: 1.25 mg/dL — ABNORMAL HIGH (ref 0.61–1.24)
GFR, Estimated: >60 mL/min (ref >60)
Glucose, Bld: 181 mg/dL — ABNORMAL HIGH (ref 70–99)
Potassium: 3.9 mmol/L (ref 3.5–5.1)
Sodium: 131 mmol/L — ABNORMAL LOW (ref 135–145)
Total Bilirubin: 0.8 mg/dL (ref 0.3–1.2)
Total Protein: 7.3 g/dL (ref 6.5–8.1)

## 2022-04-25 LAB — URINALYSIS, ROUTINE W REFLEX MICROSCOPIC
Bilirubin Urine: NEGATIVE
Glucose, UA: >=500 mg/dL — AB
Hgb urine dipstick: NEGATIVE
Ketones, ur: NEGATIVE mg/dL
Leukocytes,Ua: NEGATIVE
Nitrite: NEGATIVE
Protein, ur: 30 mg/dL — AB
Specific Gravity, Urine: 1.015 (ref 1.005–1.030)
pH: 5.5 (ref 5.0–8.0)

## 2022-04-25 LAB — GLUCOSE, CAPILLARY: Glucose-Capillary: 149 mg/dL — ABNORMAL HIGH (ref 70–99)

## 2022-04-25 LAB — LACTIC ACID, PLASMA: Lactic Acid, Venous: 1.8 mmol/L (ref 0.5–1.9)

## 2022-04-25 MED ORDER — OXYCODONE-ACETAMINOPHEN 5-325 MG PO TABS: 1.0000 | ORAL_TABLET | Freq: Four times a day (QID) | ORAL | Status: DC | PRN

## 2022-04-25 MED ORDER — ATORVASTATIN CALCIUM 40 MG PO TABS
40.0000 mg | ORAL_TABLET | Freq: Every day | ORAL | Status: DC
  Administered 2022-04-25 – 2022-04-27 (×3): 40 mg via ORAL
  Filled 2022-04-25 (×3): qty 1

## 2022-04-25 MED ORDER — PIPERACILLIN-TAZOBACTAM 3.375 G IVPB
3.3750 g | Freq: Three times a day (TID) | INTRAVENOUS | Status: DC
  Administered 2022-04-25 – 2022-04-26 (×2): 3.375 g via INTRAVENOUS
  Filled 2022-04-25 (×3): qty 50

## 2022-04-25 MED ORDER — ICOSAPENT ETHYL 1 G PO CAPS
2.0000 g | ORAL_CAPSULE | Freq: Two times a day (BID) | ORAL | Status: DC
  Administered 2022-04-25 – 2022-04-28 (×6): 2 g via ORAL
  Filled 2022-04-25 (×6): qty 2

## 2022-04-25 MED ORDER — ASPIRIN 81 MG PO TBEC
81.0000 mg | DELAYED_RELEASE_TABLET | Freq: Every day | ORAL | Status: DC
  Administered 2022-04-26 – 2022-04-28 (×3): 81 mg via ORAL
  Filled 2022-04-25 (×4): qty 1

## 2022-04-25 MED ORDER — PREGABALIN 75 MG PO CAPS
75.0000 mg | ORAL_CAPSULE | Freq: Three times a day (TID) | ORAL | Status: DC
  Administered 2022-04-25 – 2022-04-28 (×8): 75 mg via ORAL
  Filled 2022-04-25 (×8): qty 1

## 2022-04-25 MED ORDER — OXYBUTYNIN CHLORIDE ER 5 MG PO TB24
10.0000 mg | ORAL_TABLET | Freq: Every day | ORAL | Status: DC
  Administered 2022-04-26 – 2022-04-28 (×3): 10 mg via ORAL
  Filled 2022-04-25 (×3): qty 2

## 2022-04-25 MED ORDER — SODIUM CHLORIDE 0.9 % IV SOLN: INTRAVENOUS | Status: DC | PRN

## 2022-04-25 MED ORDER — IOHEXOL 300 MG/ML  SOLN
100.0000 mL | Freq: Once | INTRAMUSCULAR | Status: AC | PRN
  Administered 2022-04-25: 100 mL via INTRAVENOUS

## 2022-04-25 MED ORDER — ACETAMINOPHEN 325 MG PO TABS
650.0000 mg | ORAL_TABLET | Freq: Four times a day (QID) | ORAL | Status: DC | PRN
  Administered 2022-04-25 – 2022-04-26 (×2): 650 mg via ORAL
  Filled 2022-04-25 (×2): qty 2

## 2022-04-25 MED ORDER — SODIUM CHLORIDE 0.9 % IV BOLUS
2000.0000 mL | Freq: Once | INTRAVENOUS | Status: AC
  Administered 2022-04-25: 2000 mL via INTRAVENOUS

## 2022-04-25 MED ORDER — BUPRENORPHINE 10 MCG/HR TD PTWK
2.0000 | MEDICATED_PATCH | TRANSDERMAL | Status: DC
  Administered 2022-04-28: 2 via TRANSDERMAL
  Filled 2022-04-25: qty 2

## 2022-04-25 MED ORDER — VANCOMYCIN HCL IN DEXTROSE 1-5 GM/200ML-% IV SOLN
1000.0000 mg | Freq: Once | INTRAVENOUS | Status: AC
  Administered 2022-04-25: 1000 mg via INTRAVENOUS
  Filled 2022-04-25: qty 200

## 2022-04-25 MED ORDER — FLUTICASONE PROPIONATE 50 MCG/ACT NA SUSP
1.0000 | Freq: Two times a day (BID) | NASAL | Status: DC
  Administered 2022-04-26 – 2022-04-27 (×2): 1 via NASAL
  Filled 2022-04-25: qty 16

## 2022-04-25 MED ORDER — VITAMIN D (ERGOCALCIFEROL) 1.25 MG (50000 UNIT) PO CAPS
50000.0000 [IU] | ORAL_CAPSULE | ORAL | Status: DC
  Administered 2022-04-27: 50000 [IU] via ORAL
  Filled 2022-04-25: qty 1

## 2022-04-25 MED ORDER — PANTOPRAZOLE SODIUM 40 MG PO TBEC
40.0000 mg | DELAYED_RELEASE_TABLET | Freq: Every day | ORAL | Status: DC
  Administered 2022-04-26 – 2022-04-28 (×3): 40 mg via ORAL
  Filled 2022-04-25 (×3): qty 1

## 2022-04-25 MED ORDER — VANCOMYCIN HCL 2000 MG/400ML IV SOLN
2000.0000 mg | INTRAVENOUS | Status: DC
  Filled 2022-04-25: qty 400

## 2022-04-25 MED ORDER — INSULIN ASPART 100 UNIT/ML IJ SOLN
0.0000 [IU] | Freq: Three times a day (TID) | INTRAMUSCULAR | Status: DC
  Administered 2022-04-26 – 2022-04-27 (×2): 1 [IU] via SUBCUTANEOUS
  Administered 2022-04-27: 3 [IU] via SUBCUTANEOUS

## 2022-04-25 MED ORDER — ENOXAPARIN SODIUM 80 MG/0.8ML IJ SOSY
80.0000 mg | PREFILLED_SYRINGE | INTRAMUSCULAR | Status: DC
  Administered 2022-04-25 – 2022-04-27 (×3): 80 mg via SUBCUTANEOUS
  Filled 2022-04-25 (×3): qty 0.8

## 2022-04-25 MED ORDER — BUPRENORPHINE 20 MCG/HR TD PTWK: 1.0000 | MEDICATED_PATCH | TRANSDERMAL | Status: DC

## 2022-04-25 MED ORDER — CALCIUM CARBONATE ANTACID 500 MG PO CHEW
1.0000 | CHEWABLE_TABLET | Freq: Once | ORAL | Status: AC
  Administered 2022-04-25: 200 mg via ORAL
  Filled 2022-04-25: qty 1

## 2022-04-25 MED ORDER — LACTATED RINGERS IV SOLN: INTRAVENOUS | Status: AC

## 2022-04-25 MED ORDER — ACETAMINOPHEN 325 MG PO TABS
650.0000 mg | ORAL_TABLET | Freq: Once | ORAL | Status: AC
  Administered 2022-04-25: 650 mg via ORAL
  Filled 2022-04-25: qty 2

## 2022-04-25 MED ORDER — PIPERACILLIN-TAZOBACTAM 3.375 G IVPB 30 MIN
3.3750 g | Freq: Once | INTRAVENOUS | Status: AC
  Administered 2022-04-25: 3.375 g via INTRAVENOUS
  Filled 2022-04-25: qty 50

## 2022-04-25 NOTE — Assessment & Plan Note (Signed)
BMI of 48 

## 2022-04-25 NOTE — ED Notes (Signed)
Patient transported to CT 

## 2022-04-25 NOTE — Assessment & Plan Note (Signed)
-  secondary to sepsis and dehydration -Continuous IV fluids -Hold all home antihypertensives

## 2022-04-25 NOTE — Assessment & Plan Note (Signed)
continue statin

## 2022-04-25 NOTE — Assessment & Plan Note (Signed)
Continue nighttime CPAP 

## 2022-04-25 NOTE — Assessment & Plan Note (Signed)
-  continue home opioids -follows with pain management in [facility] with [address]

## 2022-04-25 NOTE — ED Triage Notes (Signed)
Fever today , lethargic , reports confusion. Alert and oriented x4 . Hx HTN ,  Denies cough or congestion , no urinary symptoms yet Hx UTIs and sepsis.

## 2022-04-25 NOTE — H&P (Signed)
History and Physical    Patient: [name] JYN:[id number] DOB: 31-Aug-1955 DOA: 04/25/2022 DOS: the patient was seen and examined on 04/25/2022 PCP: Eunice Blase, PA-C  Patient coming from:  [facility] ED  Chief Complaint:  Chief Complaint  Patient presents with   Fever   HPI: [name] is a 67 y.o. male with medical history significant of HTN, HLD, T2MD, paroxysmal atrial fibrillation on aspirin only, aortic stenosis s/p TAVR, chronic pain, prostate cancer in remission who presents with concerns of feeling out of focus.   Symptoms started yesterday. He felt things were going out of focus similar to a camera. Clock on the wall would go in and out of focus. He was playing with grand-children in the yard and they suddenly went out of focus. He was pulling weeds and when he stood up he felt dizziness and things went out of focus again. No spinning sensation. No headache. No neck pain or rigidity. He wears glasses and gets eye exam yearly. Tried to play an online math game that he has played for years but yesterday suddenly did not know what to do in the game.  Normally ambulates with cane due to chronic hip and thigh pain. Lower extremities felt more weak yesterday. Wife check temperature this morning at 1059F and decided to go to ED.  Denies any cough, runny nose. No nausea, vomiting or diarrhea.  No dysuria.  No penile discharge or pain. No sick contact. No recent travel or travelers visiting home. No tick or insect bite. [place] not [place] out [place] farm. He just has a koi pond that he cleans at times with his hands but no wounds. Has small dog but no other pets or farm animals.  Has tattoos but are not new. No recent surgery. No dental work or pain.   No tobacco, alcohol or illicit drug use.    In the ED, he was febrile to 101.59F, hypotensive to 70/40.  Heart rate of 92.  Tachypneic with RR 23.  Leukopenia of 3.7. Normocytic anemia hgb of 10.5 with baseline around 11-12. Lactate within normal  limits.   Mild hyponatremia of 131. K of 3.9. Creatinine is 1.25 with a prior of 1.04 a few months ago. Mild hypocalcemia of 8.6. Albumin normal at 3.7. CBG of 181. LFTs are negative.  Negative Flu/COVID/RSV, UA negative. CXR is negative.   CT chest, abdomen and pelvis without acute findings. Few small layering gallstones within the gallbladder.  Right colonic diverticulosis.  No active diverticulitis.  He was started empirically on IV vancomycin and Zosyn and transferred to [facility] for further workup.   Review of Systems: As mentioned in the history of present illness. All other systems reviewed and are negative. Past Medical History:  Diagnosis Date   Chronic back pain    HNP/stenosis   Degenerative disk disease    Depression    Difficult intubation    dx 05/14/07 [facility]); has anterior larynx, small mouth; glidescope #3 used in 2012 ([facility])   Enlarged prostate    slightly   History of colon polyps    History of kidney stones    Hyperlipidemia    takes Atorvastatin daily   Hypertension    takes Maxzide and Lisinopril daily as well as Bystolic   Joint pain    Joint swelling    Myocardial infarct 1999   OSA (obstructive sleep apnea) 04/25/2022   Vitamin D deficiency    takes Vit D on Weds   Past Surgical History:  Procedure  Laterality Date   ACHILLES TENDON SURGERY Left    reattached    BACK SURGERY     x 2   CARPAL TUNNEL RELEASE Right 2006   had nerve impingement   COLONOSCOPY     ELBOW SURGERY Bilateral    ESOPHAGOGASTRODUODENOSCOPY     JOINT REPLACEMENT Bilateral 2012   (R) 03/2000: (L) 10/12   LAMINECTOMY  09/19/2013   L3  L4    DR CRAM    SHOULDER ARTHROSCOPY WITH ROTATOR CUFF REPAIR Right 01/2012   TONSILLECTOMY     Social History:  reports that he quit smoking about 24 years ago. His smoking use included cigarettes. He has a 7.00 pack-year smoking history. He has never used smokeless tobacco. He reports that he does not drink alcohol and does not use  drugs.  No Known Allergies  History reviewed. No pertinent family history.  Prior to Admission medications   Medication Sig Start Date End Date Taking? Authorizing Provider  aspirin EC 81 MG tablet Take 81 mg by mouth daily. Swallow whole.   Yes [provider]  atorvastatin (LIPITOR) 40 MG tablet Take 40 mg by mouth at bedtime.   Yes [provider]  buprenorphine (BUTRANS) 20 MCG/HR PTWK Place 1 patch onto the skin every Monday.   Yes [provider]  fluticasone (FLONASE) 50 MCG/ACT nasal spray Place 1 spray into both nostrils in the morning and at bedtime.   Yes [provider]  lisinopril (ZESTRIL) 10 MG tablet Take 10 mg by mouth daily.   Yes [provider]  metoprolol tartrate (LOPRESSOR) 25 MG tablet Take 25 mg by mouth in the morning and at bedtime.   Yes [provider]  omeprazole (PRILOSEC) 40 MG capsule Take 40 mg by mouth daily before breakfast.   Yes [provider]  oxybutynin (DITROPAN-XL) 10 MG 24 hr tablet Take 10 mg by mouth in the morning.   Yes [provider]  oxyCODONE-acetaminophen (PERCOCET/ROXICET) 5-325 MG tablet Take 1 tablet by mouth daily as needed (for pain).   Yes [provider]  pregabalin (LYRICA) 75 MG capsule Take 75 mg by mouth in the morning, at noon, and at bedtime.   Yes [provider]  silver sulfADIAZINE (SILVADENE) 1 % cream Apply 1 Application topically daily as needed (for wound care).   Yes [provider]  triamterene-hydrochlorothiazide (MAXZIDE) 75-50 MG per tablet Take 1 tablet by mouth daily.   Yes [provider]  VASCEPA 1 g capsule Take 2 g by mouth in the morning and at bedtime.   Yes [provider]  Vitamin D, Ergocalciferol, (DRISDOL) 50000 UNITS CAPS capsule Take 50,000 Units by mouth every Sunday.   Yes [provider]  XIGDUO XR 05-998 MG TB24 Take 2 tablets by mouth in the morning.   Yes [provider]  aspirin 325 MG tablet Take 1 tablet (325 mg total) by mouth daily. Patient not taking: Reported on 04/25/2022 09/27/13   Lisbeth Renshaw, MD  diclofenac Sodium (VOLTAREN) 1 % GEL Apply 4 g topically 4 (four) times daily. Patient not taking: Reported on 04/25/2022 10/17/21   Melene Plan, DO  etodolac (LODINE) 400 MG tablet Take 400 mg by mouth 2 (two) times daily. Patient not taking: Reported on 04/25/2022    [provider]  OxyCODONE (OXYCONTIN) 20 mg T12A 12 hr tablet Take one tablet by mouth at bedtime. Do not cut/crush/chew Patient not taking: Reported on 04/25/2022 09/28/13   Oneal Grout, MD  Oxycodone HCl 10 MG TABS Take one to two tablets by mouth three times daily as needed for pain Patient not taking: Reported on 04/25/2022 09/26/13   Kermit Balo, DO    Physical Exam: Vitals:   04/25/22 1500 04/25/22 1515 04/25/22 1545 04/25/22 1821  BP: (!) 94/49 97/60 (!) 100/50 (!) 121/57  Pulse: 71 68 67 85  Resp: (!) 21 15 14 19   Temp:    (!) 101.3 F (38.5 C)  TempSrc:    Oral  SpO2: 99% 100% 96% 100%  Weight:      Height:       Constitutional: NAD, calm, comfortable, nontoxic morbidly obese male with facial flushing laying in bed Eyes: lids and conjunctivae normal ENMT: Mucous membranes are moist. Posterior pharynx clear of any exudate or lesions.poor dentition with artificial dental work. Neck: normal, supple, bilateral anterior cervical lymphadenopathy. Respiratory: clear to auscultation bilaterally, no wheezing, no crackles. Normal respiratory effort. No accessory muscle use.  Cardiovascular: Regular rate and rhythm, no murmurs / rubs / gallops. No extremity edema.   Abdomen: no tenderness, no masses palpated. No hepatosplenomegaly. Bowel sounds positive.  Musculoskeletal: no clubbing / cyanosis. No joint deformity upper and lower extremities. Good ROM, no contractures. Normal muscle tone.  Skin: no rashes, lesions, ulcers. No induration.  No skin  breakdown.  Has old tattoos on upper extremity and upper thoracic region. Neurologic: CN 2-12 grossly intact. Strength 5/5 in all 4.  No nuchal rigidity. Psychiatric: Normal judgment and insight. Alert and oriented x 3. Normal mood. Data Reviewed:  See HPI  Assessment and Plan: * Sepsis Unknown origin -Presented with fever up to 101.83F, tachycardia, leukopenia and hypotension. However unable to location source. Only reports symptoms of vision changes and difficulty with concentration.  -Benign physical exam without findings. No wounds or rash. No nuchal rigidity. Low concern for meningitis. Has spinal cord stimulator that is incompatible with MRI but does not feel he needs any brain imaging. His symptoms most likely non-specific from fever and sepsis. No recent surgery or dental work or IV drug use to have any hematogenous spread of infection.  -CXR negative. UA negative. CT Chest/abd/pelvis negative. Negative Flu/COVID/RSV PCR negative.  -no recent travel or calf pain. He is active at home. Low concerns for pulmonary embolism.  -has hx of prostate cancer but in remission and had PSA of 0 checked a few months ago -will add on full respiratory viral panel -will continue IV Vancomycin and Zosyn for now pending blood culture   Hypotension -secondary to sepsis and dehydration -Continuous IV fluids -Hold all home antihypertensives  Obesity, Class III, BMI 40-49.9 (morbid obesity) -BMI of 48  Type 2 diabetes mellitus -Place on sensitive SSI  OSA (obstructive sleep apnea) - Continue nighttime CPAP  Paroxysmal atrial fibrillation - Not on anticoagulation.  Continue daily aspirin.  Rate is controlled.  Hypocalcemia - Mild hypocalcemia of 8.6.  Will supplement with oral calcium.  Hyponatremia - Mild with sodium of 131 - Keep on continuous IV fluid and follow trend in the morning  AKI (acute kidney injury) - Creatinine is elevated to 1.25 from prior of 1.04 - Keep on continuous IV  fluids - Avoid nephrotoxic agent  Chronic pain -continue home opioids -follows with pain management in [facility] with [facility]  Hyperlipidemia -continue statin      Advance Care Planning:   Code Status: Full Code   Consults: none  Family Communication: wife at bedside  Severity of Illness: The appropriate patient status for  this patient is INPATIENT. Inpatient status is judged to be reasonable and necessary in order to provide the required intensity of service to ensure the patient's safety. The patient's presenting symptoms, physical exam findings, and initial radiographic and laboratory data in the context of their chronic comorbidities is felt to place them at high risk for further clinical deterioration. Furthermore, it is not anticipated that the patient will be medically stable for discharge from the hospital within 2 midnights of admission.   * I certify that at the point of admission it is my clinical judgment that the patient will require inpatient hospital care spanning beyond 2 midnights from the point of admission due to high intensity of service, high risk for further deterioration and high frequency of surveillance required.*  Author: Anselm Jungling, DO 04/25/2022 8:44 PM  For on call review [email or web address].

## 2022-04-25 NOTE — Assessment & Plan Note (Signed)
-   Mild hypocalcemia of 8.6.  Will supplement with oral calcium.

## 2022-04-25 NOTE — Assessment & Plan Note (Signed)
-   Mild with sodium of 131 - Keep on continuous IV fluid and follow trend in the morning

## 2022-04-25 NOTE — Progress Notes (Signed)
Pharmacy Antibiotic Note  [name] is a 67 y.o. male admitted on 04/25/2022 with sepsis.  Pharmacy has been consulted for vancomycin and zosyn dosing. Pt is febrile at 100.9 and WBC is slightly low at 3.7. SCr is above baseline at 1.25. Lactic acid is <2.   Plan: Vancomycin 2g IV Q24H  Zosyn 3.375g IV Q8H (4 hr inf) F/u renal fxn, C&S, clinical status and peak/trough at SS  Height:  (180.3 cm) Weight: (!) 156.5 kg (345 lb) IBW/kg (Calculated) : 75.3  Temp (24hrs), Avg:99.6 F (37.6 C), Min:98.9 F (37.2 C), Max:100.9 F (38.3 C)  Recent Labs  Lab 04/25/22 1253  WBC 3.7*  CREATININE 1.25*  LATICACIDVEN 1.8    Estimated Creatinine Clearance: 88.6 mL/min (A) (by C-G formula based on SCr of 1.25 mg/dL (H)).    No Known Allergies  Antimicrobials this admission: Vanc 4/19>> Zosyn 4/19>>  Dose adjustments this admission: N/A  Microbiology results: Pending  Thank you for allowing pharmacy to be a part of this patient's care.  Daylan Boggess, Drake Leach 04/25/2022 2:34 PM

## 2022-04-25 NOTE — Assessment & Plan Note (Addendum)
Unknown origin -Presented with fever up to 101.11F, tachycardia, leukopenia and hypotension. However unable to location source. Only reports symptoms of vision changes and difficulty with concentration.  -Benign physical exam without findings. No wounds or rash. No nuchal rigidity. Low concern for meningitis. Has spinal cord stimulator that is incompatible with MRI but does not feel he needs any brain imaging. His symptoms most likely non-specific from fever and sepsis. No recent surgery or dental work or IV drug use to have any hematogenous spread of infection.  -CXR negative. UA negative. CT Chest/abd/pelvis negative. Negative Flu/COVID/RSV PCR negative.  -no recent travel or calf pain. He is active at home. Low concerns for pulmonary embolism.  -has hx of prostate cancer but in remission and had PSA of 0 checked a few months ago -will add on full respiratory viral panel -will continue IV Vancomycin and Zosyn for now pending blood culture

## 2022-04-25 NOTE — Assessment & Plan Note (Signed)
-   Creatinine is elevated to 1.25 from prior of 1.04 - Keep on continuous IV fluids - Avoid nephrotoxic agent

## 2022-04-25 NOTE — Assessment & Plan Note (Signed)
   Managed with sensitive sliding scale 

## 2022-04-25 NOTE — ED Notes (Signed)
Blood culture #1 Right forearm

## 2022-04-25 NOTE — Assessment & Plan Note (Signed)
-   Not on anticoagulation.  Continue daily aspirin.  Rate is controlled.

## 2022-04-25 NOTE — ED Notes (Signed)
Called [facility] for transport talked to [facility] at 3:50

## 2022-04-25 NOTE — ED Provider Notes (Signed)
EMERGENCY DEPARTMENT AT [facility] Provider Note   CSN: [id number] Arrival date & time: 04/25/22  1203     History  Chief Complaint  Patient presents with   Fever    [name] is a 67 y.o. male presenting to the ED complaining of lightheadedness, confusion, onset yesterday.  His wife at the bedside confirms the symptoms.  Patient reports has a history of UTIs and urosepsis in the past, is not actively having dysuria but did not have symptoms in the past.  He denies coughing, congestion, headaches.  He reports a fever at home today.  HPI     Home Medications Prior to Admission medications   Medication Sig Start Date End Date Taking? Authorizing Provider  aspirin 325 MG tablet Take 1 tablet (325 mg total) by mouth daily. 09/27/13   Lisbeth Renshaw, MD  atorvastatin (LIPITOR) 40 MG tablet Take 40 mg by mouth daily.    [provider]  Choline Fenofibrate (TRILIPIX) 135 MG capsule Take 135 mg by mouth daily.    [provider]  diclofenac Sodium (VOLTAREN) 1 % GEL Apply 4 g topically 4 (four) times daily. 10/17/21   Melene Plan, DO  etodolac (LODINE) 400 MG tablet Take 400 mg by mouth 2 (two) times daily.    [provider]  lisinopril (PRINIVIL,ZESTRIL) 20 MG tablet Take 20 mg by mouth daily.    [provider]  nebivolol (BYSTOLIC) 5 MG tablet Take 5 mg by mouth daily.    [provider]  OxyCODONE (OXYCONTIN) 20 mg T12A 12 hr tablet Take one tablet by mouth at bedtime. Do not cut/crush/chew 09/28/13   Oneal Grout, MD  Oxycodone HCl 10 MG TABS Take one to two tablets by mouth three times daily as needed for pain 09/26/13   Reed, Tiffany L, DO  Oxycodone HCl 20 MG TABS Take 10-20 mg by mouth 3 (three) times daily as needed (severe pain).     [provider]  triamterene-hydrochlorothiazide (MAXZIDE) 75-50 MG per tablet Take 1 tablet by mouth daily.    [provider]  Vitamin D, Ergocalciferol,  (DRISDOL) 50000 UNITS CAPS capsule Take 50,000 Units by mouth every 7 (seven) days. Takes on wednesday    [provider]      Allergies    Patient has no known allergies.    Review of Systems   Review of Systems  Physical Exam Updated Vital Signs BP (!) 100/50   Pulse 67   Temp 98.9 F (37.2 C) (Oral)   Resp 14   Ht  (1.803 m)   Wt (!) 156.5 kg   SpO2 96%   BMI 48.12 kg/m  Physical Exam Constitutional:      General: He is not in acute distress. HENT:     Head: Normocephalic and atraumatic.  Eyes:     Conjunctiva/sclera: Conjunctivae normal.     Pupils: Pupils are equal, round, and reactive to light.  Cardiovascular:     Rate and Rhythm: Normal rate and regular rhythm.  Pulmonary:     Effort: Pulmonary effort is normal. No respiratory distress.  Abdominal:     General: There is no distension.     Tenderness: There is no abdominal tenderness.  Skin:    General: Skin is warm and dry.  Neurological:     General: No focal deficit present.     Mental Status: He is alert. Mental status is at baseline.  Psychiatric:  Mood and Affect: Mood normal.        Behavior: Behavior normal.     ED Results / Procedures / Treatments   Labs (all labs ordered are listed, but only abnormal results are displayed) Labs Reviewed  COMPREHENSIVE METABOLIC PANEL - Abnormal; Notable for the following components:      Result Value   Sodium 131 (*)    CO2 21 (*)    Glucose, Bld 181 (*)    Creatinine, Ser 1.25 (*)    Calcium 8.6 (*)    All other components within normal limits  CBC WITH DIFFERENTIAL/PLATELET - Abnormal; Notable for the following components:   WBC 3.7 (*)    RBC 3.87 (*)    Hemoglobin 10.5 (*)    HCT 32.0 (*)    Lymphs Abs 0.5 (*)    All other components within normal limits  URINALYSIS, ROUTINE W REFLEX MICROSCOPIC - Abnormal; Notable for the following components:   Glucose, UA >=500 (*)    Protein, ur 30 (*)    All other components within  normal limits  RESP PANEL BY RT-PCR (RSV, FLU A&B, COVID)  [id number]  CULTURE, BLOOD (ROUTINE X 2)  CULTURE, BLOOD (ROUTINE X 2)  LACTIC ACID, PLASMA    EKG EKG Interpretation  Date/Time:  Friday April 25 2022 12:24:51 EDT Ventricular Rate:  92 PR Interval:  199 QRS Duration: 93 QT Interval:  361 QTC Calculation: 447 R Axis:   62 Text Interpretation: Sinus rhythm Confirmed by Alvester Chou [phone]) on 04/25/2022 2:18:55 PM  Radiology DG Chest 2 View  Result Date: 04/25/2022 CLINICAL DATA:  Fever EXAM: CHEST - 2 VIEW COMPARISON:  Radiograph 09/19/2013 FINDINGS: Unchanged cardiomediastinal silhouette with prior TAVR. There is no new focal airspace consolidation. There is no large effusion or evidence of pneumothorax. Spinal cord stimulator lead overlies the mid chest. Partially visualized lumbar fusion hardware. No acute osseous abnormality. Thoracic spondylosis. IMPRESSION: No evidence of acute cardiopulmonary disease. Electronically Signed   By: Caprice Renshaw M.D.   On: 04/25/2022 12:59    Procedures .Critical Care  Performed by: Terald Sleeper, MD Authorized by: Terald Sleeper, MD   Critical care provider statement:    Critical care time (minutes):  45   Critical care time was exclusive of:  Separately billable procedures and treating other patients   Critical care was necessary to treat or prevent imminent or life-threatening deterioration of the following conditions:  Sepsis   Critical care was time spent personally by me on the following activities:  Ordering and performing treatments and interventions, ordering and review of laboratory studies, ordering and review of radiographic studies, pulse oximetry, review of old charts, examination of patient and evaluation of patient's response to treatment     Medications Ordered in ED Medications  vancomycin (VANCOCIN) IVPB 1000 mg/200 mL premix (1,000 mg Intravenous New Bag/Given 04/25/22 1508)    And  vancomycin (VANCOCIN)  IVPB 1000 mg/200 mL premix (has no administration in time range)  vancomycin (VANCOREADY) IVPB 2000 mg/400 mL (has no administration in time range)  piperacillin-tazobactam (ZOSYN) IVPB 3.375 g (has no administration in time range)  0.9 %  sodium chloride infusion ( Intravenous Rate/Dose Change 04/25/22 1526)  acetaminophen (TYLENOL) tablet 650 mg (650 mg Oral Given 04/25/22 1259)  sodium chloride 0.9 % bolus 2,000 mL (0 mLs Intravenous Stopped 04/25/22 1548)  piperacillin-tazobactam (ZOSYN) IVPB 3.375 g (0 g Intravenous Stopped 04/25/22 1520)  iohexol (OMNIPAQUE) 300 MG/ML solution 100 mL (100 mLs Intravenous Contrast Given  04/25/22 1645)    ED Course/ Medical Decision Making/ A&P Clinical Course as of 04/25/22 1657  Fri Apr 25, 2022  1459 Admitted to hospitalist - monitoring BP with MAP > 65 currently, now getting fluid boluses.  My reassessment acute has been mentating well, no lightheadedness, no acute encephalopathy.  Will plan for CT imaging for sepsis workup as there is no clear cause at this time. [MT]    Clinical Course User Index [MT] Celestial Barnfield, Kermit Balo, MD                             Medical Decision Making Amount and/or Complexity of Data Reviewed Labs: ordered. Radiology: ordered.  Risk OTC drugs. Prescription drug management. Decision regarding hospitalization.   This patient presents to the ED with concern for fever, confusion. This involves an extensive number of treatment options, and is a complaint that carries with it a high risk of complications and morbidity.  The differential diagnosis includes sepsis vs viral illness vs metabolic encephalopathy vs other  Additional history obtained from patient's wife at bedside  I ordered and personally interpreted labs.  The pertinent results include:  lactate wnl, WBC 3.7, covid flu engative, UA without sign of infection  I ordered imaging studies including xray chest I independently visualized and interpreted imaging which  showed no focal infiltrate I agree with the radiologist interpretation  The patient was maintained on a cardiac monitor.  I personally viewed and interpreted the cardiac monitored which showed an underlying rhythm of: NSR  Per my interpretation the patient's ECG shows no acute ischemic findings  I ordered medication including IV fluid bolus per ideal body weight, BS antibiotics for sepsis with unclear source  I have reviewed the patients home medicines and have made adjustments as needed  Test Considered: doubt meningitis, no emergent indication for LP at this time  After the interventions noted above, I reevaluated the patient and found that they have: stayed the same  Mentating well in ED, some lightheadedness with movement, BP soft but stable with MAP > 65 mmg at the time of admission.  Pending CT imaging for fever workup at the time of medical admission  Dispostion:  After consideration of the diagnostic results and the patients response to treatment, I feel that the patent would benefit from medical admission.         Final Clinical Impression(s) / ED Diagnoses Final diagnoses:  Sepsis, due to unspecified organism, unspecified whether acute organ dysfunction present    Rx / DC Orders ED Discharge Orders     None         Karliah Kowalchuk, Kermit Balo, MD 04/25/22 1657

## 2022-04-26 DIAGNOSIS — G894 Chronic pain syndrome: Secondary | ICD-10-CM | POA: Diagnosis not present

## 2022-04-26 DIAGNOSIS — I9589 Other hypotension: Secondary | ICD-10-CM | POA: Diagnosis not present

## 2022-04-26 DIAGNOSIS — R6510 Systemic inflammatory response syndrome (SIRS) of non-infectious origin without acute organ dysfunction: Secondary | ICD-10-CM | POA: Diagnosis not present

## 2022-04-26 DIAGNOSIS — N179 Acute kidney failure, unspecified: Secondary | ICD-10-CM | POA: Diagnosis not present

## 2022-04-26 LAB — CBC
HCT: 34.4 % — ABNORMAL LOW (ref 39.0–52.0)
Hemoglobin: 10.4 g/dL — ABNORMAL LOW (ref 13.0–17.0)
MCH: 26.8 pg (ref 26.0–34.0)
MCHC: 30.2 g/dL (ref 30.0–36.0)
MCV: 88.7 fL (ref 80.0–100.0)
Platelets: 136 10*3/uL — ABNORMAL LOW (ref 150–400)
RBC: 3.88 MIL/uL — ABNORMAL LOW (ref 4.22–5.81)
RDW: 15.2 % (ref 11.5–15.5)
WBC: 4.5 10*3/uL (ref 4.0–10.5)
nRBC: 0 % (ref 0.0–0.2)

## 2022-04-26 LAB — GLUCOSE, CAPILLARY
Glucose-Capillary: 134 mg/dL — ABNORMAL HIGH (ref 70–99)
Glucose-Capillary: 115 mg/dL — ABNORMAL HIGH (ref 70–99)
Glucose-Capillary: 159 mg/dL — ABNORMAL HIGH (ref 70–99)

## 2022-04-26 LAB — RESPIRATORY PANEL BY PCR

## 2022-04-26 LAB — BASIC METABOLIC PANEL
Anion gap: 12 (ref 5–15)
BUN: 20 mg/dL (ref 8–23)
CO2: 21 mmol/L — ABNORMAL LOW (ref 22–32)
Calcium: 8.6 mg/dL — ABNORMAL LOW (ref 8.9–10.3)
Chloride: 102 mmol/L (ref 98–111)
Creatinine, Ser: 1.31 mg/dL — ABNORMAL HIGH (ref 0.61–1.24)
GFR, Estimated: >60 mL/min (ref >60)
Glucose, Bld: 102 mg/dL — ABNORMAL HIGH (ref 70–99)
Potassium: 4.1 mmol/L (ref 3.5–5.1)
Sodium: 135 mmol/L (ref 135–145)

## 2022-04-26 LAB — HIV ANTIBODY (ROUTINE TESTING W REFLEX): HIV Screen 4th Generation wRfx: NONREACTIVE

## 2022-04-26 LAB — HEMOGLOBIN A1C
Hgb A1c MFr Bld: 6.2 % — ABNORMAL HIGH (ref 4.8–5.6)
Mean Plasma Glucose: 131.24 mg/dL

## 2022-04-26 MED ORDER — SODIUM CHLORIDE 0.9 % IV SOLN
100.0000 mg | Freq: Two times a day (BID) | INTRAVENOUS | Status: DC
  Administered 2022-04-26: 100 mg via INTRAVENOUS
  Filled 2022-04-26 (×4): qty 100

## 2022-04-26 MED ORDER — SODIUM CHLORIDE 0.9 % IV SOLN
100.0000 mg | Freq: Two times a day (BID) | INTRAVENOUS | Status: DC
  Administered 2022-04-27 – 2022-04-28 (×2): 100 mg via INTRAVENOUS
  Filled 2022-04-26 (×5): qty 100

## 2022-04-26 NOTE — Progress Notes (Signed)
  Transition of Care [facility]) Screening Note   Patient Details  Name: Sylvain Hasten Date of Birth: 1955/10/19   Transition of Care [facility]) CM/SW Contact:    Adrian Prows, RN Phone Number: 04/26/2022, 3:37 PM    Transition of Care [facility]) has reviewed patient and no TOC needs have been identified at this time. We will continue to monitor patient advancement through interdisciplinary progression rounds. If new patient transition needs arise, please place a TOC consult.

## 2022-04-26 NOTE — Progress Notes (Signed)
PROGRESS NOTE    [name]  NWG:[id number] DOB: 1955-05-16 DOA: 04/25/2022 PCP: Eunice Blase, PA-C   Brief Narrative:  [name] is a 67 y.o. male with medical history significant of HTN, HLD, T2MD, paroxysmal atrial fibrillation on aspirin only, aortic stenosis s/p TAVR, chronic pain, prostate cancer in remission who presents with confusion/encephalopathy as well as episodes of "fogginess"  Patient presents from home with atypical mental status changes, found to be quite febrile in the ED concerning for infection, placed on broad-spectrum antibiotics and admitted per hospitalist team.   Assessment & Plan:   Principal Problem:   Sepsis Active Problems:   Hypotension   Hyperlipidemia   Chronic pain   AKI (acute kidney injury)   Hyponatremia   Hypocalcemia   Paroxysmal atrial fibrillation   OSA (obstructive sleep apnea)   Type 2 diabetes mellitus   Obesity, Class III, BMI 40-49.9 (morbid obesity)  SIRS, does not meet criteria for sepsis given no source, POA Rule out arthropod borne disease -Patient presents with recurrent fevers, 103 again overnight despite negative imaging and cultures.  Patient has no clinical symptoms of infection other than transient episode of confusion prior to admission. -Sideline discussion with ID, given mild leukopenia and thrombocytopenia could consider atypical arthropod borne disease given patient is outside gardening a majority of the day this is not unreasonable -Discontinue broad-spectrum antibiotics (vanc/zosyn) and start doxycycline -Ehrlichiosis, [place] spotted fever and Lyme's disease titers pending - likely a send out, do not expect results prior to clinical improvement. -Per wife patient interacts with birds/birdfeeders, stray cats as well as compost and mulch on a daily basis as well as a quite pond in the yard with stagnant water. -No rashes this, clinically appears well, denies recent travel, sick contacts -Patient states the  only traumatic event over the past few weeks was a single mechanical fall 2 weeks ago without loss of consciousness.   Transient episodes of encephalopathy, unclear etiology -Patient reports multiple transient episodes of "lost time/confusion"on occasion, wife has not seen these episodes. -Wife indicates patient did have a single episode of acute transient confusion and slurred speech at dinner few weeks ago as well as transient episode of slurred speech just prior to admission.  These both resolved quickly but patient's mental status did not return back to normal immediately. Patient has no history personal or family of seizure or seizure-like activity. -If all other workup is negative as above it may be prudent to have patient follow-up outpatient with neurology for further discussion about possible absence seizure's although again this would be exceedingly unlikely given patient's history.  Discussed with wife the likelihood of prohibiting husband from driving at least in the interim until these episodes resolved.  Hypotension -Secondary to above, dehydration -Resolved with IV fluid, continue to hold hypertensive medications   Obesity, Class III, BMI 40-49.9 (morbid obesity) -BMI of 48   Type 2 diabetes mellitus -Place on sensitive SSI   OSA (obstructive sleep apnea) - Continue nighttime CPAP   Paroxysmal atrial fibrillation - Not on anticoagulation.  Continue daily aspirin.  Rate is controlled.   Hypocalcemia - Mild hypocalcemia of 8.6.  Will supplement with oral calcium.   Hyponatremia - Mild with sodium of 131 - Keep on continuous IV fluid and follow trend in the morning   AKI (acute kidney injury) - Creatinine is elevated to 1.25 from prior of 1.04 - Keep on continuous IV fluids - Avoid nephrotoxic agent   Chronic pain -continue home opioids -follows with pain  management in [facility] with [facility]   Hyperlipidemia -continue statin  DVT prophylaxis: Lovenox Code  Status: Full Family Communication: Wife updated over the phone  Status is: Inpatient  Dispo: The patient is from: Home              Anticipated d/c is to: Home              Anticipated d/c date is: 24 to 48 hours pending clinical course and findings as above              Patient currently not medically stable for discharge  Consultants:  None  Procedures:  None  Antimicrobials:  Doxycycline  Subjective: No acute issues or events overnight, patient reports he is back to baseline today denies nausea vomiting diarrhea constipation any fevers chills chest pain or shortness of breath  Objective: Vitals:   04/25/22 2306 04/26/22 0346 04/26/22 0640 04/26/22 0644  BP: 129/65 (!) 129/53 (!) 158/41   Pulse: 96 71 90   Resp:      Temp: (!) 103.2 F (39.6 C) 98.9 F (37.2 C) (!) 103.3 F (39.6 C) (!) 100.8 F (38.2 C)  TempSrc: Oral Oral Oral Oral  SpO2: 94% 94% 94%   Weight:      Height:        Intake/Output Summary (Last 24 hours) at 04/26/2022 0811 Last data filed at 04/25/2022 2324 Gross per 24 hour  Intake 2340.1 ml  Output 900 ml  Net 1440.1 ml   Filed Weights   04/25/22 1213 04/25/22 1315  Weight: 111.6 kg (!) 156.5 kg    Examination:  General:  Pleasantly resting in bed, No acute distress.  ANO x 4 HEENT:  Normocephalic atraumatic.  Sclerae nonicteric, noninjected.  Extraocular movements intact bilaterally. Neck:  Without mass or deformity.  Trachea is midline. Lungs:  Clear to auscultate bilaterally without rhonchi, wheeze, or rales. Heart:  Regular rate and rhythm.  Without murmurs, rubs, or gallops. Abdomen:  Soft, nontender, nondistended.  Without guarding or rebound. Extremities: Without cyanosis, clubbing, edema, or obvious deformity. Vascular:  Dorsalis pedis and posterior tibial pulses palpable bilaterally. Skin:  Warm and dry, no erythema, no ulcerations.   Data Reviewed: I have personally reviewed following labs and imaging studies  CBC: Recent  Labs  Lab 04/25/22 1253 04/26/22 0616  WBC 3.7* 4.5  NEUTROABS 2.4  --   HGB 10.5* 10.4*  HCT 32.0* 34.4*  MCV 82.7 88.7  PLT 164 136*   Basic Metabolic Panel: Recent Labs  Lab 04/25/22 1253  NA 131*  K 3.9  CL 101  CO2 21*  GLUCOSE 181*  BUN 20  CREATININE 1.25*  CALCIUM 8.6*   GFR: Estimated Creatinine Clearance: 88.6 mL/min (A) (by C-G formula based on SCr of 1.25 mg/dL (H)). Liver Function Tests: Recent Labs  Lab 04/25/22 1253  AST 26  ALT 16  ALKPHOS 61  BILITOT 0.8  PROT 7.3  ALBUMIN 3.7   No results for input(s): "LIPASE", "AMYLASE" in the last 168 hours. No results for input(s): "AMMONIA" in the last 168 hours. Coagulation Profile: No results for input(s): "INR", "PROTIME" in the last 168 hours. Cardiac Enzymes: No results for input(s): "CKTOTAL", "CKMB", "CKMBINDEX", "TROPONINI" in the last 168 hours. BNP (last 3 results) No results for input(s): "PROBNP" in the last 8760 hours. HbA1C: Recent Labs    04/25/22 2059  HGBA1C 6.2*   CBG: Recent Labs  Lab 04/25/22 2308 04/26/22 0750  GLUCAP 149* 134*   Lipid Profile:  No results for input(s): "CHOL", "HDL", "LDLCALC", "TRIG", "CHOLHDL", "LDLDIRECT" in the last 72 hours. Thyroid Function Tests: No results for input(s): "TSH", "T4TOTAL", "FREET4", "T3FREE", "THYROIDAB" in the last 72 hours. Anemia Panel: No results for input(s): "VITAMINB12", "FOLATE", "FERRITIN", "TIBC", "IRON", "RETICCTPCT" in the last 72 hours. Sepsis Labs: Recent Labs  Lab 04/25/22 1253  LATICACIDVEN 1.8    Recent Results (from the past 240 hour(s))  Resp panel by RT-PCR (RSV, Flu A&B, Covid) Anterior Nasal Swab     Status: None   Collection Time: 04/25/22 12:53 PM   Specimen: Anterior Nasal Swab  Result Value Ref Range Status   SARS Coronavirus 2 by RT PCR NEGATIVE NEGATIVE Final    Comment: (NOTE) SARS-CoV-2 target nucleic acids are NOT DETECTED.  The SARS-CoV-2 RNA is generally detectable in upper  respiratory specimens during the acute phase of infection. The lowest concentration of SARS-CoV-2 viral copies this assay can detect is 138 copies/mL. A negative result does not preclude SARS-Cov-2 infection and should not be used as the sole basis for treatment or other patient management decisions. A negative result may occur with  improper specimen collection/handling, submission of specimen other than nasopharyngeal swab, presence of viral mutation(s) within the areas targeted by this assay, and inadequate number of viral copies(<138 copies/mL). A negative result must be combined with clinical observations, patient history, and epidemiological information. The expected result is Negative.  Fact Sheet for Patients:  BloggerCourse.com  Fact Sheet for Healthcare Providers:  SeriousBroker.it  This test is no t yet approved or cleared by the [place] FDA and  has been authorized for detection and/or diagnosis of SARS-CoV-2 by FDA under an Emergency Use Authorization (EUA). This EUA will remain  in effect (meaning this test can be used) for the duration of the COVID-19 declaration under Section 564(b)(1) of the Act, 21 U.S.C.section 360bbb-3(b)(1), unless the authorization is terminated  or revoked sooner.       Influenza A by PCR NEGATIVE NEGATIVE Final   Influenza B by PCR NEGATIVE NEGATIVE Final    Comment: (NOTE) The Xpert Xpress SARS-CoV-2/FLU/RSV plus assay is intended as an aid in the diagnosis of influenza from Nasopharyngeal swab specimens and should not be used as a sole basis for treatment. Nasal washings and aspirates are unacceptable for Xpert Xpress SARS-CoV-2/FLU/RSV testing.  Fact Sheet for Patients: BloggerCourse.com  Fact Sheet for Healthcare Providers: SeriousBroker.it  This test is not yet approved or cleared by the [place] FDA and has been  authorized for detection and/or diagnosis of SARS-CoV-2 by FDA under an Emergency Use Authorization (EUA). This EUA will remain in effect (meaning this test can be used) for the duration of the COVID-19 declaration under Section 564(b)(1) of the Act, 21 U.S.C. section 360bbb-3(b)(1), unless the authorization is terminated or revoked.     Resp Syncytial Virus by PCR NEGATIVE NEGATIVE Final    Comment: (NOTE) Fact Sheet for Patients: BloggerCourse.com  Fact Sheet for Healthcare Providers: SeriousBroker.it  This test is not yet approved or cleared by the [place] FDA and has been authorized for detection and/or diagnosis of SARS-CoV-2 by FDA under an Emergency Use Authorization (EUA). This EUA will remain in effect (meaning this test can be used) for the duration of the COVID-19 declaration under Section 564(b)(1) of the Act, 21 U.S.C. section 360bbb-3(b)(1), unless the authorization is terminated or revoked.  Performed at [facility], [address] [place], [place] [address]   Blood culture (routine x 2)  Status: None (Preliminary result)   Collection Time: 04/25/22  2:09 PM   Specimen: BLOOD RIGHT FOREARM  Result Value Ref Range Status   Specimen Description   Final    BLOOD RIGHT FOREARM Performed at [facility], [address], [place], [place] [address]    Special Requests   Final    Blood Culture adequate volume BOTTLES DRAWN AEROBIC AND ANAEROBIC Performed at [facility], [address] [place], [place] [address]    Culture PENDING  Incomplete   Report Status PENDING  Incomplete  Blood culture (routine x 2)     Status: None (Preliminary result)   Collection Time: 04/25/22  2:32 PM   Specimen: BLOOD RIGHT HAND  Result Value Ref Range Status   Specimen Description   Final    BLOOD RIGHT HAND Performed at [facility], [address], [place], [place] [address]    Special  Requests   Final    Blood Culture adequate volume BOTTLES DRAWN AEROBIC AND ANAEROBIC Performed at [facility], [address] [place], [place] [address]    Culture PENDING  Incomplete   Report Status PENDING  Incomplete         Radiology Studies: CT CHEST ABDOMEN PELVIS W CONTRAST  Result Date: 04/25/2022 CLINICAL DATA:  Sepsis EXAM: CT CHEST, ABDOMEN, AND PELVIS WITH CONTRAST TECHNIQUE: Multidetector CT imaging of the chest, abdomen and pelvis was performed following the standard protocol during bolus administration of intravenous contrast. RADIATION DOSE REDUCTION: This exam was performed according to the departmental dose-optimization program which includes automated exposure control, adjustment of the mA and/or kV according to patient size and/or use of iterative reconstruction technique. CONTRAST:  OMNIPAQUE IOHEXOL 300 MG/ML  SOLN COMPARISON:  None Available. FINDINGS: CT CHEST FINDINGS Cardiovascular: Heart is normal size. Aorta is normal caliber. Mitral valve annular calcifications. Prior TAVR. Mediastinum/Nodes: No mediastinal, hilar, or axillary adenopathy. Trachea and esophagus are unremarkable. Small scattered bilateral thyroid nodules, the largest 9 mm. Not clinically significant; no follow-up imaging recommended (ref: J Am Coll Radiol. 2015 Feb;12(2): 143-50). Lungs/Pleura: Lungs are clear. No focal airspace opacities or suspicious nodules. No effusions. Musculoskeletal: Chest wall soft tissues are unremarkable. No acute bony abnormality. CT ABDOMEN PELVIS FINDINGS Hepatobiliary: Small layering gallstones within the gallbladder. No focal hepatic abnormality. No biliary ductal dilatation. Pancreas: No focal abnormality or ductal dilatation. Spleen: No focal abnormality.  Normal size. Adrenals/Urinary Tract: No adrenal abnormality. No focal renal abnormality. No stones or hydronephrosis. Urinary bladder is unremarkable. Stomach/Bowel: Right colonic diverticulosis. No  active diverticulitis. Stomach and small bowel decompressed. Prior gastric sleeve changes within the stomach. Vascular/Lymphatic: No evidence of aneurysm or adenopathy. Reproductive: No visible focal abnormality. Other: No free fluid or free air. Musculoskeletal: No acute bony abnormality. Postoperative changes in the lumbar spine. Spinal stimulator noted in place with the tip terminating in the lower thoracic spine region. IMPRESSION: No acute findings in the chest, abdomen or pelvis. Few small layering gallstones within the gallbladder. Right colonic diverticulosis.  No active diverticulitis. Aortic atherosclerosis. Electronically Signed   By: Charlett Nose M.D.   On: 04/25/2022 17:22   DG Chest 2 View  Result Date: 04/25/2022 CLINICAL DATA:  Fever EXAM: CHEST - 2 VIEW COMPARISON:  Radiograph 09/19/2013 FINDINGS: Unchanged cardiomediastinal silhouette with prior TAVR. There is no new focal airspace consolidation. There is no large effusion or evidence of pneumothorax. Spinal cord stimulator lead overlies the mid chest. Partially visualized lumbar fusion  hardware. No acute osseous abnormality. Thoracic spondylosis. IMPRESSION: No evidence of acute cardiopulmonary disease. Electronically Signed   By: Caprice Renshaw M.D.   On: 04/25/2022 12:59        Scheduled Meds:  aspirin EC  81 mg Oral Daily   atorvastatin  40 mg Oral QHS   [START ON 04/28/2022] buprenorphine  2 patch Transdermal Weekly   enoxaparin (LOVENOX) injection  80 mg Subcutaneous Q24H   fluticasone  1 spray Each Nare BID   icosapent Ethyl  2 g Oral BID   insulin aspart  0-9 Units Subcutaneous TID WC   oxybutynin  10 mg Oral Daily   pantoprazole  40 mg Oral Daily   pregabalin  75 mg Oral TID   [START ON 04/27/2022] Vitamin D (Ergocalciferol)  50,000 Units Oral Q Sun   Continuous Infusions:  sodium chloride 10 mL/hr at 04/25/22 1526   lactated ringers 125 mL/hr at 04/26/22 0545   piperacillin-tazobactam (ZOSYN)  IV 3.375 g (04/26/22  0546)   vancomycin       LOS: 1 day   Time spent:  Azucena Fallen, DO [facility]  If 7PM-7AM, please contact night-coverage [email or web address]  04/26/2022, 8:11 AM

## 2022-04-26 NOTE — Plan of Care (Signed)
  Problem: Activity: Goal: Risk for activity intolerance will decrease Outcome: Progressing   Problem: Nutrition: Goal: Adequate nutrition will be maintained Outcome: Progressing   Problem: Coping: Goal: Level of anxiety will decrease Outcome: Progressing   Problem: Elimination: Goal: Will not experience complications related to bowel motility Outcome: Progressing   

## 2022-04-27 DIAGNOSIS — G894 Chronic pain syndrome: Secondary | ICD-10-CM | POA: Diagnosis not present

## 2022-04-27 DIAGNOSIS — R6510 Systemic inflammatory response syndrome (SIRS) of non-infectious origin without acute organ dysfunction: Secondary | ICD-10-CM | POA: Diagnosis not present

## 2022-04-27 DIAGNOSIS — I9589 Other hypotension: Secondary | ICD-10-CM | POA: Diagnosis not present

## 2022-04-27 DIAGNOSIS — N179 Acute kidney failure, unspecified: Secondary | ICD-10-CM | POA: Diagnosis not present

## 2022-04-27 LAB — COMPREHENSIVE METABOLIC PANEL
ALT: 21 U/L (ref 0–44)
AST: 32 U/L (ref 15–41)
Albumin: 3 g/dL — ABNORMAL LOW (ref 3.5–5.0)
Alkaline Phosphatase: 52 U/L (ref 38–126)
Anion gap: 8 (ref 5–15)
BUN: 17 mg/dL (ref 8–23)
CO2: 22 mmol/L (ref 22–32)
Calcium: 8.6 mg/dL — ABNORMAL LOW (ref 8.9–10.3)
Chloride: 105 mmol/L (ref 98–111)
Creatinine, Ser: 1.09 mg/dL (ref 0.61–1.24)
GFR, Estimated: >60 mL/min (ref >60)
Glucose, Bld: 124 mg/dL — ABNORMAL HIGH (ref 70–99)
Potassium: 4 mmol/L (ref 3.5–5.1)
Sodium: 135 mmol/L (ref 135–145)
Total Bilirubin: 0.3 mg/dL (ref 0.3–1.2)
Total Protein: 6.2 g/dL — ABNORMAL LOW (ref 6.5–8.1)

## 2022-04-27 LAB — EHRLICHIA ANTIBODY PANEL

## 2022-04-27 LAB — CBC
HCT: 29.1 % — ABNORMAL LOW (ref 39.0–52.0)
Hemoglobin: 9.4 g/dL — ABNORMAL LOW (ref 13.0–17.0)
MCH: 27.5 pg (ref 26.0–34.0)
MCHC: 32.3 g/dL (ref 30.0–36.0)
MCV: 85.1 fL (ref 80.0–100.0)
Platelets: 130 10*3/uL — ABNORMAL LOW (ref 150–400)
RBC: 3.42 MIL/uL — ABNORMAL LOW (ref 4.22–5.81)
RDW: 14.9 % (ref 11.5–15.5)
WBC: 2.8 10*3/uL — ABNORMAL LOW (ref 4.0–10.5)
nRBC: 0 % (ref 0.0–0.2)

## 2022-04-27 LAB — SPOTTED FEVER GROUP ANTIBODIES

## 2022-04-27 LAB — GLUCOSE, CAPILLARY
Glucose-Capillary: 120 mg/dL — ABNORMAL HIGH (ref 70–99)
Glucose-Capillary: 217 mg/dL — ABNORMAL HIGH (ref 70–99)
Glucose-Capillary: 126 mg/dL — ABNORMAL HIGH (ref 70–99)
Glucose-Capillary: 131 mg/dL — ABNORMAL HIGH (ref 70–99)

## 2022-04-27 LAB — LYME DISEASE SEROLOGY W/REFLEX

## 2022-04-27 NOTE — Progress Notes (Signed)
PROGRESS NOTE    [name]  ZOX:[id number] DOB: 1955/01/12 DOA: 04/25/2022 PCP: Eunice Blase, PA-C   Brief Narrative:  [name] is a 67 y.o. male with medical history significant of HTN, HLD, T2MD, paroxysmal atrial fibrillation on aspirin only, aortic stenosis s/p TAVR, chronic pain, prostate cancer in remission who presents with confusion/encephalopathy as well as episodes of "fogginess"  Patient presents from home with atypical mental status changes, found to be quite febrile in the ED concerning for infection, placed on broad-spectrum antibiotics and admitted per hospitalist team.   Assessment & Plan:   Principal Problem:   Sepsis Active Problems:   Hypotension   Hyperlipidemia   Chronic pain   AKI (acute kidney injury)   Hyponatremia   Hypocalcemia   Paroxysmal atrial fibrillation   OSA (obstructive sleep apnea)   Type 2 diabetes mellitus   Obesity, Class III, BMI 40-49.9 (morbid obesity)  SIRS - does not meet criteria for sepsis given no source, POA Rule out arthropod borne disease -Patient presents with recurrent fevers, 103 again overnight despite negative imaging and cultures.  Patient has no clinical symptoms of infection other than transient episode of confusion prior to admission. -Sideline discussion with ID, given mild leukopenia and thrombocytopenia could consider atypical arthropod borne disease given patient is outside gardening a majority of the day this is not unreasonable -Discontinue broad-spectrum antibiotics (vanc/zosyn) and start doxycycline -Ehrlichiosis, [place] spotted fever and Lyme's disease titers pending - likely a send out, do not expect results prior to clinical improvement. -Per wife patient interacts with birds/birdfeeders, stray cats as well as compost and mulch on a daily basis as well as a quite pond in the yard with stagnant water. -No rashes this, clinically appears well, denies recent travel, sick contacts -Patient states the  only traumatic event over the past few weeks was a single mechanical fall 2 weeks ago without loss of consciousness.   Transient episodes of encephalopathy, unclear etiology -Patient reports multiple transient episodes of "lost time/confusion"on occasion, wife has not seen these episodes. -Wife indicates patient did have a single episode of acute transient confusion and slurred speech at dinner few weeks ago as well as transient episode of slurred speech just prior to admission.  These both resolved quickly but patient's mental status did not return back to normal immediately. Patient has no history personal or family of seizure or seizure-like activity. -If all other workup is negative as above it may be prudent to have patient follow-up outpatient with neurology for further discussion about possible absence seizure's although again this would be exceedingly unlikely given patient's history.  Discussed with wife the likelihood of prohibiting husband from driving at least in the interim until these episodes resolved.  AKI -Likely secondary to poor p.o. intake and dehydration, recommend increased p.o. intake -Improving over the past 24 hours  Hypotension -Secondary to above, dehydration -Resolved with IV fluid, continue to hold hypertensive medications   Obesity, Class III, BMI 40-49.9 (morbid obesity) -BMI of 48   Type 2 diabetes mellitus -Place on sensitive SSI   OSA (obstructive sleep apnea) - Continue nighttime CPAP   Paroxysmal atrial fibrillation - Not on anticoagulation.  Continue daily aspirin.  Rate is controlled.   Hypocalcemia - Mild hypocalcemia of 8.6.  Will supplement with oral calcium.   Hyponatremia - Mild with sodium of 131 - Keep on continuous IV fluid and follow trend in the morning   AKI (acute kidney injury) - Creatinine is elevated to 1.25 from prior of  1.04 - Keep on continuous IV fluids - Avoid [facility] agent   Chronic pain -continue home  opioids -follows with pain management in [place] with [facility]   Hyperlipidemia -continue statin  DVT prophylaxis: Lovenox Code Status: Full Family Communication: Wife updated over the phone  Status is: Inpatient  Dispo: The patient is from: Home              Anticipated d/c is to: Home              Anticipated d/c date is: 24 to 48 hours pending clinical course and findings as above              Patient currently not medically stable for discharge  Consultants:  None  Procedures:  None  Antimicrobials:  Doxycycline  Subjective: No acute issues or events overnight, patient reports he is back to baseline today denies nausea vomiting diarrhea constipation any fevers chills chest pain or shortness of breath  Objective: Vitals:   04/26/22 1456 04/26/22 2107 04/27/22 0153 04/27/22 0547  BP: (!) 87/45 104/69 114/70 112/63  Pulse: 88 86 84 88  Resp: (!) 23  Temp: (!) 100.4 F (38 C) 99.5 F (37.5 C) 99.4 F (37.4 C) 98.5 F (36.9 C)  TempSrc: Oral Oral Oral Oral  SpO2: 98% 100% 99% 99%  Weight:      Height:        Intake/Output Summary (Last 24 hours) at 04/27/2022 0743 Last data filed at 04/27/2022 0300 Gross per 24 hour  Intake 2210.67 ml  Output 600 ml  Net 1610.67 ml    Filed Weights   04/25/22 1213 04/25/22 1315  Weight: 111.6 kg (!) 156.5 kg    Examination:  General:  Pleasantly resting in bed, No acute distress.  ANO x 4 HEENT:  Normocephalic atraumatic.  Sclerae nonicteric, noninjected.  Extraocular movements intact bilaterally. Neck:  Without mass or deformity.  Trachea is midline. Lungs:  Clear to auscultate bilaterally without rhonchi, wheeze, or rales. Heart:  Regular rate and rhythm.  Without murmurs, rubs, or gallops. Abdomen:  Soft, nontender, nondistended.  Without guarding or rebound. Extremities: Without cyanosis, clubbing, edema, or obvious deformity. Vascular:  Dorsalis pedis and posterior tibial pulses palpable  bilaterally. Skin:  Warm and dry, no erythema, no ulcerations.   Data Reviewed: I have personally reviewed following labs and imaging studies  CBC: Recent Labs  Lab 04/25/22 1253 04/26/22 0616  WBC 3.7* 4.5  NEUTROABS 2.4  --   HGB 10.5* 10.4*  HCT 32.0* 34.4*  MCV 82.7 88.7  PLT 164 136*    Basic Metabolic Panel: Recent Labs  Lab 04/25/22 1253 04/26/22 0616  NA 131* 135  K 3.9 4.1  CL 101 102  CO2 21* 21*  GLUCOSE 181* 102*  BUN 20 20  CREATININE 1.25* 1.31*  CALCIUM 8.6* 8.6*    GFR: Estimated Creatinine Clearance: 84.6 mL/min (A) (by C-G formula based on SCr of 1.31 mg/dL (H)). Liver Function Tests: Recent Labs  Lab 04/25/22 1253  AST 26  ALT 16  ALKPHOS 61  BILITOT 0.8  PROT 7.3  ALBUMIN 3.7    No results for input(s): "LIPASE", "AMYLASE" in the last 168 hours. No results for input(s): "AMMONIA" in the last 168 hours. Coagulation Profile: No results for input(s): "INR", "PROTIME" in the last 168 hours. Cardiac Enzymes: No results for input(s): "CKTOTAL", "CKMB", "CKMBINDEX", "TROPONINI" in the last 168 hours. BNP (last 3 results) No results for input(s): "PROBNP" in the  last 8760 hours. HbA1C: Recent Labs    04/25/22 2059  HGBA1C 6.2*    CBG: Recent Labs  Lab 04/25/22 2308 04/26/22 0750 04/26/22 1145 04/26/22 2103 04/27/22 0737  GLUCAP 149* 134* 115* 159* 120*    Lipid Profile: No results for input(s): "CHOL", "HDL", "LDLCALC", "TRIG", "CHOLHDL", "LDLDIRECT" in the last 72 hours. Thyroid Function Tests: No results for input(s): "TSH", "T4TOTAL", "FREET4", "T3FREE", "THYROIDAB" in the last 72 hours. Anemia Panel: No results for input(s): "VITAMINB12", "FOLATE", "FERRITIN", "TIBC", "IRON", "RETICCTPCT" in the last 72 hours. Sepsis Labs: Recent Labs  Lab 04/25/22 1253  LATICACIDVEN 1.8     Recent Results (from the past 240 hour(s))  Resp panel by RT-PCR (RSV, Flu A&B, Covid) Anterior Nasal Swab     Status: None   Collection  Time: 04/25/22 12:53 PM   Specimen: Anterior Nasal Swab  Result Value Ref Range Status   SARS Coronavirus 2 by RT PCR NEGATIVE NEGATIVE Final    Comment: (NOTE) SARS-CoV-2 target nucleic acids are NOT DETECTED.  The SARS-CoV-2 RNA is generally detectable in upper respiratory specimens during the acute phase of infection. The lowest concentration of SARS-CoV-2 viral copies this assay can detect is 138 copies/mL. A negative result does not preclude SARS-Cov-2 infection and should not be used as the sole basis for treatment or other patient management decisions. A negative result may occur with  improper specimen collection/handling, submission of specimen other than nasopharyngeal swab, presence of viral mutation(s) within the areas targeted by this assay, and inadequate number of viral copies(<138 copies/mL). A negative result must be combined with clinical observations, patient history, and epidemiological information. The expected result is Negative.  Fact Sheet for Patients:  BloggerCourse.com  Fact Sheet for Healthcare Providers:  SeriousBroker.it  This test is no t yet approved or cleared by the [place] FDA and  has been authorized for detection and/or diagnosis of SARS-CoV-2 by FDA under an Emergency Use Authorization (EUA). This EUA will remain  in effect (meaning this test can be used) for the duration of the COVID-19 declaration under Section 564(b)(1) of the Act, 21 U.S.C.section 360bbb-3(b)(1), unless the authorization is terminated  or revoked sooner.       Influenza A by PCR NEGATIVE NEGATIVE Final   Influenza B by PCR NEGATIVE NEGATIVE Final    Comment: (NOTE) The Xpert Xpress SARS-CoV-2/FLU/RSV plus assay is intended as an aid in the diagnosis of influenza from Nasopharyngeal swab specimens and should not be used as a sole basis for treatment. Nasal washings and aspirates are unacceptable for Xpert Xpress  SARS-CoV-2/FLU/RSV testing.  Fact Sheet for Patients: BloggerCourse.com  Fact Sheet for Healthcare Providers: SeriousBroker.it  This test is not yet approved or cleared by the [place] FDA and has been authorized for detection and/or diagnosis of SARS-CoV-2 by FDA under an Emergency Use Authorization (EUA). This EUA will remain in effect (meaning this test can be used) for the duration of the COVID-19 declaration under Section 564(b)(1) of the Act, 21 U.S.C. section 360bbb-3(b)(1), unless the authorization is terminated or revoked.     Resp Syncytial Virus by PCR NEGATIVE NEGATIVE Final    Comment: (NOTE) Fact Sheet for Patients: BloggerCourse.com  Fact Sheet for Healthcare Providers: SeriousBroker.it  This test is not yet approved or cleared by the [place] FDA and has been authorized for detection and/or diagnosis of SARS-CoV-2 by FDA under an Emergency Use Authorization (EUA). This EUA will remain in effect (meaning this test can be used) for the duration of  the COVID-19 declaration under Section 564(b)(1) of the Act, 21 U.S.C. section 360bbb-3(b)(1), unless the authorization is terminated or revoked.  Performed at [facility], [address] [place], [place] [address]   Blood culture (routine x 2)     Status: None (Preliminary result)   Collection Time: 04/25/22  2:09 PM   Specimen: BLOOD RIGHT FOREARM  Result Value Ref Range Status   Specimen Description   Final    BLOOD RIGHT FOREARM Performed at [facility], [address] [place], [place] [address]    Special Requests   Final    Blood Culture adequate volume BOTTLES DRAWN AEROBIC AND ANAEROBIC Performed at [facility], [address] [place], [place] [address]    Culture   Final    NO GROWTH < 24 HOURS Performed at [facility], [address], [place],  [place] [address]    Report Status PENDING  Incomplete  Blood culture (routine x 2)     Status: None (Preliminary result)   Collection Time: 04/25/22  2:32 PM   Specimen: BLOOD RIGHT HAND  Result Value Ref Range Status   Specimen Description   Final    BLOOD RIGHT HAND Performed at [facility], [address], [place], [place] [address]    Special Requests   Final    Blood Culture adequate volume BOTTLES DRAWN AEROBIC AND ANAEROBIC Performed at [facility], [address] [place], [place] [address]    Culture   Final    NO GROWTH < 24 HOURS Performed at [facility], [address], [place], [place] [address]    Report Status PENDING  Incomplete  Respiratory (~20 pathogens) panel by PCR     Status: None   Collection Time: 04/25/22 11:30 PM   Specimen: Nasopharyngeal Swab; Respiratory  Result Value Ref Range Status   Adenovirus NOT DETECTED NOT DETECTED Final   Coronavirus 229E NOT DETECTED NOT DETECTED Final    Comment: (NOTE) The Coronavirus on the Respiratory Panel, DOES NOT test for the novel  Coronavirus (2019 nCoV)    Coronavirus HKU1 NOT DETECTED NOT DETECTED Final   Coronavirus NL63 NOT DETECTED NOT DETECTED Final   Coronavirus OC43 NOT DETECTED NOT DETECTED Final   Metapneumovirus NOT DETECTED NOT DETECTED Final   Rhinovirus / Enterovirus NOT DETECTED NOT DETECTED Final   Influenza A NOT DETECTED NOT DETECTED Final   Influenza B NOT DETECTED NOT DETECTED Final   Parainfluenza Virus 1 NOT DETECTED NOT DETECTED Final   Parainfluenza Virus 2 NOT DETECTED NOT DETECTED Final   Parainfluenza Virus 3 NOT DETECTED NOT DETECTED Final   Parainfluenza Virus 4 NOT DETECTED NOT DETECTED Final   Respiratory Syncytial Virus NOT DETECTED NOT DETECTED Final   Bordetella pertussis NOT DETECTED NOT DETECTED Final   Bordetella Parapertussis NOT DETECTED NOT DETECTED Final   Chlamydophila pneumoniae NOT DETECTED NOT DETECTED Final   Mycoplasma pneumoniae NOT DETECTED  NOT DETECTED Final    Comment: Performed at [facility], [address], [place], [place] [address]         Radiology Studies: CT CHEST ABDOMEN PELVIS W CONTRAST  Result Date: 04/25/2022 CLINICAL DATA:  Sepsis EXAM: CT CHEST, ABDOMEN, AND PELVIS WITH CONTRAST TECHNIQUE: Multidetector CT imaging of the chest, abdomen and pelvis was performed following the standard protocol during bolus administration of intravenous contrast. RADIATION DOSE REDUCTION: This exam was performed according to the departmental  dose-optimization program which includes automated exposure control, adjustment of the mA and/or kV according to patient size and/or use of iterative reconstruction technique. CONTRAST:  OMNIPAQUE IOHEXOL 300 MG/ML  SOLN COMPARISON:  None Available. FINDINGS: CT CHEST FINDINGS Cardiovascular: Heart is normal size. Aorta is normal caliber. Mitral valve annular calcifications. Prior TAVR. Mediastinum/Nodes: No mediastinal, hilar, or axillary adenopathy. Trachea and esophagus are unremarkable. Small scattered bilateral thyroid nodules, the largest 9 mm. Not clinically significant; no follow-up imaging recommended (ref: J Am Coll Radiol. 2015 Feb;12(2): 143-50). Lungs/Pleura: Lungs are clear. No focal airspace opacities or suspicious nodules. No effusions. Musculoskeletal: Chest wall soft tissues are unremarkable. No acute bony abnormality. CT ABDOMEN PELVIS FINDINGS Hepatobiliary: Small layering gallstones within the gallbladder. No focal hepatic abnormality. No biliary ductal dilatation. Pancreas: No focal abnormality or ductal dilatation. Spleen: No focal abnormality.  Normal size. Adrenals/Urinary Tract: No adrenal abnormality. No focal renal abnormality. No stones or hydronephrosis. Urinary bladder is unremarkable. Stomach/Bowel: Right colonic diverticulosis. No active diverticulitis. Stomach and small bowel decompressed. Prior gastric sleeve changes within the stomach. Vascular/Lymphatic: No  evidence of aneurysm or adenopathy. Reproductive: No visible focal abnormality. Other: No free fluid or free air. Musculoskeletal: No acute bony abnormality. Postoperative changes in the lumbar spine. Spinal stimulator noted in place with the tip terminating in the lower thoracic spine region. IMPRESSION: No acute findings in the chest, abdomen or pelvis. Few small layering gallstones within the gallbladder. Right colonic diverticulosis.  No active diverticulitis. Aortic atherosclerosis. Electronically Signed   By: Charlett Nose M.D.   On: 04/25/2022 17:22   DG Chest 2 View  Result Date: 04/25/2022 CLINICAL DATA:  Fever EXAM: CHEST - 2 VIEW COMPARISON:  Radiograph 09/19/2013 FINDINGS: Unchanged cardiomediastinal silhouette with prior TAVR. There is no new focal airspace consolidation. There is no large effusion or evidence of pneumothorax. Spinal cord stimulator lead overlies the mid chest. Partially visualized lumbar fusion hardware. No acute osseous abnormality. Thoracic spondylosis. IMPRESSION: No evidence of acute cardiopulmonary disease. Electronically Signed   By: Caprice Renshaw M.D.   On: 04/25/2022 12:59        Scheduled Meds:  aspirin EC  81 mg Oral Daily   atorvastatin  40 mg Oral QHS   [START ON 04/28/2022] buprenorphine  2 patch Transdermal Weekly   enoxaparin (LOVENOX) injection  80 mg Subcutaneous Q24H   fluticasone  1 spray Each Nare BID   icosapent Ethyl  2 g Oral BID   insulin aspart  0-9 Units Subcutaneous TID WC   oxybutynin  10 mg Oral Daily   pantoprazole  40 mg Oral Daily   pregabalin  75 mg Oral TID   Vitamin D (Ergocalciferol)  50,000 Units Oral Q Sun   Continuous Infusions:  sodium chloride 10 mL/hr at 04/25/22 1526   doxycycline (VIBRAMYCIN) IV 100 mg (04/27/22 0124)     LOS: 2 days   Time spent:  Azucena Fallen, DO [facility]  If 7PM-7AM, please contact night-coverage [email or web address]  04/27/2022, 7:43 AM

## 2022-04-28 DIAGNOSIS — E119 Type 2 diabetes mellitus without complications: Secondary | ICD-10-CM | POA: Diagnosis not present

## 2022-04-28 DIAGNOSIS — R6510 Systemic inflammatory response syndrome (SIRS) of non-infectious origin without acute organ dysfunction: Secondary | ICD-10-CM

## 2022-04-28 DIAGNOSIS — N179 Acute kidney failure, unspecified: Secondary | ICD-10-CM | POA: Diagnosis not present

## 2022-04-28 DIAGNOSIS — E871 Hypo-osmolality and hyponatremia: Secondary | ICD-10-CM | POA: Diagnosis not present

## 2022-04-28 LAB — CBC
HCT: 29.3 % — ABNORMAL LOW (ref 39.0–52.0)
Hemoglobin: 9.2 g/dL — ABNORMAL LOW (ref 13.0–17.0)
MCH: 26.7 pg (ref 26.0–34.0)
MCHC: 31.4 g/dL (ref 30.0–36.0)
MCV: 84.9 fL (ref 80.0–100.0)
Platelets: 140 10*3/uL — ABNORMAL LOW (ref 150–400)
RBC: 3.45 MIL/uL — ABNORMAL LOW (ref 4.22–5.81)
RDW: 14.6 % (ref 11.5–15.5)
WBC: 2.7 10*3/uL — ABNORMAL LOW (ref 4.0–10.5)
nRBC: 0 % (ref 0.0–0.2)

## 2022-04-28 LAB — COMPREHENSIVE METABOLIC PANEL
ALT: 25 U/L (ref 0–44)
AST: 34 U/L (ref 15–41)
Albumin: 3.2 g/dL — ABNORMAL LOW (ref 3.5–5.0)
Alkaline Phosphatase: 55 U/L (ref 38–126)
Anion gap: 12 (ref 5–15)
BUN: 19 mg/dL (ref 8–23)
CO2: 21 mmol/L — ABNORMAL LOW (ref 22–32)
Calcium: 8.6 mg/dL — ABNORMAL LOW (ref 8.9–10.3)
Chloride: 103 mmol/L (ref 98–111)
Creatinine, Ser: 0.88 mg/dL (ref 0.61–1.24)
GFR, Estimated: >60 mL/min (ref >60)
Glucose, Bld: 123 mg/dL — ABNORMAL HIGH (ref 70–99)
Potassium: 3.9 mmol/L (ref 3.5–5.1)
Sodium: 136 mmol/L (ref 135–145)
Total Bilirubin: 0.6 mg/dL (ref 0.3–1.2)
Total Protein: 6.6 g/dL (ref 6.5–8.1)

## 2022-04-28 LAB — GLUCOSE, CAPILLARY
Glucose-Capillary: 116 mg/dL — ABNORMAL HIGH (ref 70–99)
Glucose-Capillary: 142 mg/dL — ABNORMAL HIGH (ref 70–99)

## 2022-04-28 MED ORDER — DOXYCYCLINE HYCLATE 100 MG PO CAPS: 100.0000 mg | ORAL_CAPSULE | Freq: Two times a day (BID) | ORAL | 0 refills | Status: DC

## 2022-04-28 NOTE — Discharge Summary (Signed)
Physician Discharge Summary  [name] ZOX:[id number] DOB: 08-25-1955 DOA: 04/25/2022  PCP: Eunice Blase, PA-C  Admit date: 04/25/2022 Discharge date: 04/28/2022  Admitted From: Home Disposition:  Home  Recommendations for Outpatient Follow-up:  Follow up with PCP in 1-2 weeks  Home Health:None  Equipment/Devices:None  Discharge Condition:Stable  CODE STATUS:Full  Diet recommendation: As tolerated    Brief/Interim Summary: [name] is a 67 y.o. male with medical history significant of HTN, HLD, T2MD, paroxysmal atrial fibrillation on aspirin only, aortic stenosis s/p TAVR, chronic pain, prostate cancer in remission who presents with confusion/encephalopathy as well as episodes of "fogginess"   Patient presents from home with atypical mental status changes, found to be quite febrile in the ED concerning for infection, placed on broad-spectrum antibiotics and admitted per hospitalist team.  Patient admitted as above with transient encephalopathy and fever - patient did not meet Sepsis criteria given no source was ever identified. Discussed with ID and considering his numerous outdoor/yard activities there was concern for arthropod/tick borne illness although no obvious signs. Transitioned to doxycycline for 7 day course and given his rapid improvement he is otherwise stable for discharge home with close outpatient follow up. Discussed need for close monitoring for possible tick bites in the future.  Discharge Diagnoses:  Principal Problem:   Sepsis Active Problems:   Hypotension   Hyperlipidemia   Chronic pain   AKI (acute kidney injury)   Hyponatremia   Hypocalcemia   Paroxysmal atrial fibrillation   OSA (obstructive sleep apnea)   Type 2 diabetes mellitus   Obesity, Class III, BMI 40-49.9 (morbid obesity)    Discharge Instructions   Allergies as of 04/28/2022   No Known Allergies      Medication List     STOP taking these medications    diclofenac Sodium  1 % Gel Commonly known as: VOLTAREN   etodolac 400 MG tablet Commonly known as: LODINE   lisinopril 10 MG tablet Commonly known as: ZESTRIL   metoprolol tartrate 25 MG tablet Commonly known as: LOPRESSOR   oxyCODONE 20 mg 12 hr tablet Commonly known as: OxyCONTIN   Oxycodone HCl 10 MG Tabs       TAKE these medications    aspirin EC 81 MG tablet Take 81 mg by mouth daily. Swallow whole. What changed: Another medication with the same name was removed. Continue taking this medication, and follow the directions you see here.   atorvastatin 40 MG tablet Commonly known as: LIPITOR Take 40 mg by mouth at bedtime.   buprenorphine 20 MCG/HR Ptwk Commonly known as: BUTRANS Place 1 patch onto the skin every Monday.   doxycycline 100 MG capsule Commonly known as: VIBRAMYCIN Take 1 capsule (100 mg total) by mouth 2 (two) times daily.   fluticasone 50 MCG/ACT nasal spray Commonly known as: FLONASE Place 1 spray into both nostrils in the morning and at bedtime.   omeprazole 40 MG capsule Commonly known as: PRILOSEC Take 40 mg by mouth daily before breakfast.   oxybutynin 10 MG 24 hr tablet Commonly known as: DITROPAN-XL Take 10 mg by mouth in the morning.   oxyCODONE-acetaminophen 5-325 MG tablet Commonly known as: PERCOCET/ROXICET Take 1 tablet by mouth daily as needed (for pain).   pregabalin 75 MG capsule Commonly known as: LYRICA Take 75 mg by mouth in the morning, at noon, and at bedtime.   silver sulfADIAZINE 1 % cream Commonly known as: SILVADENE Apply 1 Application topically daily as needed (for wound care).   triamterene-hydrochlorothiazide 75-50  MG tablet Commonly known as: MAXZIDE Take 1 tablet by mouth daily.   Vascepa 1 g capsule Generic drug: icosapent Ethyl Take 2 g by mouth in the morning and at bedtime.   Vitamin D (Ergocalciferol) 1.25 MG (50000 UNIT) Caps capsule Commonly known as: DRISDOL Take 50,000 Units by mouth every Sunday.   Xigduo  XR 05-998 MG Tb24 Generic drug: Dapagliflozin Pro-metFORMIN ER Take 2 tablets by mouth in the morning.        No Known Allergies  Consultations: None   Procedures/Studies: CT CHEST ABDOMEN PELVIS W CONTRAST  Result Date: 04/25/2022 CLINICAL DATA:  Sepsis EXAM: CT CHEST, ABDOMEN, AND PELVIS WITH CONTRAST TECHNIQUE: Multidetector CT imaging of the chest, abdomen and pelvis was performed following the standard protocol during bolus administration of intravenous contrast. RADIATION DOSE REDUCTION: This exam was performed according to the departmental dose-optimization program which includes automated exposure control, adjustment of the mA and/or kV according to patient size and/or use of iterative reconstruction technique. CONTRAST:  OMNIPAQUE IOHEXOL 300 MG/ML  SOLN COMPARISON:  None Available. FINDINGS: CT CHEST FINDINGS Cardiovascular: Heart is normal size. Aorta is normal caliber. Mitral valve annular calcifications. Prior TAVR. Mediastinum/Nodes: No mediastinal, hilar, or axillary adenopathy. Trachea and esophagus are unremarkable. Small scattered bilateral thyroid nodules, the largest 9 mm. Not clinically significant; no follow-up imaging recommended (ref: J Am Coll Radiol. 2015 Feb;12(2): 143-50). Lungs/Pleura: Lungs are clear. No focal airspace opacities or suspicious nodules. No effusions. Musculoskeletal: Chest wall soft tissues are unremarkable. No acute bony abnormality. CT ABDOMEN PELVIS FINDINGS Hepatobiliary: Small layering gallstones within the gallbladder. No focal hepatic abnormality. No biliary ductal dilatation. Pancreas: No focal abnormality or ductal dilatation. Spleen: No focal abnormality.  Normal size. Adrenals/Urinary Tract: No adrenal abnormality. No focal renal abnormality. No stones or hydronephrosis. Urinary bladder is unremarkable. Stomach/Bowel: Right colonic diverticulosis. No active diverticulitis. Stomach and small bowel decompressed. Prior gastric sleeve changes  within the stomach. Vascular/Lymphatic: No evidence of aneurysm or adenopathy. Reproductive: No visible focal abnormality. Other: No free fluid or free air. Musculoskeletal: No acute bony abnormality. Postoperative changes in the lumbar spine. Spinal stimulator noted in place with the tip terminating in the lower thoracic spine region. IMPRESSION: No acute findings in the chest, abdomen or pelvis. Few small layering gallstones within the gallbladder. Right colonic diverticulosis.  No active diverticulitis. Aortic atherosclerosis. Electronically Signed   By: Charlett Nose M.D.   On: 04/25/2022 17:22   DG Chest 2 View  Result Date: 04/25/2022 CLINICAL DATA:  Fever EXAM: CHEST - 2 VIEW COMPARISON:  Radiograph 09/19/2013 FINDINGS: Unchanged cardiomediastinal silhouette with prior TAVR. There is no new focal airspace consolidation. There is no large effusion or evidence of pneumothorax. Spinal cord stimulator lead overlies the mid chest. Partially visualized lumbar fusion hardware. No acute osseous abnormality. Thoracic spondylosis. IMPRESSION: No evidence of acute cardiopulmonary disease. Electronically Signed   By: Caprice Renshaw M.D.   On: 04/25/2022 12:59     Subjective: No acute issues/events overnight   Discharge Exam: Vitals:   04/27/22 2114 04/28/22 0440  BP: 107/64 (!) 121/52  Pulse: 81 70  Resp: 18   Temp: (!) 97.4 F (36.3 C) 100 F (37.8 C)  SpO2: 100% 98%   Vitals:   04/27/22 0547 04/27/22 1246 04/27/22 2114 04/28/22 0440  BP: 112/63 108/64 107/64 (!) 121/52  Pulse: 88 72 81 70  Resp: (!) 23 18 18    Temp: 98.5 F (36.9 C) 97.6 F (36.4 C) (!) 97.4 F (36.3 C) 100  F (37.8 C)  TempSrc: Oral Oral Oral Oral  SpO2: 99% 100% 100% 98%  Weight:      Height:        General: Pt is alert, awake, not in acute distress Cardiovascular: RRR, S1/S2 +, no rubs, no gallops Respiratory: CTA bilaterally, no wheezing, no rhonchi Abdominal: Soft, NT, ND, bowel sounds + Extremities: no edema,  no cyanosis    The results of significant diagnostics from this hospitalization (including imaging, microbiology, ancillary and laboratory) are listed below for reference.     Microbiology: Recent Results (from the past 240 hour(s))  Resp panel by RT-PCR (RSV, Flu A&B, Covid) Anterior Nasal Swab     Status: None   Collection Time: 04/25/22 12:53 PM   Specimen: Anterior Nasal Swab  Result Value Ref Range Status   SARS Coronavirus 2 by RT PCR NEGATIVE NEGATIVE Final    Comment: (NOTE) SARS-CoV-2 target nucleic acids are NOT DETECTED.  The SARS-CoV-2 RNA is generally detectable in upper respiratory specimens during the acute phase of infection. The lowest concentration of SARS-CoV-2 viral copies this assay can detect is 138 copies/mL. A negative result does not preclude SARS-Cov-2 infection and should not be used as the sole basis for treatment or other patient management decisions. A negative result may occur with  improper specimen collection/handling, submission of specimen other than nasopharyngeal swab, presence of viral mutation(s) within the areas targeted by this assay, and inadequate number of viral copies(<138 copies/mL). A negative result must be combined with clinical observations, patient history, and epidemiological information. The expected result is Negative.  Fact Sheet for Patients:  BloggerCourse.com  Fact Sheet for Healthcare Providers:  SeriousBroker.it  This test is no t yet approved or cleared by the [place] FDA and  has been authorized for detection and/or diagnosis of SARS-CoV-2 by FDA under an Emergency Use Authorization (EUA). This EUA will remain  in effect (meaning this test can be used) for the duration of the COVID-19 declaration under Section 564(b)(1) of the Act, 21 U.S.C.section 360bbb-3(b)(1), unless the authorization is terminated  or revoked sooner.       Influenza A by PCR NEGATIVE  NEGATIVE Final   Influenza B by PCR NEGATIVE NEGATIVE Final    Comment: (NOTE) The Xpert Xpress SARS-CoV-2/FLU/RSV plus assay is intended as an aid in the diagnosis of influenza from Nasopharyngeal swab specimens and should not be used as a sole basis for treatment. Nasal washings and aspirates are unacceptable for Xpert Xpress SARS-CoV-2/FLU/RSV testing.  Fact Sheet for Patients: BloggerCourse.com  Fact Sheet for Healthcare Providers: SeriousBroker.it  This test is not yet approved or cleared by the [place] FDA and has been authorized for detection and/or diagnosis of SARS-CoV-2 by FDA under an Emergency Use Authorization (EUA). This EUA will remain in effect (meaning this test can be used) for the duration of the COVID-19 declaration under Section 564(b)(1) of the Act, 21 U.S.C. section 360bbb-3(b)(1), unless the authorization is terminated or revoked.     Resp Syncytial Virus by PCR NEGATIVE NEGATIVE Final    Comment: (NOTE) Fact Sheet for Patients: BloggerCourse.com  Fact Sheet for Healthcare Providers: SeriousBroker.it  This test is not yet approved or cleared by the [place] FDA and has been authorized for detection and/or diagnosis of SARS-CoV-2 by FDA under an Emergency Use Authorization (EUA). This EUA will remain in effect (meaning this test can be used) for the duration of the COVID-19 declaration under Section 564(b)(1) of the Act, 21 U.S.C. section 360bbb-3(b)(1), unless the  authorization is terminated or revoked.  Performed at [facility], [address] [place], [place] [address]   Blood culture (routine x 2)     Status: None (Preliminary result)   Collection Time: 04/25/22  2:09 PM   Specimen: BLOOD RIGHT FOREARM  Result Value Ref Range Status   Specimen Description   Final    BLOOD RIGHT FOREARM Performed at [facility], [address] [place], [place] [address]    Special Requests   Final    Blood Culture adequate volume BOTTLES DRAWN AEROBIC AND ANAEROBIC Performed at [facility], [address] [place], [place] [address]    Culture   Final    NO GROWTH 3 DAYS Performed at [facility], [address] [place], [place] [address]    Report Status PENDING  Incomplete  Blood culture (routine x 2)     Status: None (Preliminary result)   Collection Time: 04/25/22  2:32 PM   Specimen: BLOOD RIGHT HAND  Result Value Ref Range Status   Specimen Description   Final    BLOOD RIGHT HAND Performed at [facility], [address] [place], [place] [address]    Special Requests   Final    Blood Culture adequate volume BOTTLES DRAWN AEROBIC AND ANAEROBIC Performed at [facility], [address] [place], [place] [address]    Culture   Final    NO GROWTH 3 DAYS Performed at [facility], [address] [place], [place] [address]    Report Status PENDING  Incomplete  Respiratory (~20 pathogens) panel by PCR     Status: None   Collection Time: 04/25/22 11:30 PM   Specimen: Nasopharyngeal Swab; Respiratory  Result Value Ref Range Status   Adenovirus NOT DETECTED NOT DETECTED Final   Coronavirus 229E NOT DETECTED NOT DETECTED Final    Comment: (NOTE) The Coronavirus on the Respiratory Panel, DOES NOT test for the novel  Coronavirus (2019 nCoV)    Coronavirus HKU1 NOT DETECTED NOT DETECTED Final   Coronavirus NL63 NOT DETECTED NOT DETECTED Final   Coronavirus OC43 NOT DETECTED NOT DETECTED Final   Metapneumovirus NOT DETECTED NOT DETECTED Final   Rhinovirus / Enterovirus NOT DETECTED NOT DETECTED Final   Influenza A NOT DETECTED NOT DETECTED Final   Influenza B NOT DETECTED NOT DETECTED Final   Parainfluenza Virus 1 NOT DETECTED NOT DETECTED Final   Parainfluenza Virus 2 NOT DETECTED NOT DETECTED Final   Parainfluenza Virus 3 NOT DETECTED NOT DETECTED Final    Parainfluenza Virus 4 NOT DETECTED NOT DETECTED Final   Respiratory Syncytial Virus NOT DETECTED NOT DETECTED Final   Bordetella pertussis NOT DETECTED NOT DETECTED Final   Bordetella Parapertussis NOT DETECTED NOT DETECTED Final   Chlamydophila pneumoniae NOT DETECTED NOT DETECTED Final   Mycoplasma pneumoniae NOT DETECTED NOT DETECTED Final    Comment: Performed at [facility], [address], [place], [place] [address]     Labs: BNP (last 3 results) No results for input(s): "BNP" in the last 8760 hours. Basic Metabolic Panel: Recent Labs  Lab 04/25/22 1253 04/26/22 0616 04/27/22 0729 04/28/22 0531  NA 131* 135 135 136  K 3.9 4.1 4.0 3.9  CL 101 102 105 103  CO2 21* 21* 22 21*  GLUCOSE 181* 102* 124* 123*  BUN 20 20 17 19   CREATININE 1.25* 1.31* 1.09 0.88  CALCIUM 8.6* 8.6* 8.6* 8.6*  Liver Function Tests: Recent Labs  Lab 04/25/22 1253 04/27/22 0729 04/28/22 0531  AST 26 32 34  ALT ALKPHOS 61 52 55  BILITOT 0.8 0.3 0.6  PROT 7.3 6.2* 6.6  ALBUMIN 3.7 3.0* 3.2*   No results for input(s): "LIPASE", "AMYLASE" in the last 168 hours. No results for input(s): "AMMONIA" in the last 168 hours. CBC: Recent Labs  Lab 04/25/22 1253 04/26/22 0616 04/27/22 0729 04/28/22 0531  WBC 3.7* 4.5 2.8* 2.7*  NEUTROABS 2.4  --   --   --   HGB 10.5* 10.4* 9.4* 9.2*  HCT 32.0* 34.4* 29.1* 29.3*  MCV 82.7 88.7 85.1 84.9  PLT 164 136* 130* 140*   Cardiac Enzymes: No results for input(s): "CKTOTAL", "CKMB", "CKMBINDEX", "TROPONINI" in the last 168 hours. BNP: Invalid input(s): "POCBNP" CBG: Recent Labs  Lab 04/27/22 0737 04/27/22 1137 04/27/22 1644 04/27/22 2110 04/28/22 0736  GLUCAP 120* 217* 126* 131* 116*   D-Dimer No results for input(s): "DDIMER" in the last 72 hours. Hgb A1c Recent Labs    04/25/22 2059  HGBA1C 6.2*   Lipid Profile No results for input(s): "CHOL", "HDL", "LDLCALC", "TRIG", "CHOLHDL", "LDLDIRECT" in the last 72  hours. Thyroid function studies No results for input(s): "TSH", "T4TOTAL", "T3FREE", "THYROIDAB" in the last 72 hours.  Invalid input(s): "FREET3" Anemia work up No results for input(s): "VITAMINB12", "FOLATE", "FERRITIN", "TIBC", "IRON", "RETICCTPCT" in the last 72 hours. Urinalysis    Component Value Date/Time   COLORURINE YELLOW 04/25/2022 1253   APPEARANCEUR CLEAR 04/25/2022 1253   LABSPEC 1.015 04/25/2022 1253   PHURINE 5.5 04/25/2022 1253   GLUCOSEU >=500 (A) 04/25/2022 1253   HGBUR NEGATIVE 04/25/2022 1253   BILIRUBINUR NEGATIVE 04/25/2022 1253   KETONESUR NEGATIVE 04/25/2022 1253   PROTEINUR 30 (A) 04/25/2022 1253   UROBILINOGEN 0.2 06/14/2011 1818   NITRITE NEGATIVE 04/25/2022 1253   LEUKOCYTESUR NEGATIVE 04/25/2022 1253   Sepsis Labs Recent Labs  Lab 04/25/22 1253 04/26/22 0616 04/27/22 0729 04/28/22 0531  WBC 3.7* 4.5 2.8* 2.7*   Microbiology Recent Results (from the past 240 hour(s))  Resp panel by RT-PCR (RSV, Flu A&B, Covid) Anterior Nasal Swab     Status: None   Collection Time: 04/25/22 12:53 PM   Specimen: Anterior Nasal Swab  Result Value Ref Range Status   SARS Coronavirus 2 by RT PCR NEGATIVE NEGATIVE Final    Comment: (NOTE) SARS-CoV-2 target nucleic acids are NOT DETECTED.  The SARS-CoV-2 RNA is generally detectable in upper respiratory specimens during the acute phase of infection. The lowest concentration of SARS-CoV-2 viral copies this assay can detect is 138 copies/mL. A negative result does not preclude SARS-Cov-2 infection and should not be used as the sole basis for treatment or other patient management decisions. A negative result may occur with  improper specimen collection/handling, submission of specimen other than nasopharyngeal swab, presence of viral mutation(s) within the areas targeted by this assay, and inadequate number of viral copies(<138 copies/mL). A negative result must be combined with clinical observations, patient  history, and epidemiological information. The expected result is Negative.  Fact Sheet for Patients:  BloggerCourse.com  Fact Sheet for Healthcare Providers:  SeriousBroker.it  This test is no t yet approved or cleared by the [place] FDA and  has been authorized for detection and/or diagnosis of SARS-CoV-2 by FDA under an Emergency Use Authorization (EUA). This EUA will remain  in effect (meaning this test can be used) for the duration of the COVID-19 declaration under  Section 564(b)(1) of the Act, 21 U.S.C.section 360bbb-3(b)(1), unless the authorization is terminated  or revoked sooner.       Influenza A by PCR NEGATIVE NEGATIVE Final   Influenza B by PCR NEGATIVE NEGATIVE Final    Comment: (NOTE) The Xpert Xpress SARS-CoV-2/FLU/RSV plus assay is intended as an aid in the diagnosis of influenza from Nasopharyngeal swab specimens and should not be used as a sole basis for treatment. Nasal washings and aspirates are unacceptable for Xpert Xpress SARS-CoV-2/FLU/RSV testing.  Fact Sheet for Patients: BloggerCourse.com  Fact Sheet for Healthcare Providers: SeriousBroker.it  This test is not yet approved or cleared by the [place] FDA and has been authorized for detection and/or diagnosis of SARS-CoV-2 by FDA under an Emergency Use Authorization (EUA). This EUA will remain in effect (meaning this test can be used) for the duration of the COVID-19 declaration under Section 564(b)(1) of the Act, 21 U.S.C. section 360bbb-3(b)(1), unless the authorization is terminated or revoked.     Resp Syncytial Virus by PCR NEGATIVE NEGATIVE Final    Comment: (NOTE) Fact Sheet for Patients: BloggerCourse.com  Fact Sheet for Healthcare Providers: SeriousBroker.it  This test is not yet approved or cleared by the [place] FDA  and has been authorized for detection and/or diagnosis of SARS-CoV-2 by FDA under an Emergency Use Authorization (EUA). This EUA will remain in effect (meaning this test can be used) for the duration of the COVID-19 declaration under Section 564(b)(1) of the Act, 21 U.S.C. section 360bbb-3(b)(1), unless the authorization is terminated or revoked.  Performed at [facility], [address] [place], [place] [address]   Blood culture (routine x 2)     Status: None (Preliminary result)   Collection Time: 04/25/22  2:09 PM   Specimen: BLOOD RIGHT FOREARM  Result Value Ref Range Status   Specimen Description   Final    BLOOD RIGHT FOREARM Performed at [facility], [address], [place], [place] [address]    Special Requests   Final    Blood Culture adequate volume BOTTLES DRAWN AEROBIC AND ANAEROBIC Performed at [facility], [address] [place], [place] [address]    Culture   Final    NO GROWTH 3 DAYS Performed at [facility], [address], [place], [place] [address]    Report Status PENDING  Incomplete  Blood culture (routine x 2)     Status: None (Preliminary result)   Collection Time: 04/25/22  2:32 PM   Specimen: BLOOD RIGHT HAND  Result Value Ref Range Status   Specimen Description   Final    BLOOD RIGHT HAND Performed at [facility], [address], [place], [place] [address]    Special Requests   Final    Blood Culture adequate volume BOTTLES DRAWN AEROBIC AND ANAEROBIC Performed at [facility], [address] [place], [place] [address]    Culture   Final    NO GROWTH 3 DAYS Performed at [facility], [address] [place], [place] [address]    Report Status PENDING  Incomplete  Respiratory (~20 pathogens) panel by PCR     Status: None   Collection Time: 04/25/22 11:30 PM   Specimen: Nasopharyngeal Swab; Respiratory  Result Value Ref Range Status   Adenovirus NOT DETECTED NOT DETECTED Final    Coronavirus 229E NOT DETECTED NOT DETECTED Final    Comment: (NOTE) The Coronavirus on the Respiratory Panel,  DOES NOT test for the novel  Coronavirus (2019 nCoV)    Coronavirus HKU1 NOT DETECTED NOT DETECTED Final   Coronavirus NL63 NOT DETECTED NOT DETECTED Final   Coronavirus OC43 NOT DETECTED NOT DETECTED Final   Metapneumovirus NOT DETECTED NOT DETECTED Final   Rhinovirus / Enterovirus NOT DETECTED NOT DETECTED Final   Influenza A NOT DETECTED NOT DETECTED Final   Influenza B NOT DETECTED NOT DETECTED Final   Parainfluenza Virus 1 NOT DETECTED NOT DETECTED Final   Parainfluenza Virus 2 NOT DETECTED NOT DETECTED Final   Parainfluenza Virus 3 NOT DETECTED NOT DETECTED Final   Parainfluenza Virus 4 NOT DETECTED NOT DETECTED Final   Respiratory Syncytial Virus NOT DETECTED NOT DETECTED Final   Bordetella pertussis NOT DETECTED NOT DETECTED Final   Bordetella Parapertussis NOT DETECTED NOT DETECTED Final   Chlamydophila pneumoniae NOT DETECTED NOT DETECTED Final   Mycoplasma pneumoniae NOT DETECTED NOT DETECTED Final    Comment: Performed at [facility], [address], [place], [place] [address]     Time coordinating discharge: Over 30 minutes  SIGNED:   Azucena Fallen, DO [facility] 04/28/2022, 8:20 AM Pager   If 7PM-7AM, please contact night-coverage [email or web address]

## 2022-04-30 DIAGNOSIS — A419 Sepsis, unspecified organism: Secondary | ICD-10-CM | POA: Diagnosis not present

## 2022-04-30 DIAGNOSIS — I10 Essential (primary) hypertension: Secondary | ICD-10-CM | POA: Diagnosis not present

## 2022-04-30 DIAGNOSIS — I2510 Atherosclerotic heart disease of native coronary artery without angina pectoris: Secondary | ICD-10-CM | POA: Diagnosis not present

## 2022-04-30 DIAGNOSIS — R739 Hyperglycemia, unspecified: Secondary | ICD-10-CM | POA: Diagnosis not present

## 2022-04-30 LAB — CULTURE, BLOOD (ROUTINE X 2)
Culture: NO GROWTH
Culture: NO GROWTH
Special Requests: ADEQUATE
Special Requests: ADEQUATE

## 2022-05-07 DIAGNOSIS — E119 Type 2 diabetes mellitus without complications: Secondary | ICD-10-CM | POA: Diagnosis not present

## 2022-05-23 DIAGNOSIS — E781 Pure hyperglyceridemia: Secondary | ICD-10-CM | POA: Diagnosis not present

## 2022-05-23 DIAGNOSIS — I10 Essential (primary) hypertension: Secondary | ICD-10-CM | POA: Diagnosis not present

## 2022-05-23 DIAGNOSIS — E119 Type 2 diabetes mellitus without complications: Secondary | ICD-10-CM | POA: Diagnosis not present

## 2022-05-23 DIAGNOSIS — I480 Paroxysmal atrial fibrillation: Secondary | ICD-10-CM | POA: Diagnosis not present

## 2022-05-26 DIAGNOSIS — M533 Sacrococcygeal disorders, not elsewhere classified: Secondary | ICD-10-CM | POA: Diagnosis not present

## 2022-06-13 ENCOUNTER — Other Ambulatory Visit: Payer: Self-pay

## 2022-06-13 ENCOUNTER — Emergency Department (HOSPITAL_BASED_OUTPATIENT_CLINIC_OR_DEPARTMENT_OTHER): Payer: BC Managed Care – PPO

## 2022-06-13 ENCOUNTER — Inpatient Hospital Stay (HOSPITAL_BASED_OUTPATIENT_CLINIC_OR_DEPARTMENT_OTHER)
Admission: EM | Admit: 2022-06-13 | Discharge: 2022-06-19 | DRG: 872 | Disposition: A | Discharge status: Home / Self Care | Payer: BC Managed Care – PPO | Attending: Internal Medicine | Admitting: Internal Medicine

## 2022-06-13 ENCOUNTER — Encounter (HOSPITAL_BASED_OUTPATIENT_CLINIC_OR_DEPARTMENT_OTHER): Payer: Self-pay | Admitting: Emergency Medicine

## 2022-06-13 DIAGNOSIS — Z79899 Other long term (current) drug therapy: Secondary | ICD-10-CM

## 2022-06-13 DIAGNOSIS — N179 Acute kidney failure, unspecified: Secondary | ICD-10-CM | POA: Diagnosis present

## 2022-06-13 DIAGNOSIS — G8929 Other chronic pain: Secondary | ICD-10-CM | POA: Diagnosis present

## 2022-06-13 DIAGNOSIS — G9340 Encephalopathy, unspecified: Secondary | ICD-10-CM | POA: Diagnosis not present

## 2022-06-13 DIAGNOSIS — R6520 Severe sepsis without septic shock: Secondary | ICD-10-CM | POA: Diagnosis present

## 2022-06-13 DIAGNOSIS — R9431 Abnormal electrocardiogram [ECG] [EKG]: Secondary | ICD-10-CM | POA: Diagnosis present

## 2022-06-13 DIAGNOSIS — E876 Hypokalemia: Secondary | ICD-10-CM | POA: Diagnosis present

## 2022-06-13 DIAGNOSIS — R918 Other nonspecific abnormal finding of lung field: Secondary | ICD-10-CM | POA: Diagnosis not present

## 2022-06-13 DIAGNOSIS — D61818 Other pancytopenia: Secondary | ICD-10-CM | POA: Diagnosis not present

## 2022-06-13 DIAGNOSIS — Z87891 Personal history of nicotine dependence: Secondary | ICD-10-CM | POA: Diagnosis not present

## 2022-06-13 DIAGNOSIS — M549 Dorsalgia, unspecified: Secondary | ICD-10-CM | POA: Diagnosis not present

## 2022-06-13 DIAGNOSIS — E785 Hyperlipidemia, unspecified: Secondary | ICD-10-CM | POA: Diagnosis present

## 2022-06-13 DIAGNOSIS — Z1152 Encounter for screening for COVID-19: Secondary | ICD-10-CM | POA: Diagnosis not present

## 2022-06-13 DIAGNOSIS — E871 Hypo-osmolality and hyponatremia: Secondary | ICD-10-CM | POA: Diagnosis present

## 2022-06-13 DIAGNOSIS — Z86010 Personal history of colonic polyps: Secondary | ICD-10-CM

## 2022-06-13 DIAGNOSIS — G4733 Obstructive sleep apnea (adult) (pediatric): Secondary | ICD-10-CM | POA: Diagnosis not present

## 2022-06-13 DIAGNOSIS — I959 Hypotension, unspecified: Secondary | ICD-10-CM | POA: Diagnosis present

## 2022-06-13 DIAGNOSIS — Z6841 Body Mass Index (BMI) 40.0 and over, adult: Secondary | ICD-10-CM | POA: Diagnosis not present

## 2022-06-13 DIAGNOSIS — R791 Abnormal coagulation profile: Secondary | ICD-10-CM | POA: Diagnosis not present

## 2022-06-13 DIAGNOSIS — I10 Essential (primary) hypertension: Secondary | ICD-10-CM | POA: Diagnosis present

## 2022-06-13 DIAGNOSIS — E860 Dehydration: Secondary | ICD-10-CM | POA: Diagnosis not present

## 2022-06-13 DIAGNOSIS — A419 Sepsis, unspecified organism: Secondary | ICD-10-CM | POA: Diagnosis not present

## 2022-06-13 DIAGNOSIS — I252 Old myocardial infarction: Secondary | ICD-10-CM

## 2022-06-13 DIAGNOSIS — J9811 Atelectasis: Secondary | ICD-10-CM | POA: Diagnosis not present

## 2022-06-13 DIAGNOSIS — E119 Type 2 diabetes mellitus without complications: Secondary | ICD-10-CM | POA: Diagnosis not present

## 2022-06-13 DIAGNOSIS — D500 Iron deficiency anemia secondary to blood loss (chronic): Secondary | ICD-10-CM | POA: Diagnosis not present

## 2022-06-13 DIAGNOSIS — Z7982 Long term (current) use of aspirin: Secondary | ICD-10-CM

## 2022-06-13 DIAGNOSIS — N400 Enlarged prostate without lower urinary tract symptoms: Secondary | ICD-10-CM | POA: Diagnosis present

## 2022-06-13 DIAGNOSIS — K529 Noninfective gastroenteritis and colitis, unspecified: Secondary | ICD-10-CM | POA: Diagnosis not present

## 2022-06-13 DIAGNOSIS — Z87442 Personal history of urinary calculi: Secondary | ICD-10-CM

## 2022-06-13 LAB — URINALYSIS, W/ REFLEX TO CULTURE (INFECTION SUSPECTED)
Bilirubin Urine: NEGATIVE
Glucose, UA: >=500 mg/dL — AB
Ketones, ur: NEGATIVE mg/dL
Leukocytes,Ua: NEGATIVE
Nitrite: NEGATIVE
Protein, ur: 100 mg/dL — AB
Specific Gravity, Urine: 1.025 (ref 1.005–1.030)
pH: 5.5 (ref 5.0–8.0)

## 2022-06-13 LAB — PROTIME-INR
INR: 1.4 — ABNORMAL HIGH (ref 0.8–1.2)
Prothrombin Time: 17.1 seconds — ABNORMAL HIGH (ref 11.4–15.2)

## 2022-06-13 LAB — CBC WITH DIFFERENTIAL/PLATELET
Abs Immature Granulocytes: 0.05 10*3/uL (ref 0.00–0.07)
Basophils Absolute: 0 10*3/uL (ref 0.0–0.1)
Basophils Relative: 0 %
Eosinophils Absolute: 0 10*3/uL (ref 0.0–0.5)
Eosinophils Relative: 0 %
HCT: 30.9 % — ABNORMAL LOW (ref 39.0–52.0)
Hemoglobin: 9.5 g/dL — ABNORMAL LOW (ref 13.0–17.0)
Immature Granulocytes: 1 %
Lymphocytes Relative: 4 %
Lymphs Abs: 0.4 10*3/uL — ABNORMAL LOW (ref 0.7–4.0)
MCH: 25.3 pg — ABNORMAL LOW (ref 26.0–34.0)
MCHC: 30.7 g/dL (ref 30.0–36.0)
MCV: 82.2 fL (ref 80.0–100.0)
Monocytes Absolute: 0.8 10*3/uL (ref 0.1–1.0)
Monocytes Relative: 8 %
Neutro Abs: 8.6 10*3/uL — ABNORMAL HIGH (ref 1.7–7.7)
Neutrophils Relative %: 87 %
Platelets: 164 10*3/uL (ref 150–400)
RBC: 3.76 MIL/uL — ABNORMAL LOW (ref 4.22–5.81)
RDW: 15.5 % (ref 11.5–15.5)
WBC: 9.9 10*3/uL (ref 4.0–10.5)
nRBC: 0 % (ref 0.0–0.2)

## 2022-06-13 LAB — COMPREHENSIVE METABOLIC PANEL
ALT: 26 U/L (ref 0–44)
AST: 59 U/L — ABNORMAL HIGH (ref 15–41)
Albumin: 3.5 g/dL (ref 3.5–5.0)
Alkaline Phosphatase: 54 U/L (ref 38–126)
Anion gap: 12 (ref 5–15)
BUN: 27 mg/dL — ABNORMAL HIGH (ref 8–23)
CO2: 20 mmol/L — ABNORMAL LOW (ref 22–32)
Calcium: 7.9 mg/dL — ABNORMAL LOW (ref 8.9–10.3)
Chloride: 101 mmol/L (ref 98–111)
Creatinine, Ser: 1.34 mg/dL — ABNORMAL HIGH (ref 0.61–1.24)
GFR, Estimated: 58 mL/min — ABNORMAL LOW (ref >60)
Glucose, Bld: 173 mg/dL — ABNORMAL HIGH (ref 70–99)
Potassium: 2.6 mmol/L — CL (ref 3.5–5.1)
Sodium: 133 mmol/L — ABNORMAL LOW (ref 135–145)
Total Bilirubin: 0.8 mg/dL (ref 0.3–1.2)
Total Protein: 7.4 g/dL (ref 6.5–8.1)

## 2022-06-13 LAB — RESP PANEL BY RT-PCR (RSV, FLU A&B, COVID)  RVPGX2
Influenza A by PCR: NEGATIVE
Influenza B by PCR: NEGATIVE
Resp Syncytial Virus by PCR: NEGATIVE
SARS Coronavirus 2 by RT PCR: NEGATIVE

## 2022-06-13 LAB — LACTIC ACID, PLASMA
Lactic Acid, Venous: 2.1 mmol/L (ref 0.5–1.9)
Lactic Acid, Venous: 7.9 mmol/L (ref 0.5–1.9)
Lactic Acid, Venous: 4.3 mmol/L (ref 0.5–1.9)
Lactic Acid, Venous: 1.1 mmol/L (ref 0.5–1.9)

## 2022-06-13 LAB — LIPASE, BLOOD: Lipase: 25 U/L (ref 11–51)

## 2022-06-13 LAB — MAGNESIUM: Magnesium: 2.1 mg/dL (ref 1.7–2.4)

## 2022-06-13 LAB — APTT: aPTT: 31 seconds (ref 24–36)

## 2022-06-13 MED ORDER — ACETAMINOPHEN 325 MG PO TABS
ORAL_TABLET | ORAL | Status: AC
  Filled 2022-06-13: qty 1

## 2022-06-13 MED ORDER — ENOXAPARIN SODIUM 80 MG/0.8ML IJ SOSY
75.0000 mg | PREFILLED_SYRINGE | INTRAMUSCULAR | Status: DC
  Administered 2022-06-14 – 2022-06-17 (×4): 75 mg via SUBCUTANEOUS
  Filled 2022-06-13 (×4): qty 0.8

## 2022-06-13 MED ORDER — PANTOPRAZOLE SODIUM 40 MG PO TBEC
40.0000 mg | DELAYED_RELEASE_TABLET | Freq: Every day | ORAL | Status: DC
  Administered 2022-06-14 – 2022-06-17 (×4): 40 mg via ORAL
  Filled 2022-06-13 (×4): qty 1

## 2022-06-13 MED ORDER — SODIUM CHLORIDE 0.9 % IV SOLN
2.0000 g | Freq: Three times a day (TID) | INTRAVENOUS | Status: DC
  Administered 2022-06-13 – 2022-06-15 (×5): 2 g via INTRAVENOUS
  Filled 2022-06-13 (×6): qty 12.5

## 2022-06-13 MED ORDER — OXYBUTYNIN CHLORIDE ER 5 MG PO TB24
10.0000 mg | ORAL_TABLET | Freq: Every day | ORAL | Status: DC
  Administered 2022-06-14 – 2022-06-18 (×5): 10 mg via ORAL
  Filled 2022-06-13 (×5): qty 2

## 2022-06-13 MED ORDER — LACTATED RINGERS IV BOLUS (SEPSIS)
1000.0000 mL | Freq: Once | INTRAVENOUS | Status: AC
  Administered 2022-06-13: 1000 mL via INTRAVENOUS

## 2022-06-13 MED ORDER — VANCOMYCIN HCL IN DEXTROSE 1-5 GM/200ML-% IV SOLN
1000.0000 mg | Freq: Once | INTRAVENOUS | Status: AC
  Administered 2022-06-13: 1000 mg via INTRAVENOUS
  Filled 2022-06-13: qty 200

## 2022-06-13 MED ORDER — VANCOMYCIN HCL 2000 MG/400ML IV SOLN
2000.0000 mg | INTRAVENOUS | Status: DC
  Filled 2022-06-13: qty 400

## 2022-06-13 MED ORDER — SODIUM CHLORIDE 0.9% FLUSH
3.0000 mL | Freq: Two times a day (BID) | INTRAVENOUS | Status: DC
  Administered 2022-06-14 – 2022-06-18 (×8): 3 mL via INTRAVENOUS

## 2022-06-13 MED ORDER — ACETAMINOPHEN 650 MG RE SUPP: 650.0000 mg | Freq: Four times a day (QID) | RECTAL | Status: DC | PRN

## 2022-06-13 MED ORDER — ICOSAPENT ETHYL 1 G PO CAPS
2.0000 g | ORAL_CAPSULE | Freq: Two times a day (BID) | ORAL | Status: DC
  Administered 2022-06-14 – 2022-06-18 (×10): 2 g via ORAL
  Filled 2022-06-13 (×11): qty 2

## 2022-06-13 MED ORDER — ACETAMINOPHEN 325 MG PO TABS
650.0000 mg | ORAL_TABLET | Freq: Once | ORAL | Status: AC
  Administered 2022-06-13: 650 mg via ORAL
  Filled 2022-06-13: qty 2

## 2022-06-13 MED ORDER — POTASSIUM CHLORIDE IN NACL 20-0.9 MEQ/L-% IV SOLN
INTRAVENOUS | Status: DC
  Filled 2022-06-13 (×5): qty 1000

## 2022-06-13 MED ORDER — POTASSIUM CHLORIDE CRYS ER 20 MEQ PO TBCR
40.0000 meq | EXTENDED_RELEASE_TABLET | Freq: Once | ORAL | Status: AC
  Administered 2022-06-13: 40 meq via ORAL
  Filled 2022-06-13: qty 2

## 2022-06-13 MED ORDER — POTASSIUM CHLORIDE 20 MEQ PO PACK
60.0000 meq | PACK | Freq: Once | ORAL | Status: AC
  Administered 2022-06-14: 60 meq via ORAL
  Filled 2022-06-13: qty 3

## 2022-06-13 MED ORDER — ASPIRIN 81 MG PO TBEC
81.0000 mg | DELAYED_RELEASE_TABLET | Freq: Every day | ORAL | Status: DC
  Administered 2022-06-14 – 2022-06-18 (×5): 81 mg via ORAL
  Filled 2022-06-13 (×5): qty 1

## 2022-06-13 MED ORDER — METRONIDAZOLE 500 MG/100ML IV SOLN
500.0000 mg | Freq: Once | INTRAVENOUS | Status: AC
  Administered 2022-06-13: 500 mg via INTRAVENOUS
  Filled 2022-06-13: qty 100

## 2022-06-13 MED ORDER — LACTATED RINGERS IV SOLN: INTRAVENOUS | Status: DC

## 2022-06-13 MED ORDER — LACTATED RINGERS IV BOLUS: 1000.0000 mL | Freq: Once | INTRAVENOUS | Status: DC

## 2022-06-13 MED ORDER — CHLORHEXIDINE GLUCONATE CLOTH 2 % EX PADS
6.0000 | MEDICATED_PAD | Freq: Every day | CUTANEOUS | Status: DC
  Administered 2022-06-14 – 2022-06-19 (×6): 6 via TOPICAL

## 2022-06-13 MED ORDER — BUPRENORPHINE 20 MCG/HR TD PTWK: 1.0000 | MEDICATED_PATCH | TRANSDERMAL | Status: DC

## 2022-06-13 MED ORDER — BUPRENORPHINE HCL 300 MCG BU FILM: 300.0000 ug | ORAL_FILM | Freq: Two times a day (BID) | BUCCAL | Status: DC

## 2022-06-13 MED ORDER — INSULIN ASPART 100 UNIT/ML IJ SOLN
0.0000 [IU] | Freq: Three times a day (TID) | INTRAMUSCULAR | Status: DC
  Administered 2022-06-15: 2 [IU] via SUBCUTANEOUS

## 2022-06-13 MED ORDER — ORAL CARE MOUTH RINSE: 15.0000 mL | OROMUCOSAL | Status: DC | PRN

## 2022-06-13 MED ORDER — INSULIN ASPART 100 UNIT/ML IJ SOLN: 0.0000 [IU] | Freq: Every day | INTRAMUSCULAR | Status: DC

## 2022-06-13 MED ORDER — SODIUM CHLORIDE 0.9 % IV SOLN
2.0000 g | Freq: Once | INTRAVENOUS | Status: AC
  Administered 2022-06-13: 2 g via INTRAVENOUS
  Filled 2022-06-13: qty 12.5

## 2022-06-13 MED ORDER — POTASSIUM CHLORIDE 10 MEQ/100ML IV SOLN
10.0000 meq | INTRAVENOUS | Status: AC
  Administered 2022-06-13 (×4): 10 meq via INTRAVENOUS
  Filled 2022-06-13 (×4): qty 100

## 2022-06-13 MED ORDER — PREGABALIN 75 MG PO CAPS
75.0000 mg | ORAL_CAPSULE | Freq: Two times a day (BID) | ORAL | Status: DC
  Administered 2022-06-14 – 2022-06-18 (×11): 75 mg via ORAL
  Filled 2022-06-13 (×11): qty 1

## 2022-06-13 MED ORDER — ACETAMINOPHEN 500 MG PO TABS
1000.0000 mg | ORAL_TABLET | Freq: Once | ORAL | Status: AC
  Administered 2022-06-13: 1000 mg via ORAL
  Filled 2022-06-13: qty 2

## 2022-06-13 MED ORDER — ATORVASTATIN CALCIUM 20 MG PO TABS
40.0000 mg | ORAL_TABLET | Freq: Every day | ORAL | Status: DC
  Administered 2022-06-14 – 2022-06-18 (×6): 40 mg via ORAL
  Filled 2022-06-13 (×5): qty 2
  Filled 2022-06-13: qty 1

## 2022-06-13 MED ORDER — ACETAMINOPHEN 325 MG PO TABS
650.0000 mg | ORAL_TABLET | Freq: Four times a day (QID) | ORAL | Status: DC | PRN
  Filled 2022-06-13: qty 2

## 2022-06-13 MED ORDER — LACTATED RINGERS IV BOLUS
1000.0000 mL | Freq: Once | INTRAVENOUS | Status: AC
  Administered 2022-06-13: 1000 mL via INTRAVENOUS

## 2022-06-13 NOTE — ED Provider Notes (Signed)
[place] [facility]PARTMENT AT [facility] Provider Note   CSN: [id number] Arrival date & time: 06/13/22  1207     History  Chief Complaint  Patient presents with   Nausea    [name] is a 67 y.o. male.  Patient is a 67 year old male with a past medical history of hypertension and diabetes presenting to the emergency department with fever, nausea, vomiting and diarrhea.  Patient is here with his wife who states that he started to have fevers last night with associated nausea, vomiting and diarrhea.  She states that this morning his fever spiked to 104 which prompted her to come to the emergency department.  She states that he commonly becomes altered with fevers.  She states that he has not been complaining of any abdominal pain, dysuria or hematuria or fevers.  She states that he appeared similar when he was admitted last hospitalization for presumed tickborne illness and his symptoms have resolved completely with the antibiotics.  The history is provided by the spouse. History limited by: Level 5 caveat for altered mental status.       Home Medications Prior to Admission medications   Medication Sig Start Date End Date Taking? Authorizing Provider  aspirin EC 81 MG tablet Take 81 mg by mouth daily. Swallow whole.   Yes [provider]  atorvastatin (LIPITOR) 40 MG tablet Take 40 mg by mouth at bedtime.   Yes [provider]  buprenorphine (BUTRANS) 20 MCG/HR PTWK Place 1 patch onto the skin every Monday.   Yes [provider]  Buprenorphine HCl (BELBUCA) 300 MCG FILM Place 300 mcg inside cheek every 12 (twelve) hours.   Yes [provider]  etodolac (LODINE) 400 MG tablet Take 400 mg by mouth 2 (two) times daily as needed for mild pain. 06/02/22  Yes [provider]  Ferrous Sulfate (IRON PO) Take 1 tablet by mouth daily.   Yes [provider]  fluticasone (FLONASE) 50 MCG/ACT nasal spray Place 1 spray into  both nostrils as needed for allergies.   Yes [provider]  metoprolol tartrate (LOPRESSOR) 25 MG tablet Take 12.5 mg by mouth 2 (two) times daily.   Yes [provider]  omeprazole (PRILOSEC) 40 MG capsule Take 40 mg by mouth daily before breakfast.   Yes [provider]  oxybutynin (DITROPAN-XL) 10 MG 24 hr tablet Take 10 mg by mouth in the morning.   Yes [provider]  oxyCODONE-acetaminophen (PERCOCET/ROXICET) 5-325 MG tablet Take 1 tablet by mouth as needed (for pain).   Yes [provider]  pregabalin (LYRICA) 75 MG capsule Take 75 mg by mouth in the morning, at noon, and at bedtime.   Yes [provider]  silver sulfADIAZINE (SILVADENE) 1 % cream Apply 1 Application topically daily as needed (for wound care).   Yes [provider]  triamterene-hydrochlorothiazide (MAXZIDE) 75-50 MG per tablet Take 1 tablet by mouth daily.   Yes [provider]  VASCEPA 1 g capsule Take 2 g by mouth in the morning and at bedtime.   Yes [provider]  Vitamin D, Ergocalciferol, (DRISDOL) 50000 UNITS CAPS capsule Take 50,000 Units by mouth every Sunday.   Yes [provider]  XIGDUO XR 05-998 MG TB24 Take 2 tablets by mouth in the morning.   Yes [provider]  doxycycline (VIBRAMYCIN) 100 MG capsule Take 1 capsule (100 mg total) by mouth 2 (two) times daily. Patient not taking: Reported on 06/13/2022 04/28/22  Azucena Fallen, MD      Allergies    Patient has no known allergies.    Review of Systems   Review of Systems  Physical Exam Updated Vital Signs BP 122/70   Pulse 92   Temp 100.2 F (37.9 C) (Oral)   Resp 18   Ht 5\' 11"  (1.803 m)   Wt (!) 156.5 kg   SpO2 95%   BMI 48.12 kg/m  Physical Exam Vitals and nursing note reviewed.  Constitutional:      General: He is not in acute distress.    Appearance: He is ill-appearing.     Comments: Drowsy but arousable to verbal stimuli  HENT:      Head: Normocephalic and atraumatic.     Nose: Nose normal.     Mouth/Throat:     Mouth: Mucous membranes are moist.     Pharynx: Oropharynx is clear.  Eyes:     Extraocular Movements: Extraocular movements intact.     Comments: Mildly pale conjunctive via  Cardiovascular:     Rate and Rhythm: Normal rate and regular rhythm.     Heart sounds: Normal heart sounds.  Pulmonary:     Effort: Pulmonary effort is normal.     Breath sounds: Normal breath sounds.  Abdominal:     General: Abdomen is flat.     Palpations: Abdomen is soft.     Tenderness: There is no abdominal tenderness.  Musculoskeletal:        General: Normal range of motion.     Cervical back: Normal range of motion and neck supple. No rigidity.  Skin:    General: Skin is warm and dry.  Neurological:     Mental Status: He is oriented to person, place, and time.     Sensory: No sensory deficit.     Motor: No weakness.     ED Results / Procedures / Treatments   Labs (all labs ordered are listed, but only abnormal results are displayed) Labs Reviewed  LACTIC ACID, PLASMA - Abnormal; Notable for the following components:      Result Value   Lactic Acid, Venous 2.1 (*)    All other components within normal limits  COMPREHENSIVE METABOLIC PANEL - Abnormal; Notable for the following components:   Sodium 133 (*)    Potassium 2.6 (*)    CO2 20 (*)    Glucose, Bld 173 (*)    BUN 27 (*)    Creatinine, Ser 1.34 (*)    Calcium 7.9 (*)    AST 59 (*)    GFR, Estimated 58 (*)    All other components within normal limits  CBC WITH DIFFERENTIAL/PLATELET - Abnormal; Notable for the following components:   RBC 3.76 (*)    Hemoglobin 9.5 (*)    HCT 30.9 (*)    MCH 25.3 (*)    Neutro Abs 8.6 (*)    Lymphs Abs 0.4 (*)    All other components within normal limits  PROTIME-INR - Abnormal; Notable for the following components:   Prothrombin Time 17.1 (*)    INR 1.4 (*)    All other components within normal limits  RESP  PANEL BY RT-PCR (RSV, FLU A&B, COVID)  RVPGX2  CULTURE, BLOOD (ROUTINE X 2)  CULTURE, BLOOD (ROUTINE X 2)  APTT  LIPASE, BLOOD  MAGNESIUM  LACTIC ACID, PLASMA  URINALYSIS, W/ REFLEX TO CULTURE (INFECTION SUSPECTED)    EKG EKG Interpretation  Date/Time:  Friday June 13 2022 12:48:33 EDT Ventricular Rate:  95 PR Interval:  143 QRS Duration: 107 QT Interval:  448 QTC Calculation: 564 R Axis:   91 Text Interpretation: Sinus rhythm Right axis deviation Borderline ST depression, diffuse leads Minimal ST elevation, lateral leads Prolonged QT interval Otherwise no significant change Confirmed by Elayne Snare ([id number]) on 06/13/2022 12:59:17 PM  Radiology DG Chest Port 1 View  Result Date: 06/13/2022 CLINICAL DATA:  Questionable sepsis - evaluate for abnormality EXAM: PORTABLE CHEST 1 VIEW COMPARISON:  April 19, 24. FINDINGS: Limited low lung volume study with streaky left basilar opacities. Right lung is clear. No visible effusions or pneumothorax. Cardiomediastinal silhouette is accentuated by technique. 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 IMPRESSION: Limited study with mild streaky left basilar opacities, favor atelectasis. Dedicated PA and lateral chest comparison is warranted. Electronically Signed   By: Feliberto Harts M.D.   On: 06/13/2022 13:36    Procedures .Critical Care  Performed by: Rexford Maus, DO Authorized by: Rexford Maus, DO   Critical care provider statement:    Critical care time (minutes):  40   Critical care time was exclusive of:  Separately billable procedures and treating other patients   Critical care was necessary to treat or prevent imminent or life-threatening deterioration of the following conditions:  Sepsis   Critical care was time spent personally by me on the following activities:  Development of treatment plan with patient or surrogate, discussions with primary provider, evaluation of patient's response to  treatment, examination of patient, obtaining history from patient or surrogate, ordering and performing treatments and interventions, ordering and review of radiographic studies, ordering and review of laboratory studies, pulse oximetry, re-evaluation of patient's condition and review of old charts   I assumed direction of critical care for this patient from another provider in my specialty: no   Ultrasound ED Peripheral IV (Provider)  Date/Time: 06/13/2022 3:00 PM  Performed by: Rexford Maus, DO Authorized by: Rexford Maus, DO   Procedure details:    Indications: multiple failed IV attempts and poor IV access     Skin Prep: chlorhexidine gluconate     Location:  Right forearm   Angiocath:  20 G   Bedside Ultrasound Guided: Yes     Images: not archived     Patient tolerated procedure without complications: Yes     Dressing applied: Yes       Medications Ordered in ED Medications  lactated ringers infusion ( Intravenous New Bag/Given 06/13/22 1325)  vancomycin (VANCOCIN) IVPB 1000 mg/200 mL premix (1,000 mg Intravenous New Bag/Given 06/13/22 1505)  vancomycin (VANCOCIN) IVPB 1000 mg/200 mL premix (1,000 mg Intravenous New Bag/Given 06/13/22 1506)  acetaminophen (TYLENOL) 325 MG tablet (  Canceled Entry 06/13/22 1349)  potassium chloride 10 mEq in 100 mL IVPB (10 mEq Intravenous New Bag/Given 06/13/22 1513)  vancomycin (VANCOREADY) IVPB 2000 mg/400 mL (has no administration in time range)  ceFEPIme (MAXIPIME) 2 g in sodium chloride 0.9 % 100 mL IVPB (has no administration in time range)  lactated ringers bolus 1,000 mL (0 mLs Intravenous Stopped 06/13/22 1428)  ceFEPIme (MAXIPIME) 2 g in sodium chloride 0.9 % 100 mL IVPB (0 g Intravenous Stopped 06/13/22 1402)  metroNIDAZOLE (FLAGYL) IVPB 500 mg (0 mg Intravenous Stopped 06/13/22 1510)  acetaminophen (TYLENOL) tablet 650 mg (650 mg Oral Given 06/13/22 1329)  potassium chloride SA (KLOR-CON M) CR tablet 40 mEq (40 mEq Oral Given 06/13/22  1508)  lactated ringers bolus 1,000 mL (1,000 mLs Intravenous New Bag/Given 06/13/22 1509)  ED Course/ Medical Decision Making/ A&P Clinical Course as of 06/13/22 1523  Fri Jun 13, 2022  1420 Labs with severe hypokalemia likely causing QT prolongation on EKG. He will be repleted. Mildly elevated lactic and mild AKI and is receiving IVF. Anemia at baseline, Possible atelectasis vs pneumonia on CXR. [VK]  1517 Stable  67 YOM with a chief complaint of N/V/D  Poor PO intake and sick contacts.  Sepsis activated, K low, QTC prolonged, lactic up, CXR with nonspecific changes Admitted. [CC]    Clinical Course User Index [CC] Glyn Ade, MD [VK] Rexford Maus, DO                             Medical Decision Making This patient presents to the ED with chief complaint(s) of fever, N/V/D with pertinent past medical history of HTN, DM which further complicates the presenting complaint. The complaint involves an extensive differential diagnosis and also carries with it a high risk of complications and morbidity.    The differential diagnosis includes sepsis, dehydration, electrolyte abnormality, pancreatitis, hepatitis, gastroenteritis, pneumonia, UTI, considering meningitis with altered mental status however no meningismus or headaches on exam making this less likely, no evidence of rash or skin infection on exam  Additional history obtained: Additional history obtained from spouse Records reviewed previous admission documents  ED Course and Reassessment: On patient's arrival to the emergency department he was febrile, mildly tachycardic and mildly tachypneic though satting well on room air.  He is drowsy and altered but following commands in all 4 extremities and is declining any pain at this time.  EKG performed on arrival showed normal sinus rhythm with prolonged QT interval otherwise no acute ischemic changes.  He was given Tylenol for his fever and has been started on a sepsis  workup and will be closely reassessed.  Independent labs interpretation:  The following labs were independently interpreted: hypokalemia 2.5, mild AKI Cr 1.3 from baseline normal, mild lactate elevation  Independent visualization of imaging: - I independently visualized the following imaging with scope of interpretation limited to determining acute life threatening conditions related to emergency care: CXR, which revealed basilar atelectasis   Consultation: - Consulted or discussed management/test interpretation w/ external professional: hospitalist  Consideration for admission or further workup: patient requires admission for his AMS and severe hypokalemia likely in the setting of gastroenteritis Social Determinants of health: N/A    Amount and/or Complexity of Data Reviewed Labs: ordered. Radiology: ordered. ECG/medicine tests: ordered.  Risk OTC drugs. Prescription drug management. Decision regarding hospitalization.          Final Clinical Impression(s) / ED Diagnoses Final diagnoses:  Hypokalemia  Prolonged Q-T interval on ECG  Sepsis with acute renal failure without septic shock, due to unspecified organism, unspecified acute renal failure type (HCC)  Mild basilar atelectasis of both lungs  Gastroenteritis    Rx / DC Orders ED Discharge Orders     None         Rexford Maus, DO 06/13/22 1523

## 2022-06-13 NOTE — Sepsis Progress Note (Signed)
Secure chat with bedside RN Victorino Dike to see if nurse and provider aware of repeat lactic. Provider planning to address fluid bolus

## 2022-06-13 NOTE — Assessment & Plan Note (Signed)
Please reorder anti HTN agent in AM as appropriate.

## 2022-06-13 NOTE — Assessment & Plan Note (Signed)
Patient has a diagnosis of QT prolongation.  Current EKG does show some QT prolongation but it is in the setting of rather severe hypokalemia.  Therefore my treatment is going to be aggressive correction of hypokalemia and then I am going to check QT again tomorrow morning.  And then go from there.  I do not favor stopping patient's chronic pain medication at this time as this will definitely cause clinical trouble for patient.

## 2022-06-13 NOTE — Plan of Care (Signed)
Called for Direct Admit from [facility] by Dr Ivan Croft.   67 y/o male with DM & HTN. Present for fever, nausea, vomiting and diarrhea with somnolence. No abdominal pain. K 2.6. Prolonged QT. Cr 1.3 from normal. LA 2.1.  Temp noted to be 103.2. Given IVF, 2 L and broad spectrum antibiotics. Grandchildren have similar GI symptoms.  KCL 40 IV and 40 PO given.  Have accepted him to a telemetry bed.   Leonard Cantor, MD [facility]

## 2022-06-13 NOTE — ED Notes (Signed)
[facility] called for transport. 

## 2022-06-13 NOTE — Assessment & Plan Note (Signed)
This was severe potassium of 2.6.  Patient received 40 mEq of KCl orally and 10 mg IV.  Given the degree of patient's hypokalemia, I think patient can safely give him another 60 mg of PO ACL right now.  Potassium level stat now and then proceed accordingly.

## 2022-06-13 NOTE — H&P (Signed)
History and Physical    Patient: [name] WUJ:[id number] DOB: [birth year] DOA: 06/13/2022 DOS: the patient was seen and examined on 06/13/2022 PCP: Eunice Blase, PA-C  Patient coming from: Home  Chief Complaint:  Chief Complaint  Patient presents with   Nausea   HPI: [name] is a 67 y.o. male with medical history significant of chronic pain issues for which he takes buprenorphine pen.  Patient was in his usual state of health till 2 days ago when he reports new onset of "stomach upset "associated with multiple 3-4 bowel movements in a day which were small volume formed/semiformed.  But certainly no large-volume diarrhea.  This was not associated with any abdominal pain nausea or vomiting.  However was associated with fever up to 104 F at home.  Finally last evening patient had mild confusion with inability to do simple activities such as putting on his shoes.  This is as per what the patient's wife told the patient.  Patient reports that he does not have much recollection of last evening.  There is no report of patient having passed out having had tremors or seizures or incontinence.  Patient was finally brought to outside ER affiliated with [facility] and found to have sepsis.  Patient is s/p IV fluids and IV antibiotics.  At this time patient is feeling remarkably better and offers no complaints except for his chronic low back pain which is unchanged. Review of Systems: As mentioned in the history of present illness. All other systems reviewed and are negative. Past Medical History:  Diagnosis Date   Chronic back pain    HNP/stenosis   Degenerative disk disease    Depression    Difficult intubation    dx 05/14/07 [facility]); has anterior larynx, small mouth; glidescope #3 used in 2012 ([facility])   Enlarged prostate    slightly   History of colon polyps    History of kidney stones    Hyperlipidemia    takes Atorvastatin daily   Hypertension    takes Maxzide and Lisinopril daily as well as  Bystolic   Joint pain    Joint swelling    Myocardial infarct (HCC) 1999   OSA (obstructive sleep apnea) 04/25/2022   Vitamin D deficiency    takes Vit D on Weds   Past Surgical History:  Procedure Laterality Date   ACHILLES TENDON SURGERY Left    reattached    BACK SURGERY     x 2   CARPAL TUNNEL RELEASE Right 2006   had nerve impingement   COLONOSCOPY     ELBOW SURGERY Bilateral    ESOPHAGOGASTRODUODENOSCOPY     JOINT REPLACEMENT Bilateral 2012   (R) 03/2000: (L) 10/12   LAMINECTOMY  09/19/2013   L3  L4    DR CRAM    SHOULDER ARTHROSCOPY WITH ROTATOR CUFF REPAIR Right 01/2012   TONSILLECTOMY     Social History:  reports that he quit smoking about 24 years ago. His smoking use included cigarettes. He has a 7.00 pack-year smoking history. He has never used smokeless tobacco. He reports that he does not drink alcohol and does not use drugs.  No Known Allergies  History reviewed. No pertinent family history.  Prior to Admission medications   Medication Sig Start Date End Date Taking? Authorizing Provider  aspirin EC 81 MG tablet Take 81 mg by mouth daily. Swallow whole.   Yes [provider]  atorvastatin (LIPITOR) 40 MG tablet Take 40 mg by mouth at bedtime.   Yes  [provider]  buprenorphine Lavera Guise) 20 MCG/HR PTWK Place 1 patch onto the skin every Monday.   Yes [provider]  Buprenorphine HCl (BELBUCA) 300 MCG FILM Place 300 mcg inside cheek every 12 (twelve) hours.   Yes [provider]  etodolac (LODINE) 400 MG tablet Take 400 mg by mouth 2 (two) times daily as needed for mild pain. 06/02/22  Yes [provider]  Ferrous Sulfate (IRON PO) Take 1 tablet by mouth daily.   Yes [provider]  fluticasone (FLONASE) 50 MCG/ACT nasal spray Place 1 spray into both nostrils as needed for allergies.   Yes [provider]  metoprolol tartrate (LOPRESSOR) 25 MG tablet Take 12.5 mg by mouth 2 (two) times daily.   Yes  [provider]  omeprazole (PRILOSEC) 40 MG capsule Take 40 mg by mouth daily before breakfast.   Yes [provider]  oxybutynin (DITROPAN-XL) 10 MG 24 hr tablet Take 10 mg by mouth in the morning.   Yes [provider]  oxyCODONE-acetaminophen (PERCOCET/ROXICET) 5-325 MG tablet Take 1 tablet by mouth as needed (for pain).   Yes [provider]  pregabalin (LYRICA) 75 MG capsule Take 75 mg by mouth in the morning, at noon, and at bedtime.   Yes [provider]  silver sulfADIAZINE (SILVADENE) 1 % cream Apply 1 Application topically daily as needed (for wound care).   Yes [provider]  triamterene-hydrochlorothiazide (MAXZIDE) 75-50 MG per tablet Take 1 tablet by mouth daily.   Yes [provider]  VASCEPA 1 g capsule Take 2 g by mouth in the morning and at bedtime.   Yes [provider]  Vitamin D, Ergocalciferol, (DRISDOL) 50000 UNITS CAPS capsule Take 50,000 Units by mouth every Sunday.   Yes [provider]  XIGDUO XR 05-998 MG TB24 Take 2 tablets by mouth in the morning.   Yes [provider]  doxycycline (VIBRAMYCIN) 100 MG capsule Take 1 capsule (100 mg total) by mouth 2 (two) times daily. Patient not taking: Reported on 06/13/2022 04/28/22   Azucena Fallen, MD    Physical Exam: Vitals:   06/13/22 1715 06/13/22 1902 06/13/22 1945 06/13/22 2115  BP: (!) 111/58 (!) 101/53 (!) 116/57 116/63  Pulse: 84 79 79 76  Resp: 19 18 16 19   Temp:  98.2 F (36.8 C)    TempSrc:  Oral    SpO2: 95% 95% 100% 97%  Weight:      Height:       This gentleman does not appear to be in any distress Respiratory exam: Bilateral intravesicular Cardiovascular exam S1-S2 normal Abdomen all quadrants soft Extremities warm without edema no focal motor deficit Patient is alert awake giving a full coherent account of his last few days as well as past medical history. Data Reviewed:  Labs on Admission:  Results  for orders placed or performed during the hospital encounter of 06/13/22 (from the past 24 hour(s))  Resp panel by RT-PCR (RSV, Flu A&B, Covid)     Status: None   Collection Time: 06/13/22  1:06 PM   Specimen: Nasal Swab  Result Value Ref Range   SARS Coronavirus 2 by RT PCR NEGATIVE NEGATIVE   Influenza A by PCR NEGATIVE NEGATIVE   Influenza B by PCR NEGATIVE NEGATIVE   Resp Syncytial Virus by PCR NEGATIVE NEGATIVE  Lactic acid, plasma     Status: Abnormal   Collection Time: 06/13/22  1:06 PM  Result Value Ref Range   Lactic  Acid, Venous 2.1 (HH) 0.5 - 1.9 mmol/L  Comprehensive metabolic panel     Status: Abnormal   Collection Time: 06/13/22  1:06 PM  Result Value Ref Range   Sodium 133 (L) 135 - 145 mmol/L   Potassium 2.6 (LL) 3.5 - 5.1 mmol/L   Chloride 101 98 - 111 mmol/L   CO2 20 (L) 22 - 32 mmol/L   Glucose, Bld 173 (H) 70 - 99 mg/dL   BUN 27 (H) 8 - 23 mg/dL   Creatinine, Ser 8.11 (H) 0.61 - 1.24 mg/dL   Calcium 7.9 (L) 8.9 - 10.3 mg/dL   Total Protein 7.4 6.5 - 8.1 g/dL   Albumin 3.5 3.5 - 5.0 g/dL   AST 59 (H) 15 - 41 U/L   ALT 26 0 - 44 U/L   Alkaline Phosphatase 54 38 - 126 U/L   Total Bilirubin 0.8 0.3 - 1.2 mg/dL   GFR, Estimated 58 (L) >60 mL/min   Anion gap 12 5 - 15  CBC with Differential     Status: Abnormal   Collection Time: 06/13/22  1:06 PM  Result Value Ref Range   WBC 9.9 4.0 - 10.5 K/uL   RBC 3.76 (L) 4.22 - 5.81 MIL/uL   Hemoglobin 9.5 (L) 13.0 - 17.0 g/dL   HCT 91.4 (L) 78.2 - 95.6 %   MCV 82.2 80.0 - 100.0 fL   MCH 25.3 (L) 26.0 - 34.0 pg   MCHC 30.7 30.0 - 36.0 g/dL   RDW 21.3 08.6 - 57.8 %   Platelets 164 150 - 400 K/uL   nRBC 0.0 0.0 - 0.2 %   Neutrophils Relative % 87 %   Neutro Abs 8.6 (H) 1.7 - 7.7 K/uL   Lymphocytes Relative 4 %   Lymphs Abs 0.4 (L) 0.7 - 4.0 K/uL   Monocytes Relative 8 %   Monocytes Absolute 0.8 0.1 - 1.0 K/uL   Eosinophils Relative 0 %   Eosinophils Absolute 0.0 0.0 - 0.5 K/uL   Basophils Relative 0 %    Basophils Absolute 0.0 0.0 - 0.1 K/uL   Immature Granulocytes 1 %   Abs Immature Granulocytes 0.05 0.00 - 0.07 K/uL  Protime-INR     Status: Abnormal   Collection Time: 06/13/22  1:06 PM  Result Value Ref Range   Prothrombin Time 17.1 (H) 11.4 - 15.2 seconds   INR 1.4 (H) 0.8 - 1.2  APTT     Status: None   Collection Time: 06/13/22  1:06 PM  Result Value Ref Range   aPTT 31 24 - 36 seconds  Lipase, blood     Status: None   Collection Time: 06/13/22  1:06 PM  Result Value Ref Range   Lipase 25 11 - 51 U/L  Magnesium     Status: None   Collection Time: 06/13/22  1:06 PM  Result Value Ref Range   Magnesium 2.1 1.7 - 2.4 mg/dL  Lactic acid, plasma     Status: Abnormal   Collection Time: 06/13/22  4:48 PM  Result Value Ref Range   Lactic Acid, Venous 7.9 (HH) 0.5 - 1.9 mmol/L  Urinalysis, w/ Reflex to Culture (Infection Suspected) -Urine, Clean Catch     Status: Abnormal   Collection Time: 06/13/22  5:16 PM  Result Value Ref Range   Specimen Source URINE, CLEAN CATCH    Color, Urine YELLOW YELLOW   APPearance CLEAR CLEAR   Specific Gravity, Urine 1.025 1.005 - 1.030   pH 5.5 5.0 - 8.0  Glucose, UA >=500 (A) NEGATIVE mg/dL   Hgb urine dipstick LARGE (A) NEGATIVE   Bilirubin Urine NEGATIVE NEGATIVE   Ketones, ur NEGATIVE NEGATIVE mg/dL   Protein, ur 540 (A) NEGATIVE mg/dL   Nitrite NEGATIVE NEGATIVE   Leukocytes,Ua NEGATIVE NEGATIVE   Squamous Epithelial / HPF 0-5 0 - 5 /HPF   WBC, UA 0-5 0 - 5 WBC/hpf   RBC / HPF 0-5 0 - 5 RBC/hpf   Bacteria, UA MANY (A) NONE SEEN   Mucus PRESENT   Lactic acid, plasma     Status: Abnormal   Collection Time: 06/13/22  7:56 PM  Result Value Ref Range   Lactic Acid, Venous 4.3 (HH) 0.5 - 1.9 mmol/L  Lactic acid, plasma     Status: None   Collection Time: 06/13/22 10:56 PM  Result Value Ref Range   Lactic Acid, Venous 1.1 0.5 - 1.9 mmol/L   Basic Metabolic Panel: Recent Labs  Lab 06/13/22 1306  NA 133*  K 2.6*  CL 101  CO2 20*   GLUCOSE 173*  BUN 27*  CREATININE 1.34*  CALCIUM 7.9*  MG 2.1   Liver Function Tests: Recent Labs  Lab 06/13/22 1306  AST 59*  ALT 26  ALKPHOS 54  BILITOT 0.8  PROT 7.4  ALBUMIN 3.5   Recent Labs  Lab 06/13/22 1306  LIPASE 25   No results for input(s): "AMMONIA" in the last 168 hours. CBC: Recent Labs  Lab 06/13/22 1306  WBC 9.9  NEUTROABS 8.6*  HGB 9.5*  HCT 30.9*  MCV 82.2  PLT 164   Cardiac Enzymes: No results for input(s): "CKTOTAL", "CKMB", "CKMBINDEX", "TROPONINIHS" in the last 168 hours.  BNP (last 3 results) No results for input(s): "PROBNP" in the last 8760 hours. CBG: No results for input(s): "GLUCAP" in the last 168 hours.  Radiological Exams on Admission:  DG Chest Port 1 View  Result Date: 06/13/2022 CLINICAL DATA:  Questionable sepsis - evaluate for abnormality EXAM: PORTABLE CHEST 1 VIEW COMPARISON:  April 19, 24. FINDINGS: Limited low lung volume study with streaky left basilar opacities. Right lung is clear. No visible effusions or pneumothorax. Cardiomediastinal silhouette is accentuated by technique. 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 IMPRESSION: Limited study with mild streaky left basilar opacities, favor atelectasis. Dedicated PA and lateral chest comparison is warranted. Electronically Signed   By: Feliberto Harts M.D.   On: 06/13/2022 13:36    EKG: Independently reviewed. NSR   Assessment and Plan: Hypotension Please reorder anti HTN agent in AM as appropriate.  Hypokalemia This was severe potassium of 2.6.  Patient received 40 mEq of KCl orally and 10 mg IV.  Given the degree of patient's hypokalemia, I think patient can safely give him another 60 mg of PO ACL right now.  Potassium level stat now and then proceed accordingly.  QT prolongation Patient has a diagnosis of QT prolongation.  Current EKG does show some QT prolongation but it is in the setting of rather severe hypokalemia.  Therefore my  treatment is going to be aggressive correction of hypokalemia and then I am going to check QT again tomorrow morning.  And then go from there.  I do not favor stopping patient's chronic pain medication at this time as this will definitely cause clinical trouble for patient.  Sepsis (HCC) Testing as fever, mild lactic acidosis.  Questionable AKI.  Source at this time is not entirely clear to me.  Patient's diarrhea seems to be rather nonspecific and fever  related.  At this time we will follow-up blood cultures.  Continue treatment with cefepime and IV fluids.  Patient has improved in terms of his lactic acidosis fever and mentation as well.      Advance Care Planning:   Code Status: Full Code   Consults: none  Family Communication: patinet wife brought patinet to hospital per pateint.  Severity of Illness: The appropriate patient status for this patient is INPATIENT. Inpatient status is judged to be reasonable and necessary in order to provide the required intensity of service to ensure the patient's safety. The patient's presenting symptoms, physical exam findings, and initial radiographic and laboratory data in the context of their chronic comorbidities is felt to place them at high risk for further clinical deterioration. Furthermore, it is not anticipated that the patient will be medically stable for discharge from the hospital within 2 midnights of admission.   * I certify that at the point of admission it is my clinical judgment that the patient will require inpatient hospital care spanning beyond 2 midnights from the point of admission due to high intensity of service, high risk for further deterioration and high frequency of surveillance required.*  Author: Nolberto Hanlon, MD 06/13/2022 11:50 PM  For on call review [email or web address].

## 2022-06-13 NOTE — Progress Notes (Signed)
Pharmacy Antibiotic Note  [name] is a 67 y.o. male admitted on 06/13/2022 with sepsis.  Pharmacy has been consulted for vancomycin and cefepime dosing. Pt is febrile with Tmax 103.2 and WBC is WNL. SCr is above baseline at 1.34 and lactic acid is elevated at 2.1.   Plan: Vancomycin 2g IV Q24H  Cefepime 2g IV Q8H F/u renal fxn, C&S, clinical status and peak/trough at SS  Height: 5\' 11"  (180.3 cm) Weight: (!) 156.5 kg (345 lb 0.3 oz) IBW/kg (Calculated) : 75.3  Temp (24hrs), Avg:103.2 F (39.6 C), Min:103.2 F (39.6 C), Max:103.2 F (39.6 C)  Recent Labs  Lab 06/13/22 1306  WBC 9.9  CREATININE 1.34*  LATICACIDVEN 2.1*    Estimated Creatinine Clearance: 82.7 mL/min (A) (by C-G formula based on SCr of 1.34 mg/dL (H)).    No Known Allergies  Antimicrobials this admission: Vanc 6/7>> Cefepime 6/7>> Flagyl x 1 6/7  Dose adjustments this admission: N/A  Microbiology results: Pending  Thank you for allowing pharmacy to be a part of this patient's care.  Seham Gardenhire, Drake Leach 06/13/2022 1:04 PM

## 2022-06-13 NOTE — Sepsis Progress Note (Signed)
Elink will follow per sepsis protocol  

## 2022-06-13 NOTE — Assessment & Plan Note (Addendum)
presenting as fever, mild lactic acidosis.  Questionable AKI.  Source at this time is not entirely clear to me.  Patient's diarrhea seems to be rather nonspecific and fever related (regardless, patient has not had diarrhea amenable to sampling since arrival in ER/Hospital).  At this time we will follow-up blood cultures.  Continue treatment with cefepime and IV fluids.  Patient has improved in terms of his lactic acidosis fever and mentation as well.

## 2022-06-13 NOTE — ED Triage Notes (Signed)
N/v/d since last night just admitted for sepsis a month ago  temp 103   last tylenol last night

## 2022-06-13 NOTE — Assessment & Plan Note (Addendum)
hOld patient metformin.  Treat with insulin for now

## 2022-06-14 DIAGNOSIS — K529 Noninfective gastroenteritis and colitis, unspecified: Secondary | ICD-10-CM | POA: Diagnosis not present

## 2022-06-14 LAB — GLUCOSE, CAPILLARY
Glucose-Capillary: 109 mg/dL — ABNORMAL HIGH (ref 70–99)
Glucose-Capillary: 115 mg/dL — ABNORMAL HIGH (ref 70–99)
Glucose-Capillary: 110 mg/dL — ABNORMAL HIGH (ref 70–99)
Glucose-Capillary: 105 mg/dL — ABNORMAL HIGH (ref 70–99)
Glucose-Capillary: 100 mg/dL — ABNORMAL HIGH (ref 70–99)

## 2022-06-14 LAB — CBC
HCT: 27.7 % — ABNORMAL LOW (ref 39.0–52.0)
Hemoglobin: 8.4 g/dL — ABNORMAL LOW (ref 13.0–17.0)
MCH: 25.3 pg — ABNORMAL LOW (ref 26.0–34.0)
MCHC: 30.3 g/dL (ref 30.0–36.0)
MCV: 83.4 fL (ref 80.0–100.0)
Platelets: 130 10*3/uL — ABNORMAL LOW (ref 150–400)
RBC: 3.32 MIL/uL — ABNORMAL LOW (ref 4.22–5.81)
RDW: 15.4 % (ref 11.5–15.5)
WBC: 6.5 10*3/uL (ref 4.0–10.5)
nRBC: 0 % (ref 0.0–0.2)

## 2022-06-14 LAB — GASTROINTESTINAL PANEL BY PCR, STOOL (REPLACES STOOL CULTURE)

## 2022-06-14 LAB — PROTIME-INR
INR: 1.4 — ABNORMAL HIGH (ref 0.8–1.2)
Prothrombin Time: 17.2 seconds — ABNORMAL HIGH (ref 11.4–15.2)

## 2022-06-14 LAB — BASIC METABOLIC PANEL
Anion gap: 8 (ref 5–15)
BUN: 26 mg/dL — ABNORMAL HIGH (ref 8–23)
CO2: 21 mmol/L — ABNORMAL LOW (ref 22–32)
Calcium: 7.9 mg/dL — ABNORMAL LOW (ref 8.9–10.3)
Chloride: 105 mmol/L (ref 98–111)
Creatinine, Ser: 0.99 mg/dL (ref 0.61–1.24)
GFR, Estimated: >60 mL/min (ref >60)
Glucose, Bld: 110 mg/dL — ABNORMAL HIGH (ref 70–99)
Potassium: 3.5 mmol/L (ref 3.5–5.1)
Sodium: 134 mmol/L — ABNORMAL LOW (ref 135–145)

## 2022-06-14 LAB — C DIFFICILE QUICK SCREEN W PCR REFLEX
C Diff antigen: NEGATIVE
C Diff interpretation: NOT DETECTED
C Diff toxin: NEGATIVE

## 2022-06-14 LAB — CULTURE, BLOOD (ROUTINE X 2)

## 2022-06-14 LAB — APTT: aPTT: 33 seconds (ref 24–36)

## 2022-06-14 MED ORDER — BUPRENORPHINE HCL 300 MCG BU FILM: 300.0000 ug | ORAL_FILM | Freq: Two times a day (BID) | BUCCAL | Status: DC | PRN

## 2022-06-14 MED ORDER — VANCOMYCIN HCL 1250 MG/250ML IV SOLN
1250.0000 mg | Freq: Two times a day (BID) | INTRAVENOUS | Status: DC
  Administered 2022-06-14: 1250 mg via INTRAVENOUS
  Filled 2022-06-14: qty 250

## 2022-06-14 MED ORDER — ENSURE ENLIVE PO LIQD
237.0000 mL | Freq: Two times a day (BID) | ORAL | Status: DC
  Administered 2022-06-14 – 2022-06-18 (×6): 237 mL via ORAL

## 2022-06-14 MED ORDER — BUPRENORPHINE 20 MCG/HR TD PTWK
1.0000 | MEDICATED_PATCH | TRANSDERMAL | Status: DC
  Administered 2022-06-14: 1 via TRANSDERMAL

## 2022-06-14 MED ORDER — BUPRENORPHINE 20 MCG/HR TD PTWK: 1.0000 | MEDICATED_PATCH | TRANSDERMAL | Status: DC

## 2022-06-14 NOTE — Progress Notes (Signed)
[facility] Progress Note  Patient: [name]     VWU:[id number]  DOA: 06/13/2022   PCP: Eunice Blase, PA-C       Brief hospital course: This is a 67 year old male with history of chronic pain, diabetes mellitus and obesity who presents to the hospital with a fever and diarrhea. The patient states that he could not remember the events from Thursday night until Friday afternoon.  His wife has told him that he had a temperature of 104 degrees.  He admits to 2 days of diarrhea with a runny stool and about 5 episodes a day.  He states that his grandkids have had the same and feels that he has caught it from them. He presented to the hospital with confusion and was noted to have a potassium level of 2.6, sodium 133 and creatinine 1.34. Temperature was noted to be 103.2, respiratory rate in the 20s.  WBC count was normal at 9.9. Lactic acid was initially elevated at 2.1, then 7.9 and slowly improved down to 1.1.  He was admitted in April for fever and resultant acute encephalopathy.  He was treated with IV antibiotics and subsequently placed on doxycycline.  No cause was found for his fever. Subjective:  Admits to continued watery stools.  He has had 2 episodes since being to the hospital.  He has no nausea or vomiting and was able to eat a little bit of breakfast today.  He has no abdominal pain.  Assessment and Plan: Principal Problem:   Gastroenteritis-sepsis present on admission -Felt to be the cause of his fever and subsequent acute encephalopathy - The patient admits to taking doxycycline for an admission about a month ago-C. difficile antigen is negative -GI pathogen panel is pending - Currently receiving vancomycin cefepime and Flagyl-will DC vancomycin - Follow-up on blood cultures and stool studies prior to changing other antibiotics -He remains hypotensive with systolic blood pressures less than 120 - Continue to hold metoprolol and Maxide - Continue normal saline with  potassium at 75 cc an hour  Active Problems:  AKI, hyponatremia-due to dehydration - Creatinine 1.34 on admission-has improved to 0.99 - BUN/creatinine ratio is still elevated and greater than 20- he has continued watery stools and therefore we will continue IV fluids    Type 2 diabetes mellitus (HCC) -CBGs less than 50 - Xigduo on hold    QT prolongation   Hypokalemia -Potassium was 2.6- replaced and now normal  Platelets 130 -May be due to acute viral infection  Elevated INR - The patient's INR has been checked twice and has been 1.4 both times - Will give a dose of vitamin K and follow  Morbid obesity Body mass index is 48.12 kg/m.  Chronic pain Continue home medications     Code Status: Full Code Consultants: None Level of Care: Level of care: Stepdown Total time on patient care: 40 minutes DVT prophylaxis:  SCDs Start: 06/13/22 2348     Objective:   Vitals:   06/14/22 0706 06/14/22 0800 06/14/22 0809 06/14/22 0903  BP:    126/73  Pulse: 86  89 81  Resp: 17  15 14   Temp:  98.8 F (37.1 C)    TempSrc:  Oral    SpO2: 100%  98% 99%  Weight:      Height:       Filed Weights   06/13/22 1220  Weight: (!) 156.5 kg   Exam: General exam: Appears comfortable  HEENT: oral mucosa moist Respiratory system: Clear to auscultation.  Cardiovascular system: S1 & S2 heard  Gastrointestinal system: Abdomen soft, non-tender, nondistended. Normal bowel sounds   Extremities: No cyanosis, clubbing or edema Psychiatry:  Mood & affect appropriate.    CBC: Recent Labs  Lab 06/13/22 1306 06/14/22 0323  WBC 9.9 6.5  NEUTROABS 8.6*  --   HGB 9.5* 8.4*  HCT 30.9* 27.7*  MCV 82.2 83.4  PLT 164 130*   Basic Metabolic Panel: Recent Labs  Lab 06/13/22 1306 06/14/22 0323  NA 133* 134*  K 2.6* 3.5  CL 101 105  CO2 20* 21*  GLUCOSE 173* 110*  BUN 27* 26*  CREATININE 1.34* 0.99  CALCIUM 7.9* 7.9*  MG 2.1  --    GFR: Estimated Creatinine Clearance: 111.9  mL/min (by C-G formula based on SCr of 0.99 mg/dL).  Scheduled Meds:  aspirin EC  81 mg Oral Daily   atorvastatin  40 mg Oral QHS   buprenorphine  1 patch Transdermal Q Mon   Chlorhexidine Gluconate Cloth  6 each Topical Q0600   enoxaparin (LOVENOX) injection  75 mg Subcutaneous Q24H   feeding supplement  237 mL Oral BID BM   icosapent Ethyl  2 g Oral BID   insulin aspart  0-15 Units Subcutaneous TID WC   insulin aspart  0-5 Units Subcutaneous QHS   oxybutynin  10 mg Oral Daily   pantoprazole  40 mg Oral Daily   pregabalin  75 mg Oral BID   sodium chloride flush  3 mL Intravenous Q12H   Continuous Infusions:  0.9 % NaCl with KCl 20 mEq / L 100 mL/hr at 06/14/22 1119   ceFEPime (MAXIPIME) IV Stopped (06/14/22 0710)   lactated ringers Stopped (06/13/22 2119)   vancomycin     Imaging and lab data was personally reviewed DG Chest Port 1 View  Result Date: 06/13/2022 CLINICAL DATA:  Questionable sepsis - evaluate for abnormality EXAM: PORTABLE CHEST 1 VIEW COMPARISON:  April 19, 24. FINDINGS: Limited low lung volume study with streaky left basilar opacities. Right lung is clear. No visible effusions or pneumothorax. Cardiomediastinal silhouette is accentuated by technique. 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 IMPRESSION: Limited study with mild streaky left basilar opacities, favor atelectasis. Dedicated PA and lateral chest comparison is warranted. Electronically Signed   By: Feliberto Harts M.D.   On: 06/13/2022 13:36    LOS: 1 day   Author: Calvert Cantor  06/14/2022 12:00 PM  To contact [facility]>   Check the care team in [facility] and look for the attending/consulting [facility] provider listed  Log into [email or web address] and use [place]'s universal password   Go to> "[facility]"  and find provider  If you still have difficulty reaching the provider, please page the [facility] (Director on Call) for the Hospitalists listed on amion

## 2022-06-14 NOTE — Progress Notes (Signed)
PHARMACY NOTE:  ANTIMICROBIAL RENAL DOSAGE ADJUSTMENT  Current antimicrobial regimen includes a mismatch between antimicrobial dosage and estimated renal function.  As per policy approved by the Pharmacy & Therapeutics and Medical Executive Committees, the antimicrobial dosage will be adjusted accordingly.  Current antimicrobial dosage:  vancomycin 2 g IV q24h  Indication: sepsis  Renal Function:  Estimated Creatinine Clearance: 111.9 mL/min (by C-G formula based on SCr of 0.99 mg/dL).     Antimicrobial dosage has been changed to:  1250 mg IV q12h   Thank you for allowing pharmacy to be a part of this patient's care.   Pricilla Riffle, PharmD, BCPS Clinical Pharmacist 06/14/2022 11:06 AM

## 2022-06-15 DIAGNOSIS — K529 Noninfective gastroenteritis and colitis, unspecified: Secondary | ICD-10-CM | POA: Diagnosis not present

## 2022-06-15 LAB — BASIC METABOLIC PANEL
Anion gap: 9 (ref 5–15)
BUN: 18 mg/dL (ref 8–23)
CO2: 20 mmol/L — ABNORMAL LOW (ref 22–32)
Calcium: 8.2 mg/dL — ABNORMAL LOW (ref 8.9–10.3)
Chloride: 108 mmol/L (ref 98–111)
Creatinine, Ser: 0.81 mg/dL (ref 0.61–1.24)
GFR, Estimated: >60 mL/min (ref >60)
Glucose, Bld: 102 mg/dL — ABNORMAL HIGH (ref 70–99)
Potassium: 3.6 mmol/L (ref 3.5–5.1)
Sodium: 137 mmol/L (ref 135–145)

## 2022-06-15 LAB — CBC
HCT: 27.2 % — ABNORMAL LOW (ref 39.0–52.0)
Hemoglobin: 8 g/dL — ABNORMAL LOW (ref 13.0–17.0)
MCH: 24.8 pg — ABNORMAL LOW (ref 26.0–34.0)
MCHC: 29.4 g/dL — ABNORMAL LOW (ref 30.0–36.0)
MCV: 84.5 fL (ref 80.0–100.0)
Platelets: 101 10*3/uL — ABNORMAL LOW (ref 150–400)
RBC: 3.22 MIL/uL — ABNORMAL LOW (ref 4.22–5.81)
RDW: 15.3 % (ref 11.5–15.5)
WBC: 2.6 10*3/uL — ABNORMAL LOW (ref 4.0–10.5)
nRBC: 0 % (ref 0.0–0.2)

## 2022-06-15 LAB — CULTURE, BLOOD (ROUTINE X 2)

## 2022-06-15 LAB — GLUCOSE, CAPILLARY
Glucose-Capillary: 102 mg/dL — ABNORMAL HIGH (ref 70–99)
Glucose-Capillary: 115 mg/dL — ABNORMAL HIGH (ref 70–99)
Glucose-Capillary: 126 mg/dL — ABNORMAL HIGH (ref 70–99)
Glucose-Capillary: 114 mg/dL — ABNORMAL HIGH (ref 70–99)

## 2022-06-15 MED ORDER — PHYTONADIONE 5 MG PO TABS
5.0000 mg | ORAL_TABLET | Freq: Once | ORAL | Status: AC
  Administered 2022-06-15: 5 mg via ORAL
  Filled 2022-06-15: qty 1

## 2022-06-15 NOTE — Progress Notes (Signed)
[facility] Progress Note  Patient: [name]     ZOX:[id number]  DOA: 06/13/2022   PCP: Eunice Blase, PA-C       Brief hospital course: This is a 67 year old male with history of chronic pain, diabetes mellitus and obesity who presents to the hospital with a fever and diarrhea. The patient states that he could not remember the events from Thursday night until Friday afternoon.  His wife has told him that he had a temperature of 104 degrees.  He admits to 2 days of diarrhea with a runny stool and about 5 episodes a day.  He states that his grandkids have had the same and feels that he has caught it from them. He presented to the hospital with confusion and was noted to have a potassium level of 2.6, sodium 133 and creatinine 1.34. Temperature was noted to be 103.2, respiratory rate in the 20s.  WBC count was normal at 9.9. Lactic acid was initially elevated at 2.1, then 7.9 and slowly improved down to 1.1.  He was admitted in April for fever and resultant acute encephalopathy.  He was treated with IV antibiotics and subsequently placed on doxycycline.  No cause was found for his fever. Subjective:  He had 2 episodes of watery stools yesterday.  Oral fluid intake is poor.  Assessment and Plan: Principal Problem:   Gastroenteritis-sepsis present on admission -Felt to be the cause of his fever and subsequent acute encephalopathy - The patient admits to taking doxycycline for an admission about a month ago-C. difficile antigen is negative -GI pathogen panel reveals rotavirus - Currently receiving cefepime and Flagyl-will discontinue both -Blood cultures are negative -He remains hypotensive related to sepsis with systolic blood pressures less than 120- AKI, hyponatremia-due to dehydration  Active problems:  AKI - Creatinine 1.34 on admission-has improved to 0.8 - BUN/creatinine ratio is still elevated and greater than 20- he has continued watery stools and therefore we will  continue IV fluids continues to have poor oral intake of fluids  Acidosis cidosis - Continues to have mild metabolic acidosis which is likely secondary to diarrhea - Lactic acid was as high as 7.9 and improved to 1.1    Type 2 diabetes mellitus (HCC) -CBGs less than 150 - Xigduo on hold    QT prolongation   Hypokalemia -Potassium was 2.6- replaced and now normal  Leukopenia and thrombocytopenia -WBC count 2.6 and platelet 101 -May be due to acute viral infection  Elevated INR - The patient's INR has been checked twice and has been 1.4 both times - Will give a dose of vitamin K and follow  Morbid obesity Body mass index is 48.12 kg/m.  Chronic pain Continue home medications     Code Status: Full Code Consultants: None Level of Care: Level of care: Med-Surg Total time on patient care: 40 minutes DVT prophylaxis:  SCDs Start: 06/13/22 2348     Objective:   Vitals:   06/14/22 1727 06/14/22 2114 06/15/22 0131 06/15/22 0534  BP: (!) 115/56 (!) 116/57 120/66 (!) 116/50  Pulse: 81 74 63 62  Resp: 16 18 18 20   Temp: 99.1 F (37.3 C) 98.3 F (36.8 C) (!) 97.4 F (36.3 C) 98.3 F (36.8 C)  TempSrc: Oral Oral Oral Oral  SpO2: 100% 99% 100% 99%  Weight:      Height:       Filed Weights   06/13/22 1220  Weight: (!) 156.5 kg   Exam: General exam: Appears comfortable  HEENT: oral mucosa  moist Respiratory system: Clear to auscultation.  Cardiovascular system: S1 & S2 heard  Gastrointestinal system: Abdomen soft, non-tender, nondistended. Normal bowel sounds   Extremities: No cyanosis, clubbing or edema Psychiatry:  Mood & affect appropriate.     CBC: Recent Labs  Lab 06/13/22 1306 06/14/22 0323 06/15/22 0757  WBC 9.9 6.5 2.6*  NEUTROABS 8.6*  --   --   HGB 9.5* 8.4* 8.0*  HCT 30.9* 27.7* 27.2*  MCV 82.2 83.4 84.5  PLT 164 130* 101*    Basic Metabolic Panel: Recent Labs  Lab 06/13/22 1306 06/14/22 0323 06/15/22 0757  NA 133* 134* 137  K 2.6* 3.5  3.6  CL 101 105 108  CO2 20* 21* 20*  GLUCOSE 173* 110* 102*  BUN 27* 26* 18  CREATININE 1.34* 0.99 0.81  CALCIUM 7.9* 7.9* 8.2*  MG 2.1  --   --     GFR: Estimated Creatinine Clearance: 136.8 mL/min (by C-G formula based on SCr of 0.81 mg/dL).  Scheduled Meds:  aspirin EC  81 mg Oral Daily   atorvastatin  40 mg Oral QHS   buprenorphine  1 patch Transdermal Weekly   Chlorhexidine Gluconate Cloth  6 each Topical Q0600   enoxaparin (LOVENOX) injection  75 mg Subcutaneous Q24H   feeding supplement  237 mL Oral BID BM   icosapent Ethyl  2 g Oral BID   insulin aspart  0-15 Units Subcutaneous TID WC   insulin aspart  0-5 Units Subcutaneous QHS   oxybutynin  10 mg Oral Daily   pantoprazole  40 mg Oral Daily   pregabalin  75 mg Oral BID   sodium chloride flush  3 mL Intravenous Q12H   Continuous Infusions:  0.9 % NaCl with KCl 20 mEq / L 75 mL/hr at 06/15/22 0159   lactated ringers Stopped (06/13/22 2119)   Imaging and lab data was personally reviewed DG Chest Port 1 View  Result Date: 06/13/2022 CLINICAL DATA:  Questionable sepsis - evaluate for abnormality EXAM: PORTABLE CHEST 1 VIEW COMPARISON:  April 19, 24. FINDINGS: Limited low lung volume study with streaky left basilar opacities. Right lung is clear. No visible effusions or pneumothorax. Cardiomediastinal silhouette is accentuated by technique. 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 IMPRESSION: Limited study with mild streaky left basilar opacities, favor atelectasis. Dedicated PA and lateral chest comparison is warranted. Electronically Signed   By: Feliberto Harts M.D.   On: 06/13/2022 13:36    LOS: 2 days   Author: Ladell Heads Kandi Brusseau  06/15/2022 12:54 PM  To contact [facility]>   Check the care team in [facility] and look for the attending/consulting [facility] provider listed  Log into [email or web address] and use [place]'s universal password   Go to> "[facility]"  and find provider  If you still  have difficulty reaching the provider, please page the [facility] (Director on Call) for the Hospitalists listed on amion

## 2022-06-16 DIAGNOSIS — K529 Noninfective gastroenteritis and colitis, unspecified: Secondary | ICD-10-CM | POA: Diagnosis not present

## 2022-06-16 LAB — BASIC METABOLIC PANEL
Anion gap: 7 (ref 5–15)
BUN: 16 mg/dL (ref 8–23)
CO2: 21 mmol/L — ABNORMAL LOW (ref 22–32)
Calcium: 8.2 mg/dL — ABNORMAL LOW (ref 8.9–10.3)
Chloride: 111 mmol/L (ref 98–111)
Creatinine, Ser: 0.67 mg/dL (ref 0.61–1.24)
GFR, Estimated: >60 mL/min (ref >60)
Glucose, Bld: 91 mg/dL (ref 70–99)
Potassium: 4 mmol/L (ref 3.5–5.1)
Sodium: 139 mmol/L (ref 135–145)

## 2022-06-16 LAB — CBC
HCT: 25.2 % — ABNORMAL LOW (ref 39.0–52.0)
Hemoglobin: 7.4 g/dL — ABNORMAL LOW (ref 13.0–17.0)
MCH: 24.7 pg — ABNORMAL LOW (ref 26.0–34.0)
MCHC: 29.4 g/dL — ABNORMAL LOW (ref 30.0–36.0)
MCV: 84.3 fL (ref 80.0–100.0)
Platelets: 103 10*3/uL — ABNORMAL LOW (ref 150–400)
RBC: 2.99 MIL/uL — ABNORMAL LOW (ref 4.22–5.81)
RDW: 15 % (ref 11.5–15.5)
WBC: 2 10*3/uL — ABNORMAL LOW (ref 4.0–10.5)
nRBC: 0 % (ref 0.0–0.2)

## 2022-06-16 LAB — GLUCOSE, CAPILLARY
Glucose-Capillary: 101 mg/dL — ABNORMAL HIGH (ref 70–99)
Glucose-Capillary: 80 mg/dL (ref 70–99)
Glucose-Capillary: 96 mg/dL (ref 70–99)
Glucose-Capillary: 107 mg/dL — ABNORMAL HIGH (ref 70–99)

## 2022-06-16 LAB — PROTIME-INR
INR: 1.2 (ref 0.8–1.2)
Prothrombin Time: 15.2 seconds (ref 11.4–15.2)

## 2022-06-16 LAB — CULTURE, BLOOD (ROUTINE X 2): Culture: NO GROWTH

## 2022-06-16 NOTE — Progress Notes (Signed)
[facility] Progress Note  Patient: [name]     ZOX:[id number]  DOA: 06/13/2022   PCP: Eunice Blase, PA-C       Brief hospital course: This is a 67 year old male with history of chronic pain, diabetes mellitus and obesity who presents to the hospital with a fever and diarrhea. The patient states that he could not remember the events from Thursday night until Friday afternoon.  His wife has told him that he had a temperature of 104 degrees.  He admits to 2 days of diarrhea with a runny stool and about 5 episodes a day.  He states that his grandkids have had the same and feels that he has caught it from them. He presented to the hospital with confusion and was noted to have a potassium level of 2.6, sodium 133 and creatinine 1.34. Temperature was noted to be 103.2, respiratory rate in the 20s.  WBC count was normal at 9.9. Lactic acid was initially elevated at 2.1, then 7.9 and slowly improved down to 1.1.  He was admitted in April for fever and resultant acute encephalopathy.  He was treated with IV antibiotics and subsequently placed on doxycycline.  No cause was found for his fever. Subjective:  2 stools yesterday. No complaints.   Assessment and Plan: Principal Problem:   Gastroenteritis-sepsis present on admission -Felt to be the cause of his fever and subsequent acute encephalopathy - The patient admits to taking doxycycline for an admission about a month ago-C. difficile antigen is negative -GI pathogen panel reveals rotavirus - Currently receiving cefepime and Flagyl-will discontinue both -Blood cultures are negative -He remains hypotensive (118/51)   Active problems:  AKI - Creatinine 1.34 on admission-has improved to 0.67- stop IVF  Acidosis   - Continues to have mild metabolic acidosis with a bicarb of 21 which is likely secondary to diarrhea - Lactic acid was as high as 7.9 and improved to 1.1    Type 2 diabetes mellitus (HCC) -CBGs less than 150 -  Xigduo on hold    QT prolongation   Hypokalemia -Potassium was 2.6- replaced and now normal  Pancytopenia -WBC count 9.9> 2.6> 2.0 Hgb 8.5 > 7.4 - Plt 164 >  103 -May be due to acute viral infection- as counts are still decreasing today, I am not going to dc him  Elevated INR - The patient's INR has been checked twice and has been 1.4 both times - normalized after Vit K  Morbid obesity Body mass index is 48.12 kg/m.  Chronic pain Continue home medications     Code Status: Full Code Consultants: None Level of Care: Level of care: Med-Surg Total time on patient care: 30 minutes DVT prophylaxis:  SCDs Start: 06/13/22 2348     Objective:   Vitals:   06/15/22 1340 06/15/22 2122 06/16/22 0548 06/16/22 1146  BP: (!) 111/52 (!) 105/57 119/65 (!) 118/51  Pulse: 69 70 64 66  Resp: 18 18 18 18   Temp: 98.6 F (37 C) 98.9 F (37.2 C) 99 F (37.2 C) 97.8 F (36.6 C)  TempSrc: Oral Oral Oral Oral  SpO2: 99% 100% 100% 100%  Weight:      Height:       Filed Weights   06/13/22 1220  Weight: (!) 156.5 kg   Exam: General exam: Appears comfortable  HEENT: oral mucosa moist Respiratory system: Clear to auscultation.  Cardiovascular system: S1 & S2 heard  Gastrointestinal system: Abdomen soft, non-tender, nondistended. Normal bowel sounds   Extremities: No cyanosis,  clubbing or edema Psychiatry:  Mood & affect appropriate.      CBC: Recent Labs  Lab 06/13/22 1306 06/14/22 0323 06/15/22 0757 06/16/22 0506  WBC 9.9 6.5 2.6* 2.0*  NEUTROABS 8.6*  --   --   --   HGB 9.5* 8.4* 8.0* 7.4*  HCT 30.9* 27.7* 27.2* 25.2*  MCV 82.2 83.4 84.5 84.3  PLT 164 130* 101* 103*    Basic Metabolic Panel: Recent Labs  Lab 06/13/22 1306 06/14/22 0323 06/15/22 0757 06/16/22 0506  NA 133* 134* 137 139  K 2.6* 3.5 3.6 4.0  CL 101 105 108 111  CO2 20* 21* 20* 21*  GLUCOSE 173* 110* 102* 91  BUN 27* 26* 18 16  CREATININE 1.34* 0.99 0.81 0.67  CALCIUM 7.9* 7.9* 8.2* 8.2*  MG  2.1  --   --   --     GFR: Estimated Creatinine Clearance: 138.5 mL/min (by C-G formula based on SCr of 0.67 mg/dL).  Scheduled Meds:  aspirin EC  81 mg Oral Daily   atorvastatin  40 mg Oral QHS   buprenorphine  1 patch Transdermal Weekly   Chlorhexidine Gluconate Cloth  6 each Topical Q0600   enoxaparin (LOVENOX) injection  75 mg Subcutaneous Q24H   feeding supplement  237 mL Oral BID BM   icosapent Ethyl  2 g Oral BID   insulin aspart  0-15 Units Subcutaneous TID WC   insulin aspart  0-5 Units Subcutaneous QHS   oxybutynin  10 mg Oral Daily   pantoprazole  40 mg Oral Daily   pregabalin  75 mg Oral BID   sodium chloride flush  3 mL Intravenous Q12H   Continuous Infusions:  lactated ringers Stopped (06/13/22 2119)   Imaging and lab data was personally reviewed No results found.  LOS: 3 days   Author: Calvert Cantor  06/16/2022 1:23 PM  To contact [facility]>   Check the care team in [facility] and look for the attending/consulting [facility] provider listed  Log into [email or web address] and use [place]'s universal password   Go to> "[facility]"  and find provider  If you still have difficulty reaching the provider, please page the [facility] (Director on Call) for the Hospitalists listed on amion

## 2022-06-16 NOTE — Progress Notes (Signed)
Mobility Specialist - Progress Note   06/16/22 0920  Mobility  Activity Ambulated with assistance in hallway  Level of Assistance Independent after set-up  Assistive Device Cane  Distance Ambulated (ft) 460 ft  Activity Response Tolerated well  Mobility Referral Yes  $Mobility charge 1 Mobility  Mobility Specialist Start Time (ACUTE ONLY) 0859  Mobility Specialist Stop Time (ACUTE ONLY) 0919  Mobility Specialist Time Calculation (min) (ACUTE ONLY) 20 min   Pt received in recliner and agreeable to mobility. Pt took 1x seated rest break (~35min) due to back pain. No complaints during session. Pt to recliner after session with all needs met.    [facility]

## 2022-06-16 NOTE — TOC CM/SW Note (Signed)
Transition of Care [facility]) - Inpatient Brief Assessment  Patient Details  Name: [name] MRN: [id number] Date of Birth: [birth year]  Transition of Care [facility]) CM/SW Contact:    Ewing Schlein, LCSW Phone Number: 06/16/2022, 11:21 AM  Clinical Narrative: Screening completed. No TOC needs identified at this time.  Transition of Care Asessment: Insurance and Status: Insurance coverage has been reviewed Patient has primary care physician: Yes Home environment has been reviewed: Yes, resides at home with spouse Prior level of function:: Independent with ADLs at baseline Prior/Current Home Services: No current home services Social Determinants of Health Reivew: SDOH reviewed no interventions necessary Readmission risk has been reviewed: Yes Transition of care needs: no transition of care needs at this time

## 2022-06-16 NOTE — Progress Notes (Addendum)
Mobility Specialist - Progress Note   06/16/22 1303  Mobility  Activity Ambulated with assistance in hallway  Level of Assistance Modified independent, requires aide device or extra time  Assistive Device Cane  Distance Ambulated (ft) 460 ft  Activity Response Tolerated well  Mobility Referral Yes  $Mobility charge 1 Mobility  Mobility Specialist Start Time (ACUTE ONLY) 1349  Mobility Specialist Stop Time (ACUTE ONLY) 1402  Mobility Specialist Time Calculation (min) (ACUTE ONLY) 13 min   Pt received in recliner and agreeable to mobility. Pt took 1x seated rest break during session due to back pain (~20min)/ No complaints during session. Pt to recliner after session with all needs met.    [facility]

## 2022-06-17 DIAGNOSIS — K529 Noninfective gastroenteritis and colitis, unspecified: Secondary | ICD-10-CM | POA: Diagnosis not present

## 2022-06-17 LAB — TYPE AND SCREEN: ABO/RH(D): O POS

## 2022-06-17 LAB — CBC
HCT: 24.6 % — ABNORMAL LOW (ref 39.0–52.0)
Hemoglobin: 7.4 g/dL — ABNORMAL LOW (ref 13.0–17.0)
MCH: 25.2 pg — ABNORMAL LOW (ref 26.0–34.0)
MCHC: 30.1 g/dL (ref 30.0–36.0)
MCV: 83.7 fL (ref 80.0–100.0)
Platelets: 94 10*3/uL — ABNORMAL LOW (ref 150–400)
RBC: 2.94 MIL/uL — ABNORMAL LOW (ref 4.22–5.81)
RDW: 15.1 % (ref 11.5–15.5)
WBC: 2.5 10*3/uL — ABNORMAL LOW (ref 4.0–10.5)
nRBC: 0 % (ref 0.0–0.2)

## 2022-06-17 LAB — IRON AND TIBC
Iron: 29 ug/dL — ABNORMAL LOW (ref 45–182)
Saturation Ratios: 7 % — ABNORMAL LOW (ref 17.9–39.5)
TIBC: 430 ug/dL (ref 250–450)
UIBC: 401 ug/dL

## 2022-06-17 LAB — GLUCOSE, CAPILLARY
Glucose-Capillary: 92 mg/dL (ref 70–99)
Glucose-Capillary: 74 mg/dL (ref 70–99)
Glucose-Capillary: 64 mg/dL — ABNORMAL LOW (ref 70–99)
Glucose-Capillary: 75 mg/dL (ref 70–99)
Glucose-Capillary: 128 mg/dL — ABNORMAL HIGH (ref 70–99)

## 2022-06-17 LAB — CULTURE, BLOOD (ROUTINE X 2): Culture: NO GROWTH

## 2022-06-17 LAB — HEMOGLOBIN AND HEMATOCRIT, BLOOD
HCT: 31.5 % — ABNORMAL LOW (ref 39.0–52.0)
Hemoglobin: 9.3 g/dL — ABNORMAL LOW (ref 13.0–17.0)

## 2022-06-17 LAB — PREPARE RBC (CROSSMATCH)

## 2022-06-17 LAB — RETICULOCYTES
Immature Retic Fract: 14.5 % (ref 2.3–15.9)
RBC.: 3.04 MIL/uL — ABNORMAL LOW (ref 4.22–5.81)
Retic Count, Absolute: 30.1 10*3/uL (ref 19.0–186.0)
Retic Ct Pct: 1 % (ref 0.4–3.1)

## 2022-06-17 LAB — FOLATE: Folate: 11.2 ng/mL (ref >5.9)

## 2022-06-17 LAB — VITAMIN B12: Vitamin B-12: 314 pg/mL (ref 180–914)

## 2022-06-17 LAB — FERRITIN: Ferritin: 23 ng/mL — ABNORMAL LOW (ref 24–336)

## 2022-06-17 LAB — BPAM RBC: Unit Type and Rh: 5100

## 2022-06-17 MED ORDER — SODIUM CHLORIDE 0.9 % IV SOLN
250.0000 mg | Freq: Every day | INTRAVENOUS | Status: AC
  Administered 2022-06-17 – 2022-06-18 (×2): 250 mg via INTRAVENOUS
  Filled 2022-06-17 (×3): qty 20

## 2022-06-17 MED ORDER — SODIUM CHLORIDE 0.9% IV SOLUTION: Freq: Once | INTRAVENOUS | Status: AC

## 2022-06-17 NOTE — Progress Notes (Signed)
1 unit of blood was given to pt, no reaction was noted on the transfusion. H&H was ordered. Pt's CBG was low at 64, pt remains alert and oriented, no symptom. Encouraged pt to get drinks and eat something, rechecked CBG after at 75.

## 2022-06-17 NOTE — Progress Notes (Signed)
Mobility Specialist - Progress Note   06/17/22 1048  Mobility  Activity Ambulated with assistance in hallway  Level of Assistance Modified independent, requires aide device or extra time  Assistive Device Cane  Distance Ambulated (ft) 250 ft  Activity Response Tolerated well  Mobility Referral Yes  $Mobility charge 1 Mobility  Mobility Specialist Start Time (ACUTE ONLY) 1031  Mobility Specialist Stop Time (ACUTE ONLY) 1047  Mobility Specialist Time Calculation (min) (ACUTE ONLY) 16 min   Pt received in recliner and agreeable to mobility. Pt took 1x standing rest break. Pt to recliner after session with all needs met.    [facility]

## 2022-06-17 NOTE — Progress Notes (Addendum)
[facility] Progress Note  Patient: [name]     WJX:[id number]  DOA: 06/13/2022   PCP: Eunice Blase, PA-C       Brief hospital course: This is a 67 year old male with history of chronic pain, diabetes mellitus and obesity who presents to the hospital with a fever and diarrhea. The patient states that he could not remember the events from Thursday night until Friday afternoon.  His wife has told him that he had a temperature of 104 degrees.  He admits to 2 days of diarrhea with a runny stool and about 5 episodes a day.  He states that his grandkids have had the same and feels that he has caught it from them. He presented to the hospital with confusion and was noted to have a potassium level of 2.6, sodium 133 and creatinine 1.34. Temperature was noted to be 103.2, respiratory rate in the 20s.  WBC count was normal at 9.9. Lactic acid was initially elevated at 2.1, then 7.9 and slowly improved down to 1.1.  He was admitted in April for fever and resultant acute encephalopathy.  He was treated with IV antibiotics and subsequently placed on doxycycline.  No cause was found for his fever. Subjective:  Diarrhea resolved.   Assessment and Plan: Principal Problem:   Gastroenteritis-sepsis present on admission -Felt to be the cause of his fever and subsequent acute encephalopathy - The patient admits to taking doxycycline for an admission about a month ago-C. difficile antigen is negative -GI pathogen panel reveals rotavirus -Blood cultures are negative - diarrhea resolved- tolerating regular diet  Active problems:  AKI - Creatinine 1.34 on admission-has improved to 0.67- stopped his IVF  Acidosis   - Continues to have mild metabolic acidosis with a bicarb of 21 which is likely secondary to diarrhea - Lactic acid was as high as 7.9 and improved to 1.1    Type 2 diabetes mellitus (HCC) -CBGs less than 150 - Xigduo on hold    QT prolongation   Hypokalemia -Potassium was  2.6- replaced and now normal  Pancytopenia -WBC count 9.9> 2.6> 2.0> 2.5 - Hgb 8.5 > 7.4 - Plt 164 >  103 - anemia panel checked today - Ferritin is 23-  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  Chronic pain Continue home medications     Code Status: Full Code Consultants: None Level of Care: Level of care: Med-Surg Total time on patient care: 30 minutes DVT prophylaxis:  SCDs Start: 06/13/22 2348     Objective:   Vitals:   06/17/22 0629 06/17/22 1130 06/17/22 1310 06/17/22 1340  BP: (!) 101/53 119/63 113/66 118/69  Pulse: 70 64 62 61  Resp: 18 18 17 17   Temp: 98.6 F (37 C) 98.2 F (36.8 C) 98.2 F (36.8 C) 98.5 F (36.9 C)  TempSrc: Oral Oral Oral Oral  SpO2: 99% 100% 99% 100%  Weight:      Height:       Filed  Weights   06/13/22 1220  Weight: (!) 156.5 kg   Exam: General exam: Appears comfortable  HEENT: oral mucosa moist Respiratory system: Clear to auscultation.  Cardiovascular system: S1 & S2 heard  Gastrointestinal system: Abdomen soft, non-tender, nondistended. Normal bowel sounds   Extremities: No cyanosis, clubbing or edema Psychiatry:  Mood & affect appropriate.    CBC: Recent Labs  Lab 06/13/22 1306 06/14/22 0323 06/15/22 0757 06/16/22 0506 06/17/22 0451  WBC 9.9 6.5 2.6* 2.0* 2.5*  NEUTROABS 8.6*  --   --   --   --   HGB 9.5* 8.4* 8.0* 7.4* 7.4*  HCT 30.9* 27.7* 27.2* 25.2* 24.6*  MCV 82.2 83.4 84.5 84.3 83.7  PLT 164 130* 101* 103* 94*    Basic  Metabolic Panel: Recent Labs  Lab 06/13/22 1306 06/14/22 0323 06/15/22 0757 06/16/22 0506  NA 133* 134* 137 139  K 2.6* 3.5 3.6 4.0  CL 101 105 108 111  CO2 20* 21* 20* 21*  GLUCOSE 173* 110* 102* 91  BUN 27* 26* 18 16  CREATININE 1.34* 0.99 0.81 0.67  CALCIUM 7.9* 7.9* 8.2* 8.2*  MG 2.1  --   --   --     GFR: Estimated Creatinine Clearance: 138.5 mL/min (by C-G formula based on SCr of 0.67 mg/dL).  Scheduled Meds:  aspirin EC  81 mg Oral Daily   atorvastatin  40 mg Oral QHS   buprenorphine  1 patch Transdermal Weekly   Chlorhexidine Gluconate Cloth  6 each Topical Q0600   feeding supplement  237 mL Oral BID BM   icosapent Ethyl  2 g Oral BID   insulin aspart  0-15 Units Subcutaneous TID WC   insulin aspart  0-5 Units Subcutaneous QHS   oxybutynin  10 mg Oral Daily   pregabalin  75 mg Oral BID   sodium chloride flush  3 mL Intravenous Q12H   Continuous Infusions:  lactated ringers Stopped (06/13/22 2119)   Imaging and lab data was personally reviewed No results found.  LOS: 4 days   Author: Calvert Cantor  06/17/2022 1:56 PM  To contact [facility]>   Check the care team in [facility] and look for the attending/consulting [facility] provider listed  Log into [email or web address] and use [place]'s universal password   Go to> "[facility]"  and find provider  If you still have difficulty reaching the provider, please page the [facility] (Director on Call) for the Hospitalists listed on amion

## 2022-06-18 DIAGNOSIS — K529 Noninfective gastroenteritis and colitis, unspecified: Secondary | ICD-10-CM | POA: Diagnosis not present

## 2022-06-18 LAB — COMPREHENSIVE METABOLIC PANEL
ALT: 31 U/L (ref 0–44)
AST: 39 U/L (ref 15–41)
Albumin: 2.8 g/dL — ABNORMAL LOW (ref 3.5–5.0)
Alkaline Phosphatase: 60 U/L (ref 38–126)
Anion gap: 7 (ref 5–15)
BUN: 12 mg/dL (ref 8–23)
CO2: 23 mmol/L (ref 22–32)
Calcium: 8.4 mg/dL — ABNORMAL LOW (ref 8.9–10.3)
Chloride: 107 mmol/L (ref 98–111)
Creatinine, Ser: 0.74 mg/dL (ref 0.61–1.24)
GFR, Estimated: >60 mL/min (ref >60)
Glucose, Bld: 138 mg/dL — ABNORMAL HIGH (ref 70–99)
Potassium: 3.9 mmol/L (ref 3.5–5.1)
Sodium: 137 mmol/L (ref 135–145)
Total Bilirubin: 0.6 mg/dL (ref 0.3–1.2)
Total Protein: 5.9 g/dL — ABNORMAL LOW (ref 6.5–8.1)

## 2022-06-18 LAB — DIFFERENTIAL
Abs Immature Granulocytes: 0.11 10*3/uL — ABNORMAL HIGH (ref 0.00–0.07)
Basophils Absolute: 0 10*3/uL (ref 0.0–0.1)
Basophils Relative: 1 %
Eosinophils Absolute: 0.1 10*3/uL (ref 0.0–0.5)
Eosinophils Relative: 2 %
Immature Granulocytes: 3 %
Lymphocytes Relative: 23 %
Lymphs Abs: 0.8 10*3/uL (ref 0.7–4.0)
Monocytes Absolute: 0.5 10*3/uL (ref 0.1–1.0)
Monocytes Relative: 13 %
Neutro Abs: 2.1 10*3/uL (ref 1.7–7.7)
Neutrophils Relative %: 58 %

## 2022-06-18 LAB — RETICULOCYTES
Immature Retic Fract: 39.4 % — ABNORMAL HIGH (ref 2.3–15.9)
RBC.: 3.28 MIL/uL — ABNORMAL LOW (ref 4.22–5.81)
Retic Count, Absolute: 49.5 10*3/uL (ref 19.0–186.0)
Retic Ct Pct: 1.5 % (ref 0.4–3.1)

## 2022-06-18 LAB — CBC
HCT: 27.8 % — ABNORMAL LOW (ref 39.0–52.0)
Hemoglobin: 8.3 g/dL — ABNORMAL LOW (ref 13.0–17.0)
MCH: 25.9 pg — ABNORMAL LOW (ref 26.0–34.0)
MCHC: 29.9 g/dL — ABNORMAL LOW (ref 30.0–36.0)
MCV: 86.9 fL (ref 80.0–100.0)
Platelets: 54 10*3/uL — ABNORMAL LOW (ref 150–400)
RBC: 3.2 MIL/uL — ABNORMAL LOW (ref 4.22–5.81)
RDW: 15.2 % (ref 11.5–15.5)
WBC: 3.5 10*3/uL — ABNORMAL LOW (ref 4.0–10.5)
nRBC: 0 % (ref 0.0–0.2)

## 2022-06-18 LAB — URIC ACID: Uric Acid, Serum: 3.8 mg/dL (ref 3.7–8.6)

## 2022-06-18 LAB — GLUCOSE, CAPILLARY
Glucose-Capillary: 87 mg/dL (ref 70–99)
Glucose-Capillary: 94 mg/dL (ref 70–99)
Glucose-Capillary: 119 mg/dL — ABNORMAL HIGH (ref 70–99)
Glucose-Capillary: 143 mg/dL — ABNORMAL HIGH (ref 70–99)

## 2022-06-18 LAB — BPAM RBC
Blood Product Expiration Date: 202407122359
ISSUE DATE / TIME: 202406111320

## 2022-06-18 LAB — TYPE AND SCREEN
Antibody Screen: NEGATIVE
Unit division: 0

## 2022-06-18 LAB — LACTATE DEHYDROGENASE: LDH: 218 U/L — ABNORMAL HIGH (ref 98–192)

## 2022-06-18 LAB — PROTIME-INR
INR: 1.2 (ref 0.8–1.2)
Prothrombin Time: 15.6 seconds — ABNORMAL HIGH (ref 11.4–15.2)

## 2022-06-18 LAB — TECHNOLOGIST SMEAR REVIEW

## 2022-06-18 LAB — CULTURE, BLOOD (ROUTINE X 2)

## 2022-06-18 NOTE — Progress Notes (Signed)
Mobility Specialist - Progress Note   06/18/22 1329  Mobility  Activity Ambulated with assistance in hallway  Level of Assistance Modified independent, requires aide device or extra time  Assistive Device Cane  Distance Ambulated (ft) 200 ft  Activity Response Tolerated well  Mobility Referral Yes  $Mobility charge 1 Mobility  Mobility Specialist Start Time (ACUTE ONLY) 0117  Mobility Specialist Stop Time (ACUTE ONLY) 0128  Mobility Specialist Time Calculation (min) (ACUTE ONLY) 11 min   Pt received in recliner and agreeable to mobility. Pt took 1x standing rest break during session. Pt to recliner after session with all needs met.    [facility]

## 2022-06-18 NOTE — Progress Notes (Signed)
PROGRESS NOTE    [name]  AVW:[id number] DOB: [birth year] DOA: 06/13/2022 PCP: Eunice Blase, PA-C     Brief Narrative:  [name] is a  67 year old male with history of chronic pain, diabetes mellitus and obesity who presents to the hospital with a fever and diarrhea. The patient states that he could not remember the events from Thursday night until Friday afternoon.  His wife has told him that he had a temperature of 104 degrees.  He admits to 2 days of diarrhea with a runny stool and about 5 episodes a day.  He states that his grandkids have had the same and feels that he has caught it from them.  He presented to the hospital with confusion and was noted to have a potassium level of 2.6, sodium 133 and creatinine 1.34. Temperature was noted to be 103.2, respiratory rate in the 20s.  WBC count was normal at 9.9. Lactic acid was initially elevated at 2.1, then 7.9 and slowly improved down to 1.1.   He was admitted in April for fever and resultant acute encephalopathy.  He was treated with IV antibiotics and subsequently placed on doxycycline.  No cause was found for his fever at that time.   New events last 24 hours / Subjective: Patient without any acute complaints today.  He has been ambulating around the room.  Diarrhea has resolved.  Denies any bleeding.  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  His anemia is not new, ranging 9.9-12.5.  Thrombocytopenia could be in setting of severe sepsis.  Diabetes mellitus -Sliding scale insulin  Hyperlipidemia -Lipitor, Vascepa  AKI -Resolved  Lactic acidosis -Resolved  Severe obesity -BMI 48  Chronic pain -Continue home medications, buprenorphine   DVT prophylaxis:  SCDs Start: 06/13/22 2348  Code Status: Full code Family Communication: No family at bedside Disposition Plan: Home Status is: Inpatient Remains inpatient appropriate because: Continue to monitor CBC    Antimicrobials:  Anti-infectives (From admission, onward)    Start     Dose/Rate Route Frequency Ordered Stop   06/14/22 1500  vancomycin (VANCOREADY) IVPB 2000 mg/400 mL  Status:  Discontinued        2,000 mg 200 mL/hr over 120 Minutes Intravenous Every 24 hours 06/13/22 1420 06/14/22 1104   06/14/22 1200  vancomycin (VANCOREADY) IVPB 1250 mg/250 mL  Status:  Discontinued        1,250 mg 166.7 mL/hr over 90 Minutes Intravenous Every 12 hours 06/14/22 1104 06/14/22 1206   06/13/22 2200  ceFEPIme (MAXIPIME) 2 g in sodium chloride 0.9 % 100 mL IVPB  Status:  Discontinued        2 g 200 mL/hr over 30 Minutes Intravenous Every 8 hours 06/13/22 1422 06/15/22 0740   06/13/22 1430  vancomycin (VANCOCIN) IVPB 1000 mg/200 mL premix        1,000 mg 200 mL/hr  over 60 Minutes Intravenous  Once 06/13/22 1304 06/13/22 1714   06/13/22 1315  ceFEPIme (MAXIPIME) 2 g in sodium chloride 0.9 % 100 mL IVPB        2 g 200 mL/hr over 30 Minutes Intravenous  Once 06/13/22 1301 06/13/22 1402   06/13/22 1315  metroNIDAZOLE (FLAGYL) IVPB 500 mg        500 mg 100 mL/hr over 60 Minutes Intravenous  Once 06/13/22 1301 06/13/22 1510   06/13/22 1315  vancomycin (VANCOCIN) IVPB 1000 mg/200 mL premix        1,000 mg 200 mL/hr over 60 Minutes Intravenous  Once 06/13/22 1301 06/13/22 1714         Objective: Vitals:   06/17/22 1340 06/17/22 1620 06/17/22 2017 06/18/22 0617  BP: 118/69 122/82 124/66 138/73  Pulse: 61 68 63 70  Resp: 17 17 18 18   Temp: 98.5 F (36.9 C) 98.5 F (36.9 C) 98.4 F (36.9 C) 98.1 F (36.7 C)  TempSrc: Oral Oral Oral Oral  SpO2: 100% 99% 97% 100%  Weight:      Height:        Intake/Output Summary (Last 24 hours) at 06/18/2022 1228 Last data filed at 06/18/2022 1200 Gross per 24 hour  Intake 2825 ml  Output --  Net 2825 ml   Filed Weights   06/13/22 1220  Weight: (!) 156.5 kg    Examination:  General exam: Appears calm and comfortable  Respiratory system: Clear to auscultation. Respiratory effort normal. No respiratory distress. No conversational dyspnea.  Cardiovascular system: S1 & S2 heard, RRR. No murmurs. No pedal edema. Gastrointestinal system: Abdomen is nondistended, soft and nontender. Normal bowel sounds heard. Central nervous system: Alert and oriented. No focal neurological deficits. Speech clear.  Extremities: Symmetric in appearance  Skin: No rashes, lesions or ulcers on exposed skin  Psychiatry: Judgement and insight appear normal. Mood & affect appropriate.   Data Reviewed: I have personally reviewed following labs and imaging studies  CBC: Recent Labs  Lab 06/13/22 1306 06/14/22 0323 06/15/22 0757 06/16/22 0506 06/17/22 0451 06/17/22 1847 06/18/22 0434  WBC 9.9 6.5 2.6* 2.0* 2.5*  --  3.5*  NEUTROABS 8.6*  --   --   --   --   --  2.1  HGB 9.5* 8.4* 8.0* 7.4* 7.4* 9.3* 8.3*  HCT 30.9* 27.7* 27.2* 25.2* 24.6* 31.5* 27.8*  MCV 82.2 83.4 84.5 84.3 83.7  --  86.9  PLT 164 130* 101* 103* 94*  --  54*   Basic Metabolic Panel: Recent Labs  Lab 06/13/22 1306 06/14/22 0323 06/15/22 0757 06/16/22 0506 06/18/22 0909  NA 133* 134* 137 139 137  K 2.6* 3.5 3.6 4.0 3.9  CL 101 105 108 111 107  CO2 20* 21* 20* 21* 23  GLUCOSE 173* 110* 102* 91 138*  BUN 27* 26* 18 16 12   CREATININE 1.34* 0.99 0.81 0.67 0.74   CALCIUM 7.9* 7.9* 8.2* 8.2* 8.4*  MG 2.1  --   --   --   --    GFR: Estimated Creatinine Clearance: 138.5 mL/min (by C-G formula based on SCr of 0.74 mg/dL). Liver Function Tests: Recent Labs  Lab 06/13/22 1306 06/18/22 0909  AST 59* 39  ALT 26 31  ALKPHOS 54 60  BILITOT 0.8 0.6  PROT 7.4 5.9*  ALBUMIN 3.5 2.8*   Recent Labs  Lab 06/13/22 1306  LIPASE 25   No results for input(s): "AMMONIA" in the last 168 hours. Coagulation Profile: Recent Labs  Lab 06/13/22 1306 06/14/22 0323 06/16/22 0506 06/18/22 0909  INR 1.4* 1.4* 1.2 1.2   Cardiac Enzymes: No results for input(s): "CKTOTAL", "CKMB", "CKMBINDEX", "TROPONINI" in the last 168 hours. BNP (last 3 results) No results for input(s): "PROBNP" in the last 8760 hours. HbA1C: No results for input(s): "HGBA1C" in the last 72 hours. CBG: Recent Labs  Lab 06/17/22 1633 06/17/22 1711 06/17/22 2157 06/18/22 0709 06/18/22 1127  GLUCAP 64* 75 128* 87 94   Lipid Profile: No results for input(s): "CHOL", "HDL", "LDLCALC", "TRIG", "CHOLHDL", "LDLDIRECT" in the last 72 hours. Thyroid Function Tests: No results for input(s): "TSH", "T4TOTAL", "FREET4", "T3FREE", "THYROIDAB" in the last 72 hours. Anemia Panel: Recent Labs    06/17/22 1040 06/18/22 0909  VITAMINB12 314  --   FOLATE 11.2  --   FERRITIN 23*  --   TIBC 430  --   IRON 29*  --   RETICCTPCT 1.0 1.5   Sepsis Labs: Recent Labs  Lab 06/13/22 1306 06/13/22 1648 06/13/22 1956 06/13/22 2256  LATICACIDVEN 2.1* 7.9* 4.3* 1.1    Recent Results (from the past 240 hour(s))  Resp panel by RT-PCR (RSV, Flu A&B, Covid)     Status: None   Collection Time: 06/13/22  1:06 PM   Specimen: Nasal Swab  Result Value Ref Range Status   SARS Coronavirus 2 by RT PCR NEGATIVE NEGATIVE Final    Comment: (NOTE) SARS-CoV-2 target nucleic acids are NOT DETECTED.  The SARS-CoV-2 RNA is generally detectable in upper respiratory specimens during the acute phase of  infection. The lowest concentration of SARS-CoV-2 viral copies this assay can detect is 138 copies/mL. A negative result does not preclude SARS-Cov-2 infection and should not be used as the sole basis for treatment or other patient management decisions. A negative result may occur with  improper specimen collection/handling, submission of specimen other than nasopharyngeal swab, presence of viral mutation(s) within the areas targeted by this assay, and inadequate number of viral copies(<138 copies/mL). A negative result must be combined with clinical observations, patient history, and epidemiological information. The expected result is Negative.  Fact Sheet for Patients:  BloggerCourse.com  Fact Sheet for Healthcare Providers:  SeriousBroker.it  This test is no t yet approved or cleared by the [place] FDA and  has been authorized for detection and/or diagnosis of SARS-CoV-2 by FDA under an Emergency Use Authorization (EUA). This EUA will remain  in effect (meaning this test can be used) for the duration of the COVID-19 declaration under Section 564(b)(1) of the Act, 21 U.S.C.section 360bbb-3(b)(1), unless the authorization is terminated  or revoked sooner.       Influenza A by PCR NEGATIVE NEGATIVE Final   Influenza B by PCR NEGATIVE NEGATIVE Final    Comment: (NOTE) The Xpert Xpress SARS-CoV-2/FLU/RSV plus assay is intended as an aid in the diagnosis of influenza from Nasopharyngeal swab specimens and should not be used as a sole basis for treatment. Nasal washings and aspirates are unacceptable for Xpert Xpress SARS-CoV-2/FLU/RSV testing.  Fact Sheet for Patients: BloggerCourse.com  Fact Sheet for Healthcare Providers: SeriousBroker.it  This test is not yet approved or cleared by the [place] FDA and has been authorized for detection and/or diagnosis of SARS-CoV-2  by FDA under an Emergency Use Authorization (EUA). This EUA will remain in effect (meaning this test can be used) for the duration of the COVID-19 declaration under Section 564(b)(1) of the Act, 21 U.S.C. section 360bbb-3(b)(1), unless the authorization is terminated or revoked.  Resp Syncytial Virus by PCR NEGATIVE NEGATIVE Final    Comment: (NOTE) Fact Sheet for Patients: BloggerCourse.com  Fact Sheet for Healthcare Providers: SeriousBroker.it  This test is not yet approved or cleared by the [place] FDA and has been authorized for detection and/or diagnosis of SARS-CoV-2 by FDA under an Emergency Use Authorization (EUA). This EUA will remain in effect (meaning this test can be used) for the duration of the COVID-19 declaration under Section 564(b)(1) of the Act, 21 U.S.C. section 360bbb-3(b)(1), unless the authorization is terminated or revoked.  Performed at [facility], [address] [place], [place] [address]   Blood Culture (routine x 2)     Status: None   Collection Time: 06/13/22  1:08 PM   Specimen: BLOOD RIGHT HAND  Result Value Ref Range Status   Specimen Description   Final    BLOOD RIGHT HAND Performed at [facility], [address] [place], [place] [address]    Special Requests   Final    BOTTLES DRAWN AEROBIC AND ANAEROBIC Blood Culture results may not be optimal due to an inadequate volume of blood received in culture bottles Performed at [facility], [address] [place], [place] [address]    Culture   Final    NO GROWTH 5 DAYS Performed at [facility], [address] [place], [place] [address]    Report Status 06/18/2022 FINAL  Final  Blood Culture (routine x 2)     Status: None   Collection Time: 06/13/22  1:20 PM   Specimen: BLOOD  Result Value Ref Range Status   Specimen Description   Final    BLOOD LEFT SHOULDER Performed at [facility], [address] [place], [place] [address]    Special Requests   Final    BOTTLES DRAWN AEROBIC AND ANAEROBIC Blood Culture results may not be optimal due to an inadequate volume of blood received in culture bottles Performed at [facility], [address] [place], [place] [address]    Culture   Final    NO GROWTH 5 DAYS Performed at [facility], [address], [place], [place] [address]    Report Status 06/18/2022 FINAL  Final  Gastrointestinal Panel by PCR , Stool     Status: Abnormal   Collection Time: 06/14/22 10:25 AM   Specimen: Stool  Result Value Ref Range Status   Campylobacter species NOT DETECTED NOT DETECTED Final   Plesimonas shigelloides NOT DETECTED NOT DETECTED Final   Salmonella species NOT DETECTED NOT DETECTED Final   Yersinia enterocolitica NOT DETECTED NOT DETECTED Final   Vibrio species NOT DETECTED NOT DETECTED Final   Vibrio cholerae NOT DETECTED NOT DETECTED Final   Enteroaggregative E coli (EAEC) NOT DETECTED NOT DETECTED Final   Enteropathogenic E coli (EPEC) NOT DETECTED NOT DETECTED Final   Enterotoxigenic E coli (ETEC) NOT DETECTED NOT DETECTED Final   Shiga like toxin producing E coli (STEC) NOT DETECTED NOT DETECTED Final   Shigella/Enteroinvasive E coli (EIEC) NOT DETECTED NOT DETECTED Final   Cryptosporidium NOT DETECTED NOT DETECTED Final   Cyclospora cayetanensis NOT DETECTED NOT DETECTED Final   Entamoeba histolytica NOT DETECTED NOT DETECTED Final   Giardia lamblia NOT DETECTED NOT DETECTED Final   Adenovirus F40/41 NOT DETECTED NOT DETECTED Final   Astrovirus NOT DETECTED NOT DETECTED Final   Norovirus GI/GII NOT DETECTED NOT DETECTED Final   Rotavirus  A DETECTED (A) NOT DETECTED Final   Sapovirus (I, II, IV, and V) NOT DETECTED NOT DETECTED Final    Comment: Performed at [facility], [address] [place], [place] [address]  C Difficile Quick Screen w PCR reflex     Status: None   Collection  Time: 06/14/22 10:26 AM   Specimen: Stool  Result Value Ref Range Status   C Diff antigen NEGATIVE NEGATIVE Final   C Diff toxin NEGATIVE NEGATIVE Final   C Diff interpretation No C. difficile detected.  Final    Comment: Performed at [facility], [address], [place], [place] [address]      Radiology Studies: No results found.    Scheduled Meds:  aspirin EC  81 mg Oral Daily   atorvastatin  40 mg Oral QHS   buprenorphine  1 patch Transdermal Weekly   Chlorhexidine Gluconate Cloth  6 each Topical Q0600   feeding supplement  237 mL Oral BID BM   icosapent Ethyl  2 g Oral BID   insulin aspart  0-15 Units Subcutaneous TID WC   insulin aspart  0-5 Units Subcutaneous QHS   oxybutynin  10 mg Oral Daily   pregabalin  75 mg Oral BID   sodium chloride flush  3 mL Intravenous Q12H   Continuous Infusions:  lactated ringers Stopped (06/13/22 2119)     LOS: 5 days   Time spent: 35 minutes   Noralee Stain, DO [facility] 06/18/2022, 12:28 PM   Available via Epic secure chat 7am-7pm After these hours, please refer to coverage provider listed on [email or web address]

## 2022-06-19 DIAGNOSIS — K529 Noninfective gastroenteritis and colitis, unspecified: Secondary | ICD-10-CM | POA: Diagnosis not present

## 2022-06-19 LAB — CBC
HCT: 27 % — ABNORMAL LOW (ref 39.0–52.0)
Hemoglobin: 7.8 g/dL — ABNORMAL LOW (ref 13.0–17.0)
MCH: 25.6 pg — ABNORMAL LOW (ref 26.0–34.0)
MCHC: 28.9 g/dL — ABNORMAL LOW (ref 30.0–36.0)
MCV: 88.5 fL (ref 80.0–100.0)
Platelets: 109 10*3/uL — ABNORMAL LOW (ref 150–400)
RBC: 3.05 MIL/uL — ABNORMAL LOW (ref 4.22–5.81)
RDW: 15.7 % — ABNORMAL HIGH (ref 11.5–15.5)
WBC: 5.2 10*3/uL (ref 4.0–10.5)
nRBC: 0.4 % — ABNORMAL HIGH (ref 0.0–0.2)

## 2022-06-19 LAB — GLUCOSE, CAPILLARY: Glucose-Capillary: 75 mg/dL (ref 70–99)

## 2022-06-19 NOTE — Plan of Care (Signed)

## 2022-06-19 NOTE — Discharge Summary (Signed)
Physician Discharge Summary  [name] ZOX:[id number] DOB: 09-Oct-1955 DOA: 06/13/2022  PCP: Eunice Blase, PA-C  Admit date: 06/13/2022 Discharge date: 06/19/2022  Admitted From: Home Disposition:  Home   Recommendations for Outpatient Follow-up:  Follow up with PCP in 1 week Follow up with repeat CBC in 1 week to ensure stability of WBC, Hgb, Platelet  Discharge Condition: Stable CODE STATUS: Full  Diet recommendation: Carb modified   Brief/Interim Summary: Field Haberberger is a 67 year old male with history of chronic pain, diabetes mellitus and obesity who presents to the hospital with a fever and diarrhea. The patient states that he could not remember the events from Thursday night until Friday afternoon.  His wife has told him that he had a temperature of 104 degrees.  He admits to 2 days of diarrhea with a runny stool and about 5 episodes a day.  He states that his grandkids have had the same and feels that he has caught it from them.   He presented to the hospital with confusion and was noted to have a potassium level of 2.6, sodium 133 and creatinine 1.34. Temperature was noted to be 103.2, respiratory rate in the 20s.  WBC count was normal at 9.9. Lactic acid was initially elevated at 2.1, then 7.9 and slowly improved down to 1.1.   He was admitted in April for fever and resultant acute encephalopathy.  He was treated with IV antibiotics and subsequently placed on doxycycline.  No cause was found for his fever at that time.   He was admitted on 06/13/22 for severe sepsis secondary to gastroenteritis, tested positive for rotavirus. He was treated with supportive care with gradual improvement of his symptoms. He was monitored closely for pancytopenia, which was thought to be in combination of chronic leukopenia, chronic anemia, and thrombocytopenia due to sepsis. Lab work improved prior to discharge home.   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  His anemia is not new, ranging 9.9-12.5.  Thrombocytopenia could be in setting of severe sepsis. -CBC with improvement. Follow up outpatient.    Diabetes mellitus -Sliding scale insulin while inpatient    Hyperlipidemia -Lipitor, Vascepa   AKI -Resolved   Lactic acidosis -Resolved   Severe obesity -BMI 48   Chronic pain -Continue home medications, buprenorphine  Discharge Instructions  Discharge Instructions     Call MD for:   Complete by: As directed    Bleeding   Call MD for:  difficulty breathing, headache or visual disturbances   Complete by: As directed    Call MD for:  extreme fatigue   Complete by: As directed    Call MD for:  persistant dizziness or light-headedness   Complete by: As directed    Call MD for:  persistant nausea and vomiting   Complete by: As directed    Call MD for:  severe uncontrolled pain   Complete by: As directed    Call MD for:  temperature >100.4   Complete by: As directed    Diet Carb Modified   Complete by: As directed    Discharge instructions   Complete by: As directed    You were cared for by a hospitalist during your hospital stay. If you have any questions about your discharge medications or the care you received while you were in the hospital after you are discharged, you can call the unit and ask to speak with the hospitalist on call if the hospitalist that took care of you is not available. Once you are  discharged, your primary care physician will handle any further medical issues. Please note that NO REFILLS for any discharge medications will be authorized once you are discharged, as it is imperative that you return to your primary care physician (or establish a relationship with a primary care physician if you do not have one) for your aftercare needs so that they can reassess your need for medications and monitor your lab values.   Increase activity slowly   Complete by: As directed       Allergies as of 06/19/2022   No Known Allergies      Medication List     STOP taking these medications    doxycycline 100 MG capsule Commonly known as: VIBRAMYCIN       TAKE these medications    aspirin EC 81 MG tablet Take 81 mg by mouth daily. Swallow whole.   atorvastatin 40 MG tablet Commonly known as: LIPITOR Take 40 mg by mouth at bedtime.   Belbuca 300 MCG Film Generic drug: Buprenorphine HCl Place 300 mcg inside cheek every 12 (twelve) hours.   buprenorphine 20 MCG/HR Ptwk Commonly known as: BUTRANS Place 1 patch onto the skin every Monday.   etodolac 400 MG tablet Commonly known as: LODINE Take 400 mg by mouth 2 (two) times daily as needed for mild pain.   fluticasone 50 MCG/ACT nasal spray Commonly known as: FLONASE Place 1 spray into both nostrils as needed for allergies.   IRON PO Take 1 tablet by mouth daily.   metoprolol tartrate 25 MG tablet Commonly known as: LOPRESSOR Take 12.5 mg by mouth 2 (two) times daily.   omeprazole 40 MG capsule Commonly known as: PRILOSEC Take 40 mg by mouth daily before breakfast.   oxybutynin 10 MG 24 hr tablet Commonly known as: DITROPAN-XL Take 10 mg by mouth in the morning.   oxyCODONE-acetaminophen 5-325 MG tablet Commonly known as: PERCOCET/ROXICET Take 1 tablet by mouth as needed (for pain).   pregabalin 75 MG capsule Commonly known as: LYRICA Take 75 mg by mouth in the morning, at noon, and at bedtime.    silver sulfADIAZINE 1 % cream Commonly known as: SILVADENE Apply 1 Application topically daily as needed (for wound care).   triamterene-hydrochlorothiazide 75-50 MG tablet Commonly known as: MAXZIDE Take 1 tablet by mouth daily.   Vascepa 1 g capsule Generic drug: icosapent Ethyl Take 2 g by mouth in the morning and at bedtime.   Vitamin D (Ergocalciferol) 1.25 MG (50000 UNIT) Caps capsule Commonly known as: DRISDOL Take 50,000 Units by mouth every Sunday.   Xigduo XR 05-998 MG Tb24 Generic drug: Dapagliflozin Pro-metFORMIN ER Take 2 tablets by mouth in the morning.        Follow-up Information     O'Buch, Edgardo Roys, PA-C. Schedule an appointment as soon as possible for a visit in 1 week(s).   Specialty: Internal Medicine Contact information: [address] [place]  [place] [address] [phone]                No Known Allergies    Procedures/Studies: DG Chest Port 1 View  Result Date: 06/13/2022 CLINICAL DATA:  Questionable sepsis - evaluate for abnormality EXAM: PORTABLE CHEST 1 VIEW COMPARISON:  April 19, 24. FINDINGS: Limited low lung volume study with streaky left basilar opacities. Right lung is clear. No visible effusions or pneumothorax. Cardiomediastinal silhouette is accentuated by technique. 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 IMPRESSION: Limited study with mild streaky left basilar opacities, favor atelectasis. Dedicated PA and lateral chest comparison is warranted. Electronically Signed   By: Feliberto Harts M.D.   On: 06/13/2022 13:36       Discharge Exam: Vitals:   06/18/22 2115 06/19/22 0524  BP: (!) 123/49 (!) 110/54  Pulse: 61 64  Resp: 19 16  Temp: 98.3 F (36.8 C) 98.2 F (36.8 C)  SpO2: 100% 98%    General: Pt is alert, awake, not in acute distress Cardiovascular: RRR, S1/S2 +, no edema Respiratory: CTA bilaterally, no wheezing, no rhonchi, no respiratory distress, no conversational dyspnea   Abdominal: Soft, NT, ND, bowel sounds + Extremities: no edema, no cyanosis Psych: Normal mood and affect, stable judgement and insight     The results of significant diagnostics from this hospitalization (including imaging, microbiology, ancillary and laboratory) are listed below for reference.     Microbiology: Recent Results (from the past 240 hour(s))  Resp panel by RT-PCR (RSV, Flu A&B, Covid)     Status: None   Collection Time: 06/13/22  1:06 PM   Specimen: Nasal Swab  Result Value Ref Range Status   SARS Coronavirus 2 by RT PCR NEGATIVE NEGATIVE Final    Comment: (NOTE) SARS-CoV-2 target nucleic acids are NOT DETECTED.  The SARS-CoV-2 RNA is generally detectable in upper respiratory specimens during the acute phase of infection. The lowest concentration of SARS-CoV-2 viral copies this assay can detect is 138 copies/mL. A negative result does not preclude SARS-Cov-2 infection and should not be used as the sole basis for treatment or other patient management decisions. A negative result may occur with  improper specimen collection/handling, submission of specimen other than nasopharyngeal swab, presence of viral mutation(s) within the areas targeted by this assay, and inadequate number of viral copies(<138 copies/mL). A negative result must be combined with clinical observations, patient history, and epidemiological information. The expected result is Negative.  Fact Sheet for Patients:  BloggerCourse.com  Fact Sheet for Healthcare Providers:  SeriousBroker.it  This test is no t yet approved or cleared by the [place] FDA and  has been authorized for detection and/or diagnosis of SARS-CoV-2 by FDA under an Emergency Use Authorization (EUA). This EUA will remain  in effect (meaning this test can be used) for the duration of the COVID-19 declaration under Section 564(b)(1) of the Act, 21 U.S.C.section 360bbb-3(b)(1),  unless the authorization is terminated  or revoked sooner.       Influenza A by PCR NEGATIVE NEGATIVE Final   Influenza B by PCR NEGATIVE NEGATIVE Final    Comment: (NOTE) The Xpert Xpress SARS-CoV-2/FLU/RSV plus assay is intended as an aid in the diagnosis of influenza from Nasopharyngeal swab specimens and should not be used as a sole basis for treatment. Nasal washings and aspirates are unacceptable for Xpert Xpress SARS-CoV-2/FLU/RSV testing.  Fact Sheet for Patients: BloggerCourse.com  Fact Sheet for Healthcare Providers: SeriousBroker.it  This test is not yet approved or cleared by the [place] FDA and has been authorized for detection and/or diagnosis of SARS-CoV-2 by FDA under an Emergency Use Authorization (EUA). This EUA will remain in effect (meaning this test can be used) for the duration of the COVID-19 declaration under Section 564(b)(1) of the Act, 21 U.S.C. section 360bbb-3(b)(1), unless the authorization is terminated or revoked.     Resp Syncytial Virus by PCR NEGATIVE NEGATIVE Final    Comment: (NOTE) Fact Sheet for Patients: BloggerCourse.com  Fact Sheet for Healthcare Providers: SeriousBroker.it  This test is not yet approved or cleared by the [place] FDA and has been authorized for detection and/or diagnosis of SARS-CoV-2 by FDA under an Emergency Use Authorization (EUA). This EUA will remain in effect (meaning this test can be used) for the duration of the COVID-19 declaration under Section 564(b)(1) of the Act, 21 U.S.C. section 360bbb-3(b)(1), unless the authorization is terminated or revoked.  Performed at [facility], [address] [place], [place] [address]   Blood Culture (routine x 2)     Status: None   Collection Time: 06/13/22  1:08 PM   Specimen: BLOOD RIGHT HAND  Result Value Ref Range Status   Specimen  Description   Final    BLOOD RIGHT HAND Performed at [facility], [address] [place], [place] [address]    Special Requests   Final    BOTTLES DRAWN AEROBIC AND ANAEROBIC Blood Culture results may not be optimal due to an inadequate volume of blood received in culture bottles Performed at [facility], [address] [place], [place] [address]    Culture   Final    NO GROWTH 5 DAYS Performed at [facility], [address] [place], [place] [address]    Report Status 06/18/2022 FINAL  Final  Blood Culture (routine x 2)     Status: None   Collection Time: 06/13/22  1:20 PM   Specimen: BLOOD  Result Value Ref Range Status   Specimen Description   Final    BLOOD LEFT SHOULDER Performed at [facility], [address] [place], [place] [address]    Special Requests   Final    BOTTLES DRAWN AEROBIC AND ANAEROBIC Blood Culture results may not be optimal due to an inadequate volume of blood received in culture bottles Performed at [facility], [address] [place], [place] [address]    Culture   Final    NO GROWTH 5 DAYS Performed at [facility], [address], [place], [place] [address]    Report Status 06/18/2022 FINAL  Final  Gastrointestinal Panel by PCR , Stool     Status: Abnormal   Collection Time: 06/14/22 10:25 AM   Specimen: Stool  Result Value Ref Range Status   Campylobacter species NOT DETECTED NOT DETECTED Final   Plesimonas shigelloides NOT DETECTED NOT DETECTED Final   Salmonella species NOT DETECTED NOT DETECTED Final   Yersinia enterocolitica NOT DETECTED NOT DETECTED Final   Vibrio species NOT DETECTED NOT DETECTED Final   Vibrio cholerae NOT DETECTED NOT DETECTED Final   Enteroaggregative E coli (EAEC) NOT DETECTED NOT DETECTED Final   Enteropathogenic E coli (EPEC) NOT DETECTED NOT DETECTED Final   Enterotoxigenic E coli (ETEC) NOT DETECTED NOT DETECTED Final   Shiga like toxin producing E  coli (STEC) NOT DETECTED NOT DETECTED Final   Shigella/Enteroinvasive E coli (EIEC) NOT DETECTED NOT DETECTED Final   Cryptosporidium  NOT DETECTED NOT DETECTED Final   Cyclospora cayetanensis NOT DETECTED NOT DETECTED Final   Entamoeba histolytica NOT DETECTED NOT DETECTED Final   Giardia lamblia NOT DETECTED NOT DETECTED Final   Adenovirus F40/41 NOT DETECTED NOT DETECTED Final   Astrovirus NOT DETECTED NOT DETECTED Final   Norovirus GI/GII NOT DETECTED NOT DETECTED Final   Rotavirus A DETECTED (A) NOT DETECTED Final   Sapovirus (I, II, IV, and V) NOT DETECTED NOT DETECTED Final    Comment: Performed at [facility], [address] [place], [place] [address]  C Difficile Quick Screen w PCR reflex     Status: None   Collection Time: 06/14/22 10:26 AM   Specimen: Stool  Result Value Ref Range Status   C Diff antigen NEGATIVE NEGATIVE Final   C Diff toxin NEGATIVE NEGATIVE Final   C Diff interpretation No C. difficile detected.  Final    Comment: Performed at [facility], [address] [place], [place] [address]     Labs: BNP (last 3 results) No results for input(s): "BNP" in the last 8760 hours. Basic Metabolic Panel: Recent Labs  Lab 06/13/22 1306 06/14/22 0323 06/15/22 0757 06/16/22 0506 06/18/22 0909  NA 133* 134* 137 139 137  K 2.6* 3.5 3.6 4.0 3.9  CL 101 105 108 111 107  CO2 20* 21* 20* 21* 23  GLUCOSE 173* 110* 102* 91 138*  BUN 27* 26* 18 16 12   CREATININE 1.34* 0.99 0.81 0.67 0.74  CALCIUM 7.9* 7.9* 8.2* 8.2* 8.4*  MG 2.1  --   --   --   --    Liver Function Tests: Recent Labs  Lab 06/13/22 1306 06/18/22 0909  AST 59* 39  ALT 26 31  ALKPHOS 54 60  BILITOT 0.8 0.6  PROT 7.4 5.9*  ALBUMIN 3.5 2.8*   Recent Labs  Lab 06/13/22 1306  LIPASE 25   No results for input(s): "AMMONIA" in the last 168 hours. CBC: Recent Labs  Lab 06/13/22 1306 06/14/22 0323 06/15/22 0757 06/16/22 0506 06/17/22 0451 06/17/22 1847  06/18/22 0434 06/19/22 0501  WBC 9.9   < > 2.6* 2.0* 2.5*  --  3.5* 5.2  NEUTROABS 8.6*  --   --   --   --   --  2.1  --   HGB 9.5*   < > 8.0* 7.4* 7.4* 9.3* 8.3* 7.8*  HCT 30.9*   < > 27.2* 25.2* 24.6* 31.5* 27.8* 27.0*  MCV 82.2   < > 84.5 84.3 83.7  --  86.9 88.5  PLT 164   < > 101* 103* 94*  --  54* 109*   < > = values in this interval not displayed.   Cardiac Enzymes: No results for input(s): "CKTOTAL", "CKMB", "CKMBINDEX", "TROPONINI" in the last 168 hours. BNP: Invalid input(s): "POCBNP" CBG: Recent Labs  Lab 06/18/22 0709 06/18/22 1127 06/18/22 1636 06/18/22 2120 06/19/22 0724  GLUCAP 87 94 119* 143* 75   D-Dimer No results for input(s): "DDIMER" in the last 72 hours. Hgb A1c No results for input(s): "HGBA1C" in the last 72 hours. Lipid Profile No results for input(s): "CHOL", "HDL", "LDLCALC", "TRIG", "CHOLHDL", "LDLDIRECT" in the last 72 hours. Thyroid function studies No results for input(s): "TSH", "T4TOTAL", "T3FREE", "THYROIDAB" in the last 72 hours.  Invalid input(s): "FREET3" Anemia work up Recent Labs    06/17/22 1040 06/18/22 0909  VITAMINB12 314  --   FOLATE 11.2  --   FERRITIN 23*  --  TIBC 430  --   IRON 29*  --   RETICCTPCT 1.0 1.5   Urinalysis    Component Value Date/Time   COLORURINE YELLOW 06/13/2022 1716   APPEARANCEUR CLEAR 06/13/2022 1716   LABSPEC 1.025 06/13/2022 1716   PHURINE 5.5 06/13/2022 1716   GLUCOSEU >=500 (A) 06/13/2022 1716   HGBUR LARGE (A) 06/13/2022 1716   BILIRUBINUR NEGATIVE 06/13/2022 1716   KETONESUR NEGATIVE 06/13/2022 1716   PROTEINUR 100 (A) 06/13/2022 1716   UROBILINOGEN 0.2 06/14/2011 1818   NITRITE NEGATIVE 06/13/2022 1716   LEUKOCYTESUR NEGATIVE 06/13/2022 1716   Sepsis Labs Recent Labs  Lab 06/16/22 0506 06/17/22 0451 06/18/22 0434 06/19/22 0501  WBC 2.0* 2.5* 3.5* 5.2   Microbiology Recent Results (from the past 240 hour(s))  Resp panel by RT-PCR (RSV, Flu A&B, Covid)     Status: None    Collection Time: 06/13/22  1:06 PM   Specimen: Nasal Swab  Result Value Ref Range Status   SARS Coronavirus 2 by RT PCR NEGATIVE NEGATIVE Final    Comment: (NOTE) SARS-CoV-2 target nucleic acids are NOT DETECTED.  The SARS-CoV-2 RNA is generally detectable in upper respiratory specimens during the acute phase of infection. The lowest concentration of SARS-CoV-2 viral copies this assay can detect is 138 copies/mL. A negative result does not preclude SARS-Cov-2 infection and should not be used as the sole basis for treatment or other patient management decisions. A negative result may occur with  improper specimen collection/handling, submission of specimen other than nasopharyngeal swab, presence of viral mutation(s) within the areas targeted by this assay, and inadequate number of viral copies(<138 copies/mL). A negative result must be combined with clinical observations, patient history, and epidemiological information. The expected result is Negative.  Fact Sheet for Patients:  BloggerCourse.com  Fact Sheet for Healthcare Providers:  SeriousBroker.it  This test is no t yet approved or cleared by the [place] FDA and  has been authorized for detection and/or diagnosis of SARS-CoV-2 by FDA under an Emergency Use Authorization (EUA). This EUA will remain  in effect (meaning this test can be used) for the duration of the COVID-19 declaration under Section 564(b)(1) of the Act, 21 U.S.C.section 360bbb-3(b)(1), unless the authorization is terminated  or revoked sooner.       Influenza A by PCR NEGATIVE NEGATIVE Final   Influenza B by PCR NEGATIVE NEGATIVE Final    Comment: (NOTE) The Xpert Xpress SARS-CoV-2/FLU/RSV plus assay is intended as an aid in the diagnosis of influenza from Nasopharyngeal swab specimens and should not be used as a sole basis for treatment. Nasal washings and aspirates are unacceptable for Xpert Xpress  SARS-CoV-2/FLU/RSV testing.  Fact Sheet for Patients: BloggerCourse.com  Fact Sheet for Healthcare Providers: SeriousBroker.it  This test is not yet approved or cleared by the [place] FDA and has been authorized for detection and/or diagnosis of SARS-CoV-2 by FDA under an Emergency Use Authorization (EUA). This EUA will remain in effect (meaning this test can be used) for the duration of the COVID-19 declaration under Section 564(b)(1) of the Act, 21 U.S.C. section 360bbb-3(b)(1), unless the authorization is terminated or revoked.     Resp Syncytial Virus by PCR NEGATIVE NEGATIVE Final    Comment: (NOTE) Fact Sheet for Patients: BloggerCourse.com  Fact Sheet for Healthcare Providers: SeriousBroker.it  This test is not yet approved or cleared by the [place] FDA and has been authorized for detection and/or diagnosis of SARS-CoV-2 by FDA under an Emergency Use Authorization (EUA). This EUA will  remain in effect (meaning this test can be used) for the duration of the COVID-19 declaration under Section 564(b)(1) of the Act, 21 U.S.C. section 360bbb-3(b)(1), unless the authorization is terminated or revoked.  Performed at [facility], [address] [place], [place] [address]   Blood Culture (routine x 2)     Status: None   Collection Time: 06/13/22  1:08 PM   Specimen: BLOOD RIGHT HAND  Result Value Ref Range Status   Specimen Description   Final    BLOOD RIGHT HAND Performed at [facility], [address] [place], [place] [address]    Special Requests   Final    BOTTLES DRAWN AEROBIC AND ANAEROBIC Blood Culture results may not be optimal due to an inadequate volume of blood received in culture bottles Performed at [facility], [address] [place], [place] [address]    Culture   Final    NO GROWTH 5 DAYS Performed at  [facility], [address] [place], [place] [address]    Report Status 06/18/2022 FINAL  Final  Blood Culture (routine x 2)     Status: None   Collection Time: 06/13/22  1:20 PM   Specimen: BLOOD  Result Value Ref Range Status   Specimen Description   Final    BLOOD LEFT SHOULDER Performed at [facility], [address] [place], [place] [address]    Special Requests   Final    BOTTLES DRAWN AEROBIC AND ANAEROBIC Blood Culture results may not be optimal due to an inadequate volume of blood received in culture bottles Performed at [facility], [address] [place], [place] [address]    Culture   Final    NO GROWTH 5 DAYS Performed at [facility], [address] [place], [place] [address]    Report Status 06/18/2022 FINAL  Final  Gastrointestinal Panel by PCR , Stool     Status: Abnormal   Collection Time: 06/14/22 10:25 AM   Specimen: Stool  Result Value Ref Range Status   Campylobacter species NOT DETECTED NOT DETECTED Final   Plesimonas shigelloides NOT DETECTED NOT DETECTED Final   Salmonella species NOT DETECTED NOT DETECTED Final   Yersinia enterocolitica NOT DETECTED NOT DETECTED Final   Vibrio species NOT DETECTED NOT DETECTED Final   Vibrio cholerae NOT DETECTED NOT DETECTED Final   Enteroaggregative E coli (EAEC) NOT DETECTED NOT DETECTED Final   Enteropathogenic E coli (EPEC) NOT DETECTED NOT DETECTED Final   Enterotoxigenic E coli (ETEC) NOT DETECTED NOT DETECTED Final   Shiga like toxin producing E coli (STEC) NOT DETECTED NOT DETECTED Final   Shigella/Enteroinvasive E coli (EIEC) NOT DETECTED NOT DETECTED Final   Cryptosporidium NOT DETECTED NOT DETECTED Final   Cyclospora cayetanensis NOT DETECTED NOT DETECTED Final   Entamoeba histolytica NOT DETECTED NOT DETECTED Final   Giardia lamblia NOT DETECTED NOT DETECTED Final   Adenovirus F40/41 NOT DETECTED NOT DETECTED Final   Astrovirus NOT DETECTED NOT DETECTED Final    Norovirus GI/GII NOT DETECTED NOT DETECTED Final   Rotavirus A DETECTED (A) NOT DETECTED Final   Sapovirus (I, II, IV, and V) NOT DETECTED NOT DETECTED Final    Comment: Performed at [facility], [address], [place], [place] [address]  C Difficile Quick Screen w PCR reflex     Status: None   Collection Time: 06/14/22 10:26 AM   Specimen:  Stool  Result Value Ref Range Status   C Diff antigen NEGATIVE NEGATIVE Final   C Diff toxin NEGATIVE NEGATIVE Final   C Diff interpretation No C. difficile detected.  Final    Comment: Performed at [facility], [address], [place], [place] [address]     Patient was seen and examined on the day of discharge and was found to be in stable condition. Time coordinating discharge: 25 minutes including assessment and coordination of care, as well as examination of the patient.   SIGNED:  Noralee Stain, DO [facility] 06/19/2022, 8:15 AM

## 2022-06-19 NOTE — Progress Notes (Signed)
Pt was discharged home today. Instructions were reviewed with patient, and questions were answered. Pt was taken to main entrance via wheelchair by NT.  

## 2022-06-23 DIAGNOSIS — Z9889 Other specified postprocedural states: Secondary | ICD-10-CM | POA: Diagnosis not present

## 2022-06-23 DIAGNOSIS — Z79891 Long term (current) use of opiate analgesic: Secondary | ICD-10-CM | POA: Diagnosis not present

## 2022-06-23 DIAGNOSIS — M549 Dorsalgia, unspecified: Secondary | ICD-10-CM | POA: Diagnosis not present

## 2022-06-23 DIAGNOSIS — M5417 Radiculopathy, lumbosacral region: Secondary | ICD-10-CM | POA: Diagnosis not present

## 2022-06-23 DIAGNOSIS — M533 Sacrococcygeal disorders, not elsewhere classified: Secondary | ICD-10-CM | POA: Diagnosis not present

## 2022-06-24 DIAGNOSIS — C61 Malignant neoplasm of prostate: Secondary | ICD-10-CM | POA: Diagnosis not present

## 2022-06-25 DIAGNOSIS — Z79899 Other long term (current) drug therapy: Secondary | ICD-10-CM | POA: Diagnosis not present

## 2022-06-25 DIAGNOSIS — Z6841 Body Mass Index (BMI) 40.0 and over, adult: Secondary | ICD-10-CM | POA: Diagnosis not present

## 2022-06-25 DIAGNOSIS — D61818 Other pancytopenia: Secondary | ICD-10-CM | POA: Diagnosis not present

## 2022-06-25 DIAGNOSIS — A080 Rotaviral enteritis: Secondary | ICD-10-CM | POA: Diagnosis not present

## 2022-06-25 DIAGNOSIS — A419 Sepsis, unspecified organism: Secondary | ICD-10-CM | POA: Diagnosis not present

## 2022-06-26 DIAGNOSIS — I440 Atrioventricular block, first degree: Secondary | ICD-10-CM | POA: Diagnosis not present

## 2022-06-26 DIAGNOSIS — N99112 Postprocedural membranous urethral stricture: Secondary | ICD-10-CM | POA: Diagnosis not present

## 2022-07-21 DIAGNOSIS — Z96651 Presence of right artificial knee joint: Secondary | ICD-10-CM | POA: Diagnosis not present

## 2022-07-21 DIAGNOSIS — M25552 Pain in left hip: Secondary | ICD-10-CM | POA: Diagnosis not present

## 2022-07-21 DIAGNOSIS — Z96653 Presence of artificial knee joint, bilateral: Secondary | ICD-10-CM | POA: Diagnosis not present

## 2022-07-21 DIAGNOSIS — Z96652 Presence of left artificial knee joint: Secondary | ICD-10-CM | POA: Diagnosis not present

## 2022-07-28 DIAGNOSIS — R739 Hyperglycemia, unspecified: Secondary | ICD-10-CM | POA: Diagnosis not present

## 2022-07-28 DIAGNOSIS — E782 Mixed hyperlipidemia: Secondary | ICD-10-CM | POA: Diagnosis not present

## 2022-07-28 DIAGNOSIS — Z139 Encounter for screening, unspecified: Secondary | ICD-10-CM | POA: Diagnosis not present

## 2022-07-28 DIAGNOSIS — E559 Vitamin D deficiency, unspecified: Secondary | ICD-10-CM | POA: Diagnosis not present

## 2022-07-28 DIAGNOSIS — D649 Anemia, unspecified: Secondary | ICD-10-CM | POA: Diagnosis not present

## 2022-07-28 DIAGNOSIS — Z79899 Other long term (current) drug therapy: Secondary | ICD-10-CM | POA: Diagnosis not present

## 2022-07-28 DIAGNOSIS — I4891 Unspecified atrial fibrillation: Secondary | ICD-10-CM | POA: Diagnosis not present

## 2022-09-01 ENCOUNTER — Ambulatory Visit (INDEPENDENT_AMBULATORY_CARE_PROVIDER_SITE_OTHER): Payer: BC Managed Care – PPO | Admitting: Orthopaedic Surgery

## 2022-09-01 ENCOUNTER — Encounter: Payer: Self-pay | Admitting: Orthopaedic Surgery

## 2022-09-01 ENCOUNTER — Telehealth: Payer: Self-pay

## 2022-09-01 ENCOUNTER — Other Ambulatory Visit (INDEPENDENT_AMBULATORY_CARE_PROVIDER_SITE_OTHER): Payer: BC Managed Care – PPO

## 2022-09-01 VITALS — Ht 71.0 in | Wt 320.0 lb

## 2022-09-01 DIAGNOSIS — M25552 Pain in left hip: Secondary | ICD-10-CM

## 2022-09-01 DIAGNOSIS — M1612 Unilateral primary osteoarthritis, left hip: Secondary | ICD-10-CM | POA: Diagnosis not present

## 2022-09-01 NOTE — Progress Notes (Signed)
The patient is a very pleasant 67 year old gentleman who is in a tough situation.  He has debilitating pain and arthritis as it relates to his left hip and that is been well-documented.  Unfortunately his BMI is almost 45.  He was seen at [facility] and they have let him know that his BMI needs to be below 40 before proceeding with surgery.  According to the chart here it looks like he is part of the [facility] and there is BMI restrictions related to that that is also below 40.  He does ambulate with a rolling walker.  His pain is 10 out of 10 and is daily.  He cannot sleep well at night either.  On exam he has a large abdomen and it does hang over his incision but it is easily mobile and I can mobilize his pannus.  His thigh is not very large.  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  I agree that he needs a hip replacement the right now my hands are tied in terms of the weight restrictions.  I can certainly see him back in 3 months for repeat weight and BMI calculation but I am at a loss of what I can do right now in terms of scheduling any type of surgery until further weight loss is documented.  I tried to explain that is much as I could to his disappointment which I had totally and completely understand and empathize with.  He does understand that there is significantly heightened risk of problems as it relates to the morbidly obese and joint replacement surgery in terms of wound breakdown, infection and implant failure.  I have had those types of complications and we have to be as cautious as possible before proceeding with this type of surgery.

## 2022-09-01 NOTE — Telephone Encounter (Signed)
Dr. Magnus Ivan wanted me to ask you to check if this patient is truly [facility]?

## 2022-09-04 ENCOUNTER — Telehealth: Payer: Self-pay | Admitting: Orthopaedic Surgery

## 2022-09-04 NOTE — Telephone Encounter (Signed)
Called patient left message to return call to schedule a 6 wk appointment with Dr. Magnus Ivan     [phone]

## 2022-09-09 ENCOUNTER — Encounter (HOSPITAL_BASED_OUTPATIENT_CLINIC_OR_DEPARTMENT_OTHER): Payer: Self-pay | Admitting: Emergency Medicine

## 2022-09-09 ENCOUNTER — Emergency Department (HOSPITAL_BASED_OUTPATIENT_CLINIC_OR_DEPARTMENT_OTHER)
Admission: EM | Admit: 2022-09-09 | Discharge: 2022-09-09 | Disposition: A | Discharge status: Home / Self Care | Payer: BC Managed Care – PPO | Attending: Emergency Medicine | Admitting: Emergency Medicine

## 2022-09-09 ENCOUNTER — Other Ambulatory Visit: Payer: Self-pay

## 2022-09-09 DIAGNOSIS — M7989 Other specified soft tissue disorders: Secondary | ICD-10-CM | POA: Diagnosis not present

## 2022-09-09 DIAGNOSIS — R2240 Localized swelling, mass and lump, unspecified lower limb: Secondary | ICD-10-CM | POA: Diagnosis not present

## 2022-09-09 DIAGNOSIS — I2510 Atherosclerotic heart disease of native coronary artery without angina pectoris: Secondary | ICD-10-CM | POA: Insufficient documentation

## 2022-09-09 DIAGNOSIS — I10 Essential (primary) hypertension: Secondary | ICD-10-CM | POA: Diagnosis not present

## 2022-09-09 DIAGNOSIS — Z79899 Other long term (current) drug therapy: Secondary | ICD-10-CM | POA: Diagnosis not present

## 2022-09-09 DIAGNOSIS — L03115 Cellulitis of right lower limb: Secondary | ICD-10-CM | POA: Diagnosis not present

## 2022-09-09 DIAGNOSIS — E119 Type 2 diabetes mellitus without complications: Secondary | ICD-10-CM | POA: Insufficient documentation

## 2022-09-09 DIAGNOSIS — R2241 Localized swelling, mass and lump, right lower limb: Secondary | ICD-10-CM | POA: Diagnosis not present

## 2022-09-09 DIAGNOSIS — Z7982 Long term (current) use of aspirin: Secondary | ICD-10-CM | POA: Diagnosis not present

## 2022-09-09 LAB — CBC
HCT: 29.8 % — ABNORMAL LOW (ref 39.0–52.0)
Hemoglobin: 9.4 g/dL — ABNORMAL LOW (ref 13.0–17.0)
MCH: 26.6 pg (ref 26.0–34.0)
MCHC: 31.5 g/dL (ref 30.0–36.0)
MCV: 84.4 fL (ref 80.0–100.0)
Platelets: 193 10*3/uL (ref 150–400)
RBC: 3.53 MIL/uL — ABNORMAL LOW (ref 4.22–5.81)
RDW: 16.2 % — ABNORMAL HIGH (ref 11.5–15.5)
WBC: 4.3 10*3/uL (ref 4.0–10.5)
nRBC: 0 % (ref 0.0–0.2)

## 2022-09-09 LAB — BASIC METABOLIC PANEL
Anion gap: 10 (ref 5–15)
BUN: 16 mg/dL (ref 8–23)
CO2: 24 mmol/L (ref 22–32)
Calcium: 8.6 mg/dL — ABNORMAL LOW (ref 8.9–10.3)
Chloride: 99 mmol/L (ref 98–111)
Creatinine, Ser: 0.82 mg/dL (ref 0.61–1.24)
GFR, Estimated: >60 mL/min (ref >60)
Glucose, Bld: 94 mg/dL (ref 70–99)
Potassium: 4 mmol/L (ref 3.5–5.1)
Sodium: 133 mmol/L — ABNORMAL LOW (ref 135–145)

## 2022-09-09 LAB — D-DIMER, QUANTITATIVE: D-Dimer, Quant: 1.84 ug{FEU}/mL — ABNORMAL HIGH (ref 0.00–0.50)

## 2022-09-09 MED ORDER — APIXABAN 2.5 MG PO TABS
10.0000 mg | ORAL_TABLET | Freq: Once | ORAL | Status: AC
  Administered 2022-09-09: 10 mg via ORAL

## 2022-09-09 MED ORDER — CEPHALEXIN 250 MG PO CAPS
500.0000 mg | ORAL_CAPSULE | Freq: Once | ORAL | Status: AC
  Administered 2022-09-09: 500 mg via ORAL
  Filled 2022-09-09: qty 2

## 2022-09-09 MED ORDER — CEPHALEXIN 500 MG PO CAPS: 500.0000 mg | ORAL_CAPSULE | Freq: Three times a day (TID) | ORAL | 0 refills | Status: DC

## 2022-09-09 MED ORDER — APIXABAN 2.5 MG PO TABS
10.0000 mg | ORAL_TABLET | Freq: Once | ORAL | Status: DC
  Filled 2022-09-09: qty 4

## 2022-09-09 NOTE — ED Triage Notes (Signed)
Patient arrived via POV c/o right leg swelling x 36 hr pta. Patient states 3/10 pain. Patient is AO x 4, VS WDL, unable to walk at this time.

## 2022-09-09 NOTE — ED Provider Notes (Signed)
EMERGENCY DEPARTMENT AT [facility] Provider Note   CSN: [id number] Arrival date & time: 09/09/22  2053     History  Chief Complaint  Patient presents with   Leg Swelling    [name] is a 67 y.o. male.  Pt is a 67 yo male with pmhx significant for DM, DJD, depression, cad, kidney stones, enlarged prostate, htn, hld, and osa.  Pt did sustain a lac to his right ankle a few days ago.  He noticed redness and swelling to right leg 36 hrs ago.  He feels like pain is getting worse.  Pt denies sob or cp.         Home Medications Prior to Admission medications   Medication Sig Start Date End Date Taking? Authorizing Provider  cephALEXin (KEFLEX) 500 MG capsule Take 1 capsule (500 mg total) by mouth 3 (three) times daily. 09/09/22  Yes Jacalyn Lefevre, MD  aspirin EC 81 MG tablet Take 81 mg by mouth daily. Swallow whole.    [provider]  atorvastatin (LIPITOR) 40 MG tablet Take 40 mg by mouth at bedtime.    [provider]  buprenorphine Lavera Guise) 20 MCG/HR PTWK Place 1 patch onto the skin every Monday.    [provider]  Buprenorphine HCl (BELBUCA) 300 MCG FILM Place 300 mcg inside cheek every 12 (twelve) hours.    [provider]  etodolac (LODINE) 400 MG tablet Take 400 mg by mouth 2 (two) times daily as needed for mild pain. 06/02/22   [provider]  Ferrous Sulfate (IRON PO) Take 1 tablet by mouth daily.    [provider]  fluticasone (FLONASE) 50 MCG/ACT nasal spray Place 1 spray into both nostrils as needed for allergies.    [provider]  metoprolol tartrate (LOPRESSOR) 25 MG tablet Take 12.5 mg by mouth 2 (two) times daily.    [provider]  omeprazole (PRILOSEC) 40 MG capsule Take 40 mg by mouth daily before breakfast.    [provider]  oxybutynin (DITROPAN-XL) 10 MG 24 hr tablet Take 10 mg by mouth in the morning.    [provider]   oxyCODONE-acetaminophen (PERCOCET/ROXICET) 5-325 MG tablet Take 1 tablet by mouth as needed (for pain).    [provider]  pregabalin (LYRICA) 75 MG capsule Take 75 mg by mouth in the morning, at noon, and at bedtime.    [provider]  silver sulfADIAZINE (SILVADENE) 1 % cream Apply 1 Application topically daily as needed (for wound care).    [provider]  triamterene-hydrochlorothiazide (MAXZIDE) 75-50 MG per tablet Take 1 tablet by mouth daily.    [provider]  VASCEPA 1 g capsule Take 2 g by mouth in the morning and at bedtime.    [provider]  Vitamin D, Ergocalciferol, (DRISDOL) 50000 UNITS CAPS capsule Take 50,000 Units by mouth every Sunday.    [provider]  XIGDUO XR 05-998 MG TB24 Take 2 tablets by mouth in the morning.    [provider]      Allergies    Patient has no known allergies.    Review of Systems   Review of Systems  Cardiovascular:  Positive for leg swelling.  All other systems reviewed and are negative.   Physical Exam Updated Vital Signs BP (!) 137/51 (BP Location: Left Arm)   Pulse 72   Temp 98.2 F (36.8 C)   Resp 18   Ht 5\' 10"  (1.778 m)  Wt (!) 137.4 kg   SpO2 100%   BMI 43.48 kg/m  Physical Exam Vitals and nursing note reviewed.  Constitutional:      Appearance: Normal appearance.  HENT:     Head: Normocephalic and atraumatic.     Right Ear: External ear normal.     Left Ear: External ear normal.     Nose: Nose normal.     Mouth/Throat:     Mouth: Mucous membranes are moist.     Pharynx: Oropharynx is clear.  Eyes:     Extraocular Movements: Extraocular movements intact.     Conjunctiva/sclera: Conjunctivae normal.     Pupils: Pupils are equal, round, and reactive to light.  Cardiovascular:     Rate and Rhythm: Normal rate and regular rhythm.     Pulses: Normal pulses.     Heart sounds: Normal heart sounds.  Pulmonary:     Effort: Pulmonary effort is normal.      Breath sounds: Normal breath sounds.  Abdominal:     General: Abdomen is flat. Bowel sounds are normal.     Palpations: Abdomen is soft.  Musculoskeletal:     Cervical back: Normal range of motion and neck supple.     Right lower leg: Edema present.  Skin:    General: Skin is warm.     Capillary Refill: Capillary refill takes less than 2 seconds.     Comments: Right ankle healing abrasion.  RLE with redness up to mid lower leg.   Neurological:     General: No focal deficit present.     Mental Status: He is alert and oriented to person, place, and time.  Psychiatric:        Mood and Affect: Mood normal.        Behavior: Behavior normal.     ED Results / Procedures / Treatments   Labs (all labs ordered are listed, but only abnormal results are displayed) Labs Reviewed  D-DIMER, QUANTITATIVE - Abnormal; Notable for the following components:      Result Value   D-Dimer, Quant 1.84 (*)    All other components within normal limits  BASIC METABOLIC PANEL - Abnormal; Notable for the following components:   Sodium 133 (*)    Calcium 8.6 (*)    All other components within normal limits  CBC - Abnormal; Notable for the following components:   RBC 3.53 (*)    Hemoglobin 9.4 (*)    HCT 29.8 (*)    RDW 16.2 (*)    All other components within normal limits    EKG None  Radiology No results found.  Procedures Procedures    Medications Ordered in ED Medications  cephALEXin (KEFLEX) capsule 500 mg (500 mg Oral Given 09/09/22 2227)  apixaban (ELIQUIS) tablet 10 mg (10 mg Oral Given 09/09/22 2233)    ED Course/ Medical Decision Making/ A&P                                 Medical Decision Making Amount and/or Complexity of Data Reviewed Labs: ordered.  Risk Prescription drug management.   This patient presents to the ED for concern of right leg pain/swelling, this involves an extensive number of treatment options, and is a complaint that carries with it a high risk of  complications and morbidity.  The differential diagnosis includes cellulitis, dvt   Co morbidities that complicate the patient evaluation  DM, DJD, depression, cad, kidney  stones, enlarged prostate, htn, hld, and osa   Additional history obtained:  Additional history obtained from epic chart review External records from outside source obtained and reviewed including wife   Lab Tests:  I Ordered, and personally interpreted labs.  The pertinent results include:  cbc with hgb 9.4 (stable), bmp nl, ddimer + at 1.84   Medicines ordered and prescription drug management:  I ordered medication including eliquis and keflex  for sx  Reevaluation of the patient after these medicines showed that the patient improved I have reviewed the patients home medicines and have made adjustments as needed   Test Considered:  [place]   Problem List / ED Course:  Right leg pain and swelling:  I will treat pt for cellulitis with keflex.  Pt could also have a DVT.  Unfortunately, we don't have [place] tonight, so I have treated him with 1 dose of eliquis (10 mg) and he will come back tomorrow at 0800 for [place].     Reevaluation:  After the interventions noted above, I reevaluated the patient and found that they have :improved   Social Determinants of Health:  Lives at home   Dispostion:  After consideration of the diagnostic results and the patients response to treatment, I feel that the patent would benefit from discharge with outpatient f/u.          Final Clinical Impression(s) / ED Diagnoses Final diagnoses:  Cellulitis of right lower extremity  Leg swelling    Rx / DC Orders ED Discharge Orders          Ordered    cephALEXin (KEFLEX) 500 MG capsule  3 times daily        09/09/22 2237    US Venous Img Upper Uni Right        09/09/22 2244              Jacalyn Lefevre, MD 09/09/22 2249

## 2022-09-09 NOTE — Discharge Instructions (Addendum)
Return tomorrow at 0800 for your ultrasound.

## 2022-09-10 ENCOUNTER — Ambulatory Visit (HOSPITAL_BASED_OUTPATIENT_CLINIC_OR_DEPARTMENT_OTHER)
Admission: RE | Admit: 2022-09-10 | Discharge: 2022-09-10 | Disposition: A | Discharge status: Home / Self Care | Payer: BC Managed Care – PPO | Source: Ambulatory Visit | Attending: Emergency Medicine | Admitting: Emergency Medicine

## 2022-09-10 ENCOUNTER — Other Ambulatory Visit (HOSPITAL_BASED_OUTPATIENT_CLINIC_OR_DEPARTMENT_OTHER): Payer: Self-pay | Admitting: Emergency Medicine

## 2022-09-10 DIAGNOSIS — M7989 Other specified soft tissue disorders: Secondary | ICD-10-CM

## 2022-09-10 DIAGNOSIS — M79604 Pain in right leg: Secondary | ICD-10-CM | POA: Diagnosis not present

## 2022-09-10 DIAGNOSIS — L03115 Cellulitis of right lower limb: Secondary | ICD-10-CM | POA: Insufficient documentation

## 2022-09-10 DIAGNOSIS — R600 Localized edema: Secondary | ICD-10-CM | POA: Diagnosis not present

## 2022-09-10 NOTE — ED Provider Notes (Signed)
[name] returns to the emergency department for right lower extremity ultrasound.  Ultrasound was completed and is negative for DVT of the decaf and tibial veins are poorly visualized.  He does have a wound present with erythema extending from that area and feels more likely that his presentation is consistent with infection than DVT.  Given poor visualization of veins, recommend repeat ultrasound through primary care physician in approximately 1 week, but my suspicion for DVT is low enough at this time given ultrasound otherwise negative and concern for cellulitis that I feel the potential risks of anticoagulation outweigh the benefits. Recommend continuing abx prescribed. Patient discharged in stable condition with understanding of reasons to return.    Alvira Monday, MD 09/12/22 [phone]

## 2022-09-17 DIAGNOSIS — L0390 Cellulitis, unspecified: Secondary | ICD-10-CM | POA: Diagnosis not present

## 2022-09-17 DIAGNOSIS — L853 Xerosis cutis: Secondary | ICD-10-CM | POA: Diagnosis not present

## 2022-09-17 DIAGNOSIS — M25472 Effusion, left ankle: Secondary | ICD-10-CM | POA: Diagnosis not present

## 2022-09-17 DIAGNOSIS — M25471 Effusion, right ankle: Secondary | ICD-10-CM | POA: Diagnosis not present

## 2022-09-23 DIAGNOSIS — M533 Sacrococcygeal disorders, not elsewhere classified: Secondary | ICD-10-CM | POA: Diagnosis not present

## 2022-09-23 DIAGNOSIS — Z79899 Other long term (current) drug therapy: Secondary | ICD-10-CM | POA: Diagnosis not present

## 2022-09-23 DIAGNOSIS — Z79891 Long term (current) use of opiate analgesic: Secondary | ICD-10-CM | POA: Diagnosis not present

## 2022-09-23 DIAGNOSIS — Z5181 Encounter for therapeutic drug level monitoring: Secondary | ICD-10-CM | POA: Diagnosis not present

## 2022-09-23 DIAGNOSIS — M5417 Radiculopathy, lumbosacral region: Secondary | ICD-10-CM | POA: Diagnosis not present

## 2022-09-23 DIAGNOSIS — M549 Dorsalgia, unspecified: Secondary | ICD-10-CM | POA: Diagnosis not present

## 2022-10-02 DIAGNOSIS — M1612 Unilateral primary osteoarthritis, left hip: Secondary | ICD-10-CM | POA: Diagnosis not present

## 2022-11-09 DIAGNOSIS — M1612 Unilateral primary osteoarthritis, left hip: Secondary | ICD-10-CM | POA: Diagnosis not present

## 2022-11-09 DIAGNOSIS — R269 Unspecified abnormalities of gait and mobility: Secondary | ICD-10-CM | POA: Diagnosis not present

## 2022-11-09 DIAGNOSIS — I10 Essential (primary) hypertension: Secondary | ICD-10-CM | POA: Diagnosis not present

## 2022-11-09 DIAGNOSIS — M51369 Other intervertebral disc degeneration, lumbar region without mention of lumbar back pain or lower extremity pain: Secondary | ICD-10-CM | POA: Diagnosis not present

## 2022-11-09 DIAGNOSIS — W19XXXA Unspecified fall, initial encounter: Secondary | ICD-10-CM | POA: Diagnosis not present

## 2022-11-09 DIAGNOSIS — M8959 Osteolysis, multiple sites: Secondary | ICD-10-CM | POA: Diagnosis not present

## 2022-11-09 DIAGNOSIS — M25552 Pain in left hip: Secondary | ICD-10-CM | POA: Diagnosis not present

## 2022-11-09 DIAGNOSIS — R296 Repeated falls: Secondary | ICD-10-CM | POA: Diagnosis not present

## 2022-11-10 DIAGNOSIS — M25552 Pain in left hip: Secondary | ICD-10-CM | POA: Diagnosis not present

## 2022-11-11 DIAGNOSIS — M25552 Pain in left hip: Secondary | ICD-10-CM | POA: Diagnosis not present

## 2022-11-11 DIAGNOSIS — Z743 Need for continuous supervision: Secondary | ICD-10-CM | POA: Diagnosis not present

## 2022-11-11 DIAGNOSIS — R001 Bradycardia, unspecified: Secondary | ICD-10-CM | POA: Diagnosis not present

## 2022-11-12 DIAGNOSIS — R2681 Unsteadiness on feet: Secondary | ICD-10-CM | POA: Diagnosis not present

## 2022-11-12 DIAGNOSIS — W19XXXA Unspecified fall, initial encounter: Secondary | ICD-10-CM | POA: Diagnosis not present

## 2022-11-12 DIAGNOSIS — Z5181 Encounter for therapeutic drug level monitoring: Secondary | ICD-10-CM | POA: Diagnosis not present

## 2022-11-12 DIAGNOSIS — I10 Essential (primary) hypertension: Secondary | ICD-10-CM | POA: Diagnosis not present

## 2022-11-12 DIAGNOSIS — R52 Pain, unspecified: Secondary | ICD-10-CM | POA: Diagnosis not present

## 2022-11-13 DIAGNOSIS — F4320 Adjustment disorder, unspecified: Secondary | ICD-10-CM | POA: Diagnosis not present

## 2022-11-15 DIAGNOSIS — E119 Type 2 diabetes mellitus without complications: Secondary | ICD-10-CM | POA: Diagnosis not present

## 2022-11-17 DIAGNOSIS — E785 Hyperlipidemia, unspecified: Secondary | ICD-10-CM | POA: Diagnosis not present

## 2022-11-17 DIAGNOSIS — M25552 Pain in left hip: Secondary | ICD-10-CM | POA: Diagnosis not present

## 2022-11-17 DIAGNOSIS — E119 Type 2 diabetes mellitus without complications: Secondary | ICD-10-CM | POA: Diagnosis not present

## 2022-11-17 DIAGNOSIS — R2681 Unsteadiness on feet: Secondary | ICD-10-CM | POA: Diagnosis not present

## 2022-11-17 DIAGNOSIS — R300 Dysuria: Secondary | ICD-10-CM | POA: Diagnosis not present

## 2022-11-17 DIAGNOSIS — R52 Pain, unspecified: Secondary | ICD-10-CM | POA: Diagnosis not present

## 2022-11-17 DIAGNOSIS — W19XXXA Unspecified fall, initial encounter: Secondary | ICD-10-CM | POA: Diagnosis not present

## 2022-11-18 DIAGNOSIS — N390 Urinary tract infection, site not specified: Secondary | ICD-10-CM | POA: Diagnosis not present

## 2022-11-18 DIAGNOSIS — M533 Sacrococcygeal disorders, not elsewhere classified: Secondary | ICD-10-CM | POA: Diagnosis not present

## 2022-11-18 DIAGNOSIS — G894 Chronic pain syndrome: Secondary | ICD-10-CM | POA: Diagnosis not present

## 2022-11-18 DIAGNOSIS — M549 Dorsalgia, unspecified: Secondary | ICD-10-CM | POA: Diagnosis not present

## 2022-11-18 DIAGNOSIS — M1612 Unilateral primary osteoarthritis, left hip: Secondary | ICD-10-CM | POA: Diagnosis not present

## 2022-11-19 DIAGNOSIS — W19XXXA Unspecified fall, initial encounter: Secondary | ICD-10-CM | POA: Diagnosis not present

## 2022-11-19 DIAGNOSIS — R2681 Unsteadiness on feet: Secondary | ICD-10-CM | POA: Diagnosis not present

## 2022-11-19 DIAGNOSIS — R52 Pain, unspecified: Secondary | ICD-10-CM | POA: Diagnosis not present

## 2022-11-24 DIAGNOSIS — W19XXXA Unspecified fall, initial encounter: Secondary | ICD-10-CM | POA: Diagnosis not present

## 2022-11-24 DIAGNOSIS — R2681 Unsteadiness on feet: Secondary | ICD-10-CM | POA: Diagnosis not present

## 2022-11-24 DIAGNOSIS — R52 Pain, unspecified: Secondary | ICD-10-CM | POA: Diagnosis not present

## 2022-11-26 DIAGNOSIS — R52 Pain, unspecified: Secondary | ICD-10-CM | POA: Diagnosis not present

## 2022-11-26 DIAGNOSIS — R2681 Unsteadiness on feet: Secondary | ICD-10-CM | POA: Diagnosis not present

## 2022-11-26 DIAGNOSIS — W19XXXA Unspecified fall, initial encounter: Secondary | ICD-10-CM | POA: Diagnosis not present

## 2022-12-01 DIAGNOSIS — E1169 Type 2 diabetes mellitus with other specified complication: Secondary | ICD-10-CM | POA: Diagnosis not present

## 2022-12-01 DIAGNOSIS — M25552 Pain in left hip: Secondary | ICD-10-CM | POA: Diagnosis not present

## 2022-12-01 DIAGNOSIS — I10 Essential (primary) hypertension: Secondary | ICD-10-CM | POA: Diagnosis not present

## 2022-12-15 DIAGNOSIS — Z01818 Encounter for other preprocedural examination: Secondary | ICD-10-CM | POA: Diagnosis not present

## 2022-12-19 DIAGNOSIS — N99112 Postprocedural membranous urethral stricture: Secondary | ICD-10-CM | POA: Diagnosis not present

## 2022-12-26 DIAGNOSIS — N99112 Postprocedural membranous urethral stricture: Secondary | ICD-10-CM | POA: Diagnosis not present

## 2023-01-16 DIAGNOSIS — I493 Ventricular premature depolarization: Secondary | ICD-10-CM | POA: Diagnosis not present

## 2023-01-16 DIAGNOSIS — Z01810 Encounter for preprocedural cardiovascular examination: Secondary | ICD-10-CM | POA: Diagnosis not present

## 2023-01-19 DIAGNOSIS — M1612 Unilateral primary osteoarthritis, left hip: Secondary | ICD-10-CM | POA: Diagnosis not present

## 2023-01-19 DIAGNOSIS — G894 Chronic pain syndrome: Secondary | ICD-10-CM | POA: Diagnosis not present

## 2023-01-19 DIAGNOSIS — M533 Sacrococcygeal disorders, not elsewhere classified: Secondary | ICD-10-CM | POA: Diagnosis not present

## 2023-01-19 DIAGNOSIS — M549 Dorsalgia, unspecified: Secondary | ICD-10-CM | POA: Diagnosis not present

## 2023-01-20 DIAGNOSIS — N390 Urinary tract infection, site not specified: Secondary | ICD-10-CM | POA: Diagnosis not present

## 2023-01-20 DIAGNOSIS — R8290 Unspecified abnormal findings in urine: Secondary | ICD-10-CM | POA: Diagnosis not present

## 2023-01-26 DIAGNOSIS — E782 Mixed hyperlipidemia: Secondary | ICD-10-CM | POA: Diagnosis not present

## 2023-01-26 DIAGNOSIS — E559 Vitamin D deficiency, unspecified: Secondary | ICD-10-CM | POA: Diagnosis not present

## 2023-01-26 DIAGNOSIS — I10 Essential (primary) hypertension: Secondary | ICD-10-CM | POA: Diagnosis not present

## 2023-01-26 DIAGNOSIS — Z79899 Other long term (current) drug therapy: Secondary | ICD-10-CM | POA: Diagnosis not present

## 2023-01-26 DIAGNOSIS — R739 Hyperglycemia, unspecified: Secondary | ICD-10-CM | POA: Diagnosis not present

## 2023-01-26 DIAGNOSIS — Z8679 Personal history of other diseases of the circulatory system: Secondary | ICD-10-CM | POA: Diagnosis not present

## 2023-01-26 DIAGNOSIS — D649 Anemia, unspecified: Secondary | ICD-10-CM | POA: Diagnosis not present

## 2023-01-26 DIAGNOSIS — Z139 Encounter for screening, unspecified: Secondary | ICD-10-CM | POA: Diagnosis not present

## 2023-01-29 DIAGNOSIS — N390 Urinary tract infection, site not specified: Secondary | ICD-10-CM | POA: Diagnosis not present

## 2023-02-04 DIAGNOSIS — Z79899 Other long term (current) drug therapy: Secondary | ICD-10-CM | POA: Diagnosis not present

## 2023-02-04 DIAGNOSIS — M1612 Unilateral primary osteoarthritis, left hip: Secondary | ICD-10-CM | POA: Diagnosis not present

## 2023-02-05 DIAGNOSIS — M87052 Idiopathic aseptic necrosis of left femur: Secondary | ICD-10-CM | POA: Diagnosis not present

## 2023-02-05 DIAGNOSIS — M1612 Unilateral primary osteoarthritis, left hip: Secondary | ICD-10-CM | POA: Diagnosis not present

## 2023-02-05 DIAGNOSIS — Z79899 Other long term (current) drug therapy: Secondary | ICD-10-CM | POA: Diagnosis not present

## 2023-02-09 DIAGNOSIS — M25552 Pain in left hip: Secondary | ICD-10-CM | POA: Diagnosis not present

## 2023-02-09 DIAGNOSIS — R29898 Other symptoms and signs involving the musculoskeletal system: Secondary | ICD-10-CM | POA: Diagnosis not present

## 2023-02-09 DIAGNOSIS — M25652 Stiffness of left hip, not elsewhere classified: Secondary | ICD-10-CM | POA: Diagnosis not present

## 2023-02-11 DIAGNOSIS — R29898 Other symptoms and signs involving the musculoskeletal system: Secondary | ICD-10-CM | POA: Diagnosis not present

## 2023-02-11 DIAGNOSIS — M25652 Stiffness of left hip, not elsewhere classified: Secondary | ICD-10-CM | POA: Diagnosis not present

## 2023-02-11 DIAGNOSIS — M25552 Pain in left hip: Secondary | ICD-10-CM | POA: Diagnosis not present

## 2023-02-16 DIAGNOSIS — M25552 Pain in left hip: Secondary | ICD-10-CM | POA: Diagnosis not present

## 2023-02-16 DIAGNOSIS — R29898 Other symptoms and signs involving the musculoskeletal system: Secondary | ICD-10-CM | POA: Diagnosis not present

## 2023-02-16 DIAGNOSIS — M25652 Stiffness of left hip, not elsewhere classified: Secondary | ICD-10-CM | POA: Diagnosis not present

## 2023-02-18 DIAGNOSIS — Z471 Aftercare following joint replacement surgery: Secondary | ICD-10-CM | POA: Diagnosis not present

## 2023-02-18 DIAGNOSIS — Z96642 Presence of left artificial hip joint: Secondary | ICD-10-CM | POA: Diagnosis not present

## 2023-02-20 DIAGNOSIS — R29898 Other symptoms and signs involving the musculoskeletal system: Secondary | ICD-10-CM | POA: Diagnosis not present

## 2023-02-20 DIAGNOSIS — M25652 Stiffness of left hip, not elsewhere classified: Secondary | ICD-10-CM | POA: Diagnosis not present

## 2023-02-20 DIAGNOSIS — M25552 Pain in left hip: Secondary | ICD-10-CM | POA: Diagnosis not present

## 2023-02-23 DIAGNOSIS — R29898 Other symptoms and signs involving the musculoskeletal system: Secondary | ICD-10-CM | POA: Diagnosis not present

## 2023-02-23 DIAGNOSIS — M25652 Stiffness of left hip, not elsewhere classified: Secondary | ICD-10-CM | POA: Diagnosis not present

## 2023-02-23 DIAGNOSIS — M25552 Pain in left hip: Secondary | ICD-10-CM | POA: Diagnosis not present

## 2023-02-25 DIAGNOSIS — R29898 Other symptoms and signs involving the musculoskeletal system: Secondary | ICD-10-CM | POA: Diagnosis not present

## 2023-02-25 DIAGNOSIS — M25552 Pain in left hip: Secondary | ICD-10-CM | POA: Diagnosis not present

## 2023-02-25 DIAGNOSIS — M25652 Stiffness of left hip, not elsewhere classified: Secondary | ICD-10-CM | POA: Diagnosis not present

## 2023-03-02 DIAGNOSIS — R29898 Other symptoms and signs involving the musculoskeletal system: Secondary | ICD-10-CM | POA: Diagnosis not present

## 2023-03-02 DIAGNOSIS — M25552 Pain in left hip: Secondary | ICD-10-CM | POA: Diagnosis not present

## 2023-03-02 DIAGNOSIS — M25652 Stiffness of left hip, not elsewhere classified: Secondary | ICD-10-CM | POA: Diagnosis not present

## 2023-03-04 DIAGNOSIS — M25552 Pain in left hip: Secondary | ICD-10-CM | POA: Diagnosis not present

## 2023-03-04 DIAGNOSIS — R29898 Other symptoms and signs involving the musculoskeletal system: Secondary | ICD-10-CM | POA: Diagnosis not present

## 2023-03-04 DIAGNOSIS — M25652 Stiffness of left hip, not elsewhere classified: Secondary | ICD-10-CM | POA: Diagnosis not present

## 2023-03-09 DIAGNOSIS — R29898 Other symptoms and signs involving the musculoskeletal system: Secondary | ICD-10-CM | POA: Diagnosis not present

## 2023-03-09 DIAGNOSIS — M25552 Pain in left hip: Secondary | ICD-10-CM | POA: Diagnosis not present

## 2023-03-09 DIAGNOSIS — Z471 Aftercare following joint replacement surgery: Secondary | ICD-10-CM | POA: Diagnosis not present

## 2023-03-09 DIAGNOSIS — M25652 Stiffness of left hip, not elsewhere classified: Secondary | ICD-10-CM | POA: Diagnosis not present

## 2023-03-11 DIAGNOSIS — M25652 Stiffness of left hip, not elsewhere classified: Secondary | ICD-10-CM | POA: Diagnosis not present

## 2023-03-11 DIAGNOSIS — R29898 Other symptoms and signs involving the musculoskeletal system: Secondary | ICD-10-CM | POA: Diagnosis not present

## 2023-03-11 DIAGNOSIS — M25552 Pain in left hip: Secondary | ICD-10-CM | POA: Diagnosis not present

## 2023-03-16 DIAGNOSIS — M533 Sacrococcygeal disorders, not elsewhere classified: Secondary | ICD-10-CM | POA: Diagnosis not present

## 2023-03-16 DIAGNOSIS — Z79899 Other long term (current) drug therapy: Secondary | ICD-10-CM | POA: Diagnosis not present

## 2023-03-16 DIAGNOSIS — M25652 Stiffness of left hip, not elsewhere classified: Secondary | ICD-10-CM | POA: Diagnosis not present

## 2023-03-16 DIAGNOSIS — R29898 Other symptoms and signs involving the musculoskeletal system: Secondary | ICD-10-CM | POA: Diagnosis not present

## 2023-03-16 DIAGNOSIS — M1612 Unilateral primary osteoarthritis, left hip: Secondary | ICD-10-CM | POA: Diagnosis not present

## 2023-03-16 DIAGNOSIS — M25552 Pain in left hip: Secondary | ICD-10-CM | POA: Diagnosis not present

## 2023-03-16 DIAGNOSIS — G894 Chronic pain syndrome: Secondary | ICD-10-CM | POA: Diagnosis not present

## 2023-03-16 DIAGNOSIS — M549 Dorsalgia, unspecified: Secondary | ICD-10-CM | POA: Diagnosis not present

## 2023-03-16 DIAGNOSIS — Z5181 Encounter for therapeutic drug level monitoring: Secondary | ICD-10-CM | POA: Diagnosis not present

## 2023-03-18 DIAGNOSIS — M25652 Stiffness of left hip, not elsewhere classified: Secondary | ICD-10-CM | POA: Diagnosis not present

## 2023-03-18 DIAGNOSIS — M25552 Pain in left hip: Secondary | ICD-10-CM | POA: Diagnosis not present

## 2023-03-18 DIAGNOSIS — R29898 Other symptoms and signs involving the musculoskeletal system: Secondary | ICD-10-CM | POA: Diagnosis not present

## 2023-03-19 DIAGNOSIS — Z96642 Presence of left artificial hip joint: Secondary | ICD-10-CM | POA: Diagnosis not present

## 2023-03-19 DIAGNOSIS — Z471 Aftercare following joint replacement surgery: Secondary | ICD-10-CM | POA: Diagnosis not present

## 2023-03-23 DIAGNOSIS — M25652 Stiffness of left hip, not elsewhere classified: Secondary | ICD-10-CM | POA: Diagnosis not present

## 2023-03-23 DIAGNOSIS — M25552 Pain in left hip: Secondary | ICD-10-CM | POA: Diagnosis not present

## 2023-03-23 DIAGNOSIS — R29898 Other symptoms and signs involving the musculoskeletal system: Secondary | ICD-10-CM | POA: Diagnosis not present

## 2023-03-25 DIAGNOSIS — M25652 Stiffness of left hip, not elsewhere classified: Secondary | ICD-10-CM | POA: Diagnosis not present

## 2023-03-25 DIAGNOSIS — R29898 Other symptoms and signs involving the musculoskeletal system: Secondary | ICD-10-CM | POA: Diagnosis not present

## 2023-03-25 DIAGNOSIS — M25552 Pain in left hip: Secondary | ICD-10-CM | POA: Diagnosis not present

## 2023-03-26 DIAGNOSIS — R350 Frequency of micturition: Secondary | ICD-10-CM | POA: Diagnosis not present

## 2023-03-26 DIAGNOSIS — M1612 Unilateral primary osteoarthritis, left hip: Secondary | ICD-10-CM | POA: Diagnosis not present

## 2023-03-30 DIAGNOSIS — R29898 Other symptoms and signs involving the musculoskeletal system: Secondary | ICD-10-CM | POA: Diagnosis not present

## 2023-03-30 DIAGNOSIS — M25652 Stiffness of left hip, not elsewhere classified: Secondary | ICD-10-CM | POA: Diagnosis not present

## 2023-03-30 DIAGNOSIS — M25552 Pain in left hip: Secondary | ICD-10-CM | POA: Diagnosis not present

## 2023-04-01 DIAGNOSIS — M25652 Stiffness of left hip, not elsewhere classified: Secondary | ICD-10-CM | POA: Diagnosis not present

## 2023-04-01 DIAGNOSIS — M25552 Pain in left hip: Secondary | ICD-10-CM | POA: Diagnosis not present

## 2023-04-01 DIAGNOSIS — R29898 Other symptoms and signs involving the musculoskeletal system: Secondary | ICD-10-CM | POA: Diagnosis not present

## 2023-04-06 ENCOUNTER — Other Ambulatory Visit: Payer: Self-pay

## 2023-04-06 ENCOUNTER — Emergency Department (HOSPITAL_BASED_OUTPATIENT_CLINIC_OR_DEPARTMENT_OTHER)
Admission: EM | Admit: 2023-04-06 | Discharge: 2023-04-06 | Disposition: A | Discharge status: Home / Self Care | Attending: Emergency Medicine | Admitting: Emergency Medicine

## 2023-04-06 ENCOUNTER — Encounter (HOSPITAL_BASED_OUTPATIENT_CLINIC_OR_DEPARTMENT_OTHER): Payer: Self-pay | Admitting: Emergency Medicine

## 2023-04-06 DIAGNOSIS — R112 Nausea with vomiting, unspecified: Secondary | ICD-10-CM | POA: Insufficient documentation

## 2023-04-06 DIAGNOSIS — R7309 Other abnormal glucose: Secondary | ICD-10-CM | POA: Diagnosis not present

## 2023-04-06 DIAGNOSIS — I10 Essential (primary) hypertension: Secondary | ICD-10-CM | POA: Diagnosis not present

## 2023-04-06 LAB — CBC
HCT: 35.7 % — ABNORMAL LOW (ref 39.0–52.0)
Hemoglobin: 11.3 g/dL — ABNORMAL LOW (ref 13.0–17.0)
MCH: 27.8 pg (ref 26.0–34.0)
MCHC: 31.7 g/dL (ref 30.0–36.0)
MCV: 87.7 fL (ref 80.0–100.0)
Platelets: 141 10*3/uL — ABNORMAL LOW (ref 150–400)
RBC: 4.07 MIL/uL — ABNORMAL LOW (ref 4.22–5.81)
RDW: 16.4 % — ABNORMAL HIGH (ref 11.5–15.5)
WBC: 6.3 10*3/uL (ref 4.0–10.5)
nRBC: 0 % (ref 0.0–0.2)

## 2023-04-06 LAB — URINALYSIS, ROUTINE W REFLEX MICROSCOPIC
Bilirubin Urine: NEGATIVE
Glucose, UA: NEGATIVE mg/dL
Hgb urine dipstick: NEGATIVE
Ketones, ur: NEGATIVE mg/dL
Leukocytes,Ua: NEGATIVE
Nitrite: NEGATIVE
Protein, ur: NEGATIVE mg/dL
Specific Gravity, Urine: 1.02 (ref 1.005–1.030)
pH: 5.5 (ref 5.0–8.0)

## 2023-04-06 LAB — COMPREHENSIVE METABOLIC PANEL WITH GFR
ALT: 23 U/L (ref 0–44)
AST: 33 U/L (ref 15–41)
Albumin: 3.6 g/dL (ref 3.5–5.0)
Alkaline Phosphatase: 51 U/L (ref 38–126)
Anion gap: 10 (ref 5–15)
BUN: 16 mg/dL (ref 8–23)
CO2: 23 mmol/L (ref 22–32)
Calcium: 8.8 mg/dL — ABNORMAL LOW (ref 8.9–10.3)
Chloride: 106 mmol/L (ref 98–111)
Creatinine, Ser: 0.8 mg/dL (ref 0.61–1.24)
GFR, Estimated: >60 mL/min (ref >60)
Glucose, Bld: 102 mg/dL — ABNORMAL HIGH (ref 70–99)
Potassium: 3.9 mmol/L (ref 3.5–5.1)
Sodium: 139 mmol/L (ref 135–145)
Total Bilirubin: 1.3 mg/dL — ABNORMAL HIGH (ref 0.0–1.2)
Total Protein: 6.7 g/dL (ref 6.5–8.1)

## 2023-04-06 LAB — RESP PANEL BY RT-PCR (RSV, FLU A&B, COVID)  RVPGX2
Influenza A by PCR: NEGATIVE
Influenza B by PCR: NEGATIVE
Resp Syncytial Virus by PCR: NEGATIVE
SARS Coronavirus 2 by RT PCR: NEGATIVE

## 2023-04-06 LAB — LIPASE, BLOOD: Lipase: 22 U/L (ref 11–51)

## 2023-04-06 MED ORDER — ONDANSETRON HCL 4 MG PO TABS: 4.0000 mg | ORAL_TABLET | Freq: Three times a day (TID) | ORAL | 0 refills | Status: AC | PRN

## 2023-04-06 MED ORDER — TRIMETHOBENZAMIDE HCL 100 MG/ML IM SOLN: 200.0000 mg | INTRAMUSCULAR | Status: DC

## 2023-04-06 MED ORDER — SODIUM CHLORIDE 0.9 % IV BOLUS
1000.0000 mL | Freq: Once | INTRAVENOUS | Status: AC
  Administered 2023-04-06: 1000 mL via INTRAVENOUS

## 2023-04-06 MED ORDER — ONDANSETRON HCL 4 MG/2ML IJ SOLN: 4.0000 mg | Freq: Once | INTRAMUSCULAR | Status: DC

## 2023-04-06 NOTE — ED Provider Notes (Signed)
 [place] EMERGENCY DEPARTMENT AT [facility] Provider Note   CSN: [id number] Arrival date & time: 04/06/23  1040     History  Chief Complaint  Patient presents with   Emesis    [name] is a 68 y.o. male with medical history significant for hypertension, hyperlipidemia, depression, DDD, chronic back pain, QTc prolongation, paroxysmal A-fib.  Patient presents to ED for evaluation of nausea and vomiting.  Reports that this morning around 4 AM woke up with nausea and has vomited 4 times as a result.  Reports last time he threw up was around 7 AM.  He denies any diarrhea, abdominal pain.  He was recently admitted and discharged in the hospital secondary to UTI but denies any dysuria.  Reports that this was about 1 week ago.  Denies any pain in his flanks.  Reports that he did have a fever this morning up to 102.5 Fahrenheit orally.  Denies sore throat, body aches or chills, cough.  Denies sick contacts.   Emesis Associated symptoms: no abdominal pain and no diarrhea        Home Medications Prior to Admission medications   Medication Sig Start Date End Date Taking? Authorizing Provider  ondansetron (ZOFRAN) 4 MG tablet Take 1 tablet (4 mg total) by mouth every 8 (eight) hours as needed for nausea or vomiting. 04/06/23  Yes Al Decant, PA-C  aspirin EC 81 MG tablet Take 81 mg by mouth daily. Swallow whole.    [provider]  atorvastatin (LIPITOR) 40 MG tablet Take 40 mg by mouth at bedtime.    [provider]  buprenorphine Lavera Guise) 20 MCG/HR PTWK Place 1 patch onto the skin every Monday.    [provider]  Buprenorphine HCl (BELBUCA) 300 MCG FILM Place 300 mcg inside cheek every 12 (twelve) hours.    [provider]  cephALEXin (KEFLEX) 500 MG capsule Take 1 capsule (500 mg total) by mouth 3 (three) times daily. 09/09/22   Jacalyn Lefevre, MD  etodolac (LODINE) 400 MG tablet Take 400 mg by mouth 2 (two) times daily as needed  for mild pain. 06/02/22   [provider]  Ferrous Sulfate (IRON PO) Take 1 tablet by mouth daily.    [provider]  fluticasone (FLONASE) 50 MCG/ACT nasal spray Place 1 spray into both nostrils as needed for allergies.    [provider]  metoprolol tartrate (LOPRESSOR) 25 MG tablet Take 12.5 mg by mouth 2 (two) times daily.    [provider]  omeprazole (PRILOSEC) 40 MG capsule Take 40 mg by mouth daily before breakfast.    [provider]  oxybutynin (DITROPAN-XL) 10 MG 24 hr tablet Take 10 mg by mouth in the morning.    [provider]  oxyCODONE-acetaminophen (PERCOCET/ROXICET) 5-325 MG tablet Take 1 tablet by mouth as needed (for pain).    [provider]  pregabalin (LYRICA) 75 MG capsule Take 75 mg by mouth in the morning, at noon, and at bedtime.    [provider]  silver sulfADIAZINE (SILVADENE) 1 % cream Apply 1 Application topically daily as needed (for wound care).    [provider]  triamterene-hydrochlorothiazide (MAXZIDE) 75-50 MG per tablet Take 1 tablet by mouth daily.    [provider]  VASCEPA 1 g capsule Take 2 g by mouth in the morning and at bedtime.    [provider]  Vitamin D, Ergocalciferol, (DRISDOL) 50000 UNITS CAPS capsule Take 50,000 Units by mouth every Sunday.  [provider]  XIGDUO XR 05-998 MG TB24 Take 2 tablets by mouth in the morning.    [provider]      Allergies    Patient has no known allergies.    Review of Systems   Review of Systems  Gastrointestinal:  Positive for nausea and vomiting. Negative for abdominal pain and diarrhea.  All other systems reviewed and are negative.   Physical Exam Updated Vital Signs BP (!) 108/55 (BP Location: Left Arm)   Pulse 95   Temp 98.9 F (37.2 C) (Oral)   Resp 18   Ht 5\' 10"  (1.778 m)   Wt 134 kg   SpO2 98%   BMI 42.39 kg/m  Physical Exam Vitals and nursing note reviewed.   Constitutional:      General: He is not in acute distress.    Appearance: He is well-developed.  HENT:     Head: Normocephalic and atraumatic.  Eyes:     Conjunctiva/sclera: Conjunctivae normal.  Cardiovascular:     Rate and Rhythm: Normal rate and regular rhythm.     Heart sounds: No murmur heard. Pulmonary:     Effort: Pulmonary effort is normal. No respiratory distress.     Breath sounds: Normal breath sounds.  Abdominal:     Palpations: Abdomen is soft.     Tenderness: There is no abdominal tenderness.  Musculoskeletal:        General: No swelling.     Cervical back: Neck supple.  Skin:    General: Skin is warm and dry.     Capillary Refill: Capillary refill takes less than 2 seconds.  Neurological:     Mental Status: He is alert.  Psychiatric:        Mood and Affect: Mood normal.     ED Results / Procedures / Treatments   Labs (all labs ordered are listed, but only abnormal results are displayed) Labs Reviewed  CBC - Abnormal; Notable for the following components:      Result Value   RBC 4.07 (*)    Hemoglobin 11.3 (*)    HCT 35.7 (*)    RDW 16.4 (*)    Platelets 141 (*)    All other components within normal limits  COMPREHENSIVE METABOLIC PANEL WITH GFR - Abnormal; Notable for the following components:   Glucose, Bld 102 (*)    Calcium 8.8 (*)    Total Bilirubin 1.3 (*)    All other components within normal limits  RESP PANEL BY RT-PCR (RSV, FLU A&B, COVID)  RVPGX2  LIPASE, BLOOD  URINALYSIS, ROUTINE W REFLEX MICROSCOPIC    EKG EKG Interpretation Date/Time:  Monday April 06 2023 11:50:17 EDT Ventricular Rate:  86 PR Interval:    QRS Duration:  104 QT Interval:  420 QTC Calculation: 503 R Axis:   86  Text Interpretation: Sinus rhythm Premature ventricular complexes Borderline right axis deviation Nonspecific T abnormalities, lateral leads Prolonged QT interval , increased since last tracing Confirmed by Linwood Dibbles [phone]) on 04/06/2023 11:58:05  AM  Radiology No results found.  Procedures Procedures   Medications Ordered in ED Medications  sodium chloride 0.9 % bolus 1,000 mL (1,000 mLs Intravenous New Bag/Given 04/06/23 1203)    ED Course/ Medical Decision Making/ A&P  Medical Decision Making Amount and/or Complexity of Data Reviewed Labs: ordered.  Risk Prescription drug management.   68 year old male presents for evaluation.  Please see HPI for further details.  On examination patient is afebrile and nontachycardic.  His lung  sounds are clear bilaterally, he is not hypoxic.  Abdomen is soft and compressible throughout.  Neurological examinations at baseline.  Will collect CBC, CMP, lipase, urinalysis and viral panel.  Will provide patient with 1 L of fluid.  Patient reports no abdominal pains no indication for imaging at this time.  EKG shows prolonged QTc.  Plan was to give patient a nausea medication but the patient denies nausea at this time.  Patient CBC with leukocytosis anemia.  Metabolic panel without electrolyte derangement, no elevated LFTs, anion gap 10.  Urinalysis negative for all.  Viral panel negative for all.   Patient states after 1 L of fluid he feels much better.  Will discharge patient home at this time.  Suspect possible GI viral illness as a cause of his symptoms.  Will send him home with a very small amount of Zofran medication and advised him to only take this every 8 hours for extreme nausea.  He will follow-up with his PCP.  He was given return precautions and he voiced understanding.  Stable to discharge.   Final Clinical Impression(s) / ED Diagnoses Final diagnoses:  Nausea and vomiting, unspecified vomiting type    Rx / DC Orders ED Discharge Orders          Ordered    ondansetron (ZOFRAN) 4 MG tablet  Every 8 hours PRN        04/06/23 1425              Al Decant, [place] 04/06/23 1426    Linwood Dibbles, MD 04/06/23 1712

## 2023-04-06 NOTE — Discharge Instructions (Addendum)
 It was a pleasure taking part in your care.  As we discussed, your Coppi is reassuring.  Please follow-up with your PCP this week for further care.  Please take Zofran 4 mg every 8 hours as needed for nausea and vomiting.

## 2023-04-06 NOTE — ED Triage Notes (Signed)
 States multiple episodes of n/v since about 0300. C/o fever and decreased urine output starting this morning. Denies ABD pain

## 2023-04-10 DIAGNOSIS — M25552 Pain in left hip: Secondary | ICD-10-CM | POA: Diagnosis not present

## 2023-04-10 DIAGNOSIS — R29898 Other symptoms and signs involving the musculoskeletal system: Secondary | ICD-10-CM | POA: Diagnosis not present

## 2023-04-10 DIAGNOSIS — Z471 Aftercare following joint replacement surgery: Secondary | ICD-10-CM | POA: Diagnosis not present

## 2023-04-10 DIAGNOSIS — M25652 Stiffness of left hip, not elsewhere classified: Secondary | ICD-10-CM | POA: Diagnosis not present

## 2023-04-13 DIAGNOSIS — M25652 Stiffness of left hip, not elsewhere classified: Secondary | ICD-10-CM | POA: Diagnosis not present

## 2023-04-13 DIAGNOSIS — R29898 Other symptoms and signs involving the musculoskeletal system: Secondary | ICD-10-CM | POA: Diagnosis not present

## 2023-04-13 DIAGNOSIS — M25552 Pain in left hip: Secondary | ICD-10-CM | POA: Diagnosis not present

## 2023-04-15 DIAGNOSIS — M25552 Pain in left hip: Secondary | ICD-10-CM | POA: Diagnosis not present

## 2023-04-15 DIAGNOSIS — M25652 Stiffness of left hip, not elsewhere classified: Secondary | ICD-10-CM | POA: Diagnosis not present

## 2023-04-15 DIAGNOSIS — R29898 Other symptoms and signs involving the musculoskeletal system: Secondary | ICD-10-CM | POA: Diagnosis not present

## 2023-04-17 DIAGNOSIS — I350 Nonrheumatic aortic (valve) stenosis: Secondary | ICD-10-CM | POA: Diagnosis not present

## 2023-04-17 DIAGNOSIS — I10 Essential (primary) hypertension: Secondary | ICD-10-CM | POA: Diagnosis not present

## 2023-04-17 DIAGNOSIS — G4733 Obstructive sleep apnea (adult) (pediatric): Secondary | ICD-10-CM | POA: Diagnosis not present

## 2023-04-17 DIAGNOSIS — Z6836 Body mass index (BMI) 36.0-36.9, adult: Secondary | ICD-10-CM | POA: Diagnosis not present

## 2023-04-17 DIAGNOSIS — G894 Chronic pain syndrome: Secondary | ICD-10-CM | POA: Diagnosis not present

## 2023-04-20 DIAGNOSIS — R29898 Other symptoms and signs involving the musculoskeletal system: Secondary | ICD-10-CM | POA: Diagnosis not present

## 2023-04-20 DIAGNOSIS — M25552 Pain in left hip: Secondary | ICD-10-CM | POA: Diagnosis not present

## 2023-04-20 DIAGNOSIS — M25652 Stiffness of left hip, not elsewhere classified: Secondary | ICD-10-CM | POA: Diagnosis not present

## 2023-04-22 DIAGNOSIS — R29898 Other symptoms and signs involving the musculoskeletal system: Secondary | ICD-10-CM | POA: Diagnosis not present

## 2023-04-22 DIAGNOSIS — M25652 Stiffness of left hip, not elsewhere classified: Secondary | ICD-10-CM | POA: Diagnosis not present

## 2023-04-22 DIAGNOSIS — M25552 Pain in left hip: Secondary | ICD-10-CM | POA: Diagnosis not present

## 2023-04-27 DIAGNOSIS — R29898 Other symptoms and signs involving the musculoskeletal system: Secondary | ICD-10-CM | POA: Diagnosis not present

## 2023-04-27 DIAGNOSIS — M25652 Stiffness of left hip, not elsewhere classified: Secondary | ICD-10-CM | POA: Diagnosis not present

## 2023-04-27 DIAGNOSIS — M25552 Pain in left hip: Secondary | ICD-10-CM | POA: Diagnosis not present

## 2023-04-30 ENCOUNTER — Other Ambulatory Visit: Payer: Self-pay

## 2023-04-30 ENCOUNTER — Emergency Department (HOSPITAL_BASED_OUTPATIENT_CLINIC_OR_DEPARTMENT_OTHER)
Admission: EM | Admit: 2023-04-30 | Discharge: 2023-04-30 | Disposition: A | Discharge status: Home / Self Care | Attending: Emergency Medicine | Admitting: Emergency Medicine

## 2023-04-30 ENCOUNTER — Encounter (HOSPITAL_BASED_OUTPATIENT_CLINIC_OR_DEPARTMENT_OTHER): Payer: Self-pay

## 2023-04-30 ENCOUNTER — Emergency Department (HOSPITAL_BASED_OUTPATIENT_CLINIC_OR_DEPARTMENT_OTHER)

## 2023-04-30 DIAGNOSIS — J189 Pneumonia, unspecified organism: Secondary | ICD-10-CM | POA: Insufficient documentation

## 2023-04-30 DIAGNOSIS — Z79899 Other long term (current) drug therapy: Secondary | ICD-10-CM | POA: Insufficient documentation

## 2023-04-30 DIAGNOSIS — Z8546 Personal history of malignant neoplasm of prostate: Secondary | ICD-10-CM | POA: Insufficient documentation

## 2023-04-30 DIAGNOSIS — E119 Type 2 diabetes mellitus without complications: Secondary | ICD-10-CM | POA: Diagnosis not present

## 2023-04-30 DIAGNOSIS — I2510 Atherosclerotic heart disease of native coronary artery without angina pectoris: Secondary | ICD-10-CM | POA: Insufficient documentation

## 2023-04-30 DIAGNOSIS — I10 Essential (primary) hypertension: Secondary | ICD-10-CM | POA: Diagnosis not present

## 2023-04-30 DIAGNOSIS — R059 Cough, unspecified: Secondary | ICD-10-CM | POA: Diagnosis not present

## 2023-04-30 DIAGNOSIS — R509 Fever, unspecified: Secondary | ICD-10-CM | POA: Diagnosis not present

## 2023-04-30 DIAGNOSIS — Z7982 Long term (current) use of aspirin: Secondary | ICD-10-CM | POA: Diagnosis not present

## 2023-04-30 DIAGNOSIS — I4891 Unspecified atrial fibrillation: Secondary | ICD-10-CM | POA: Diagnosis not present

## 2023-04-30 DIAGNOSIS — R0989 Other specified symptoms and signs involving the circulatory and respiratory systems: Secondary | ICD-10-CM | POA: Diagnosis not present

## 2023-04-30 LAB — RESP PANEL BY RT-PCR (RSV, FLU A&B, COVID)  RVPGX2
Influenza A by PCR: NEGATIVE
Influenza B by PCR: NEGATIVE
Resp Syncytial Virus by PCR: NEGATIVE
SARS Coronavirus 2 by RT PCR: NEGATIVE

## 2023-04-30 MED ORDER — AMOXICILLIN-POT CLAVULANATE 875-125 MG PO TABS
1.0000 | ORAL_TABLET | Freq: Two times a day (BID) | ORAL | Status: DC
  Administered 2023-04-30: 1 via ORAL
  Filled 2023-04-30: qty 1

## 2023-04-30 MED ORDER — DOXYCYCLINE HYCLATE 100 MG PO CAPS: 100.0000 mg | ORAL_CAPSULE | Freq: Two times a day (BID) | ORAL | 0 refills | Status: AC

## 2023-04-30 MED ORDER — DEXTROMETHORPHAN POLISTIREX ER 30 MG/5ML PO SUER
15.0000 mg | Freq: Every evening | ORAL | Status: DC | PRN
  Filled 2023-04-30: qty 5

## 2023-04-30 MED ORDER — DOXYCYCLINE HYCLATE 100 MG PO TABS
100.0000 mg | ORAL_TABLET | Freq: Two times a day (BID) | ORAL | Status: DC
  Administered 2023-04-30: 100 mg via ORAL
  Filled 2023-04-30: qty 1

## 2023-04-30 MED ORDER — ACETAMINOPHEN 325 MG PO TABS
650.0000 mg | ORAL_TABLET | Freq: Once | ORAL | Status: AC | PRN
  Administered 2023-04-30: 650 mg via ORAL
  Filled 2023-04-30: qty 2

## 2023-04-30 MED ORDER — AMOXICILLIN-POT CLAVULANATE 875-125 MG PO TABS: 1.0000 | ORAL_TABLET | Freq: Two times a day (BID) | ORAL | 0 refills | Status: AC

## 2023-04-30 MED ORDER — GUAIFENESIN-DM 100-10 MG/5ML PO SYRP
15.0000 mL | ORAL_SOLUTION | Freq: Once | ORAL | Status: AC
Start: 2023-04-30 — End: 2023-04-30
  Administered 2023-04-30: 15 mL via ORAL
  Filled 2023-04-30: qty 15

## 2023-04-30 MED ORDER — DEXTROMETHORPHAN POLISTIREX ER 30 MG/5ML PO SUER: 30.0000 mg | Freq: Once | ORAL | Status: DC

## 2023-04-30 NOTE — Discharge Instructions (Addendum)
 We checked a chest xray and viral panel. Both looked OK! Given your age and fevers, it may be reasonable to start you on antibiotics for the possibility of progressing pneumonia.   You can take Delsym  for cough as needed over the counter  Keep taking tylenol  as well   I will order Augmentin  to take every twelve hours and doxy every 12 hours until you run out of both. DO NOT stop the medications until you complete them.   Return if you have worsening shortness of breath or are not improving

## 2023-04-30 NOTE — ED Provider Notes (Signed)
 [facility] Provider Note   CSN: [id number] Arrival date & time: 04/30/23  0227     History  Chief Complaint  Patient presents with   Cough    [name] is a 68 y.o. male with PMHx  CAD, HTN, a fib, aortic stenosis, OSA, gastric sleeve, morbid obesity, diabetes, prostate cancer w/ radiation in 2022. The patient presents with a fever and cough that started on Monday. They initially thought it was cured, but the symptoms have persisted. The cough has been particularly bothersome, and they also report a sore throat. The fever has been fluctuating between 101 and 103.9 degrees Fahrenheit, which they have been managing with Tylenol . They have also been taking Mucinex  for the cough, which they report has helped to break up the congestion. They have a history of seasonal allergies, which typically flare up around this time of year and in the fall, but they note that the symptoms have never been this severe before. They deny any history of smoking or lung disease. They also report that they have been drinking fluids, but probably not enough. They have not experienced any nausea, vomiting, or chest pain.   Home Medications Prior to Admission medications   Medication Sig Start Date End Date Taking? Authorizing Provider  aspirin  EC 81 MG tablet Take 81 mg by mouth daily. Swallow whole.    [provider]  atorvastatin  (LIPITOR) 40 MG tablet Take 40 mg by mouth at bedtime.    [provider]  buprenorphine  (BUTRANS ) 20 MCG/HR PTWK Place 1 patch onto the skin every Monday.    [provider]  Buprenorphine  HCl (BELBUCA ) 300 MCG FILM Place 300 mcg inside cheek every 12 (twelve) hours.    [provider]  cephALEXin  (KEFLEX ) 500 MG capsule Take 1 capsule (500 mg total) by mouth 3 (three) times daily. 09/09/22   Sueellen Emery, MD  etodolac  (LODINE ) 400 MG tablet Take 400 mg by mouth 2 (two) times daily as needed for mild  pain. 06/02/22   [provider]  Ferrous Sulfate (IRON PO) Take 1 tablet by mouth daily.    [provider]  fluticasone  (FLONASE ) 50 MCG/ACT nasal spray Place 1 spray into both nostrils as needed for allergies.    [provider]  metoprolol tartrate (LOPRESSOR) 25 MG tablet Take 12.5 mg by mouth 2 (two) times daily.    [provider]  omeprazole (PRILOSEC) 40 MG capsule Take 40 mg by mouth daily before breakfast.    [provider]  ondansetron  (ZOFRAN ) 4 MG tablet Take 1 tablet (4 mg total) by mouth every 8 (eight) hours as needed for nausea or vomiting. 04/06/23   Adel Aden, PA-C  oxybutynin  (DITROPAN -XL) 10 MG 24 hr tablet Take 10 mg by mouth in the morning.    [provider]  oxyCODONE -acetaminophen  (PERCOCET/ROXICET) 5-325 MG tablet Take 1 tablet by mouth as needed (for pain).    [provider]  pregabalin  (LYRICA ) 75 MG capsule Take 75 mg by mouth in the morning, at noon, and at bedtime.    [provider]  silver sulfADIAZINE (SILVADENE) 1 % cream Apply 1 Application topically daily as needed (for wound care).    [provider]  triamterene -hydrochlorothiazide  (MAXZIDE ) 75-50 MG per tablet Take 1 tablet by mouth daily.    [provider]  VASCEPA  1 g capsule Take 2 g by mouth in the morning and at bedtime.    [provider]  Vitamin D , Ergocalciferol , (DRISDOL ) 50000 UNITS CAPS capsule Take 50,000 Units by mouth every Sunday.    [provider]  XIGDUO XR 05-998 MG TB24 Take 2 tablets by mouth in the morning.    [provider]      Allergies    Patient has no known allergies.    Review of Systems   Review of Systems  Constitutional:  Positive for fever.  HENT:  Positive for sore throat (2/2 cough). Negative for ear pain.   Eyes:  Negative for pain and visual disturbance.  Respiratory:  Positive for cough. Negative for shortness of breath.    Cardiovascular:  Negative for chest pain and palpitations.  Gastrointestinal:  Negative for abdominal pain and vomiting.  Genitourinary:  Negative for dysuria and hematuria.  Musculoskeletal:  Negative for arthralgias and back pain.  Skin:  Negative for color change and rash.  Neurological:  Negative for seizures and syncope.  All other systems reviewed and are negative.   Physical Exam Updated Vital Signs BP 123/73 (BP Location: Right Arm)   Pulse 93   Temp 98.7 F (37.1 C) (Oral)   Resp 18   SpO2 98%  Physical Exam Vitals and nursing note reviewed.  Constitutional:      General: He is not in acute distress.    Appearance: He is well-developed.  HENT:     Head: Normocephalic and atraumatic.     Nose: Congestion present.     Mouth/Throat:     Mouth: Mucous membranes are moist.  Eyes:     Conjunctiva/sclera: Conjunctivae normal.  Cardiovascular:     Rate and Rhythm: Normal rate and regular rhythm.     Heart sounds: No murmur heard. Pulmonary:     Effort: Pulmonary effort is normal. No respiratory distress.     Breath sounds: Normal breath sounds.  Abdominal:     Palpations: Abdomen is soft.  Musculoskeletal:        General: Normal range of motion.     Cervical back: Neck supple.  Skin:    General: Skin is warm and dry.     Capillary Refill: Capillary refill takes less than 2 seconds.  Neurological:     Mental Status: He is alert.  Psychiatric:        Mood and Affect: Mood normal.     ED Results / Procedures / Treatments   Labs (all labs ordered are listed, but only abnormal results are displayed) Labs Reviewed  RESP PANEL BY RT-PCR (RSV, FLU A&B, COVID)  [id number]    EKG None  Radiology DG Chest 1 View Result Date: 04/30/2023 CLINICAL DATA:  Cough and congestion with fever for 2 days EXAM: CHEST  1 VIEW COMPARISON:  12/15/2022 FINDINGS: Normal heart size and mediastinal contours. Dorsal column stimulator leads and transcatheter aortic valve replacement.  Chronic interstitial coarsening. There is no edema, consolidation, effusion, or pneumothorax. IMPRESSION: No convincing pneumonia when compared to prior. Electronically Signed   By: Ronnette Coke M.D.   On: 04/30/2023 04:52    Procedures Procedures    Medications Ordered in ED Medications  acetaminophen  (TYLENOL ) tablet 650 mg (has no administration in time range)  guaiFENesin -dextromethorphan  (ROBITUSSIN DM) 100-10 MG/5ML syrup 15 mL (has no administration in time range)  amoxicillin -clavulanate (AUGMENTIN ) 875-125 MG per tablet 1 tablet (has no administration in time range)  doxycycline  (VIBRA -TABS) tablet 100 mg (has no administration in time range)    ED Course/ Medical Decision Making/ A&P  Medical Decision Making Risk OTC drugs.   Medical Decision Making:   Mana Morison is a 68 y.o. male who presented to the ED today with cough and fever with Tmax 103 detailed above.    Additional history discussed with patient's family/caregivers.  Complete initial physical exam performed, notably the patient was well appearing and nontoxic with no increased WOB.    Reviewed and confirmed nursing documentation for past medical history, family history, social history.    Initial Assessment:   With the patient's presentation of cough with fever, most likely diagnosis is viral etiology of constellation of symptoms. Other diagnoses were considered including (but not limited to) PTX, PNA, allergies, GERD. These are considered less likely due to history of present illness and physical exam findings.   This is most consistent with an acute complicated illness  Initial Plan:   CXR to evaluate for structural/infectious intrathoracic pathology.  Respiratory panel  Objective evaluation as below reviewed   Initial Study Results:   Laboratory  All laboratory results reviewed without evidence of clinically relevant pathology.   - RVP negative   Radiology:  All  images reviewed independently. Agree with radiology report at this time.   DG Chest 1 View Result Date: 04/30/2023 CLINICAL DATA:  Cough and congestion with fever for 2 days EXAM: CHEST  1 VIEW COMPARISON:  12/15/2022 FINDINGS: Normal heart size and mediastinal contours. Dorsal column stimulator leads and transcatheter aortic valve replacement. Chronic interstitial coarsening. There is no edema, consolidation, effusion, or pneumothorax. IMPRESSION: No convincing pneumonia when compared to prior. Electronically Signed   By: Ronnette Coke M.D.   On: 04/30/2023 04:52    Final Assessment and Plan:    Cough  Fever: Given patient age and fever at home, with vascular congestion versus progressing infiltrate on CXR, will continue with abx treatment for CAP coverage. Has history of QT prolongation, doxy and augmentin  sent home with patient. Strict return precautions given, stay hydrated, tylenol  for symptomatic relief. Reassured by lack of underlying lung disease, nonsmoker, and no oxygen need in ED. No indication for admission, will treat outpatient for uncomplicated CAP. Shared decision-making, patient politely declined labwork at this time given that he has been able to hydrate well.    Clinical Impression:  1. Community acquired pneumonia, unspecified laterality      Data Unavailable          Final Clinical Impression(s) / ED Diagnoses Final diagnoses:  Community acquired pneumonia, unspecified laterality    Rx / DC Orders ED Discharge Orders     None         Ernestina Headland, MD 04/30/23 [id number]    Scarlette Currier, MD 04/30/23 [phone]

## 2023-04-30 NOTE — ED Triage Notes (Signed)
 Pt has had cough, congestion and fever x 2 days.

## 2023-05-06 DIAGNOSIS — M25652 Stiffness of left hip, not elsewhere classified: Secondary | ICD-10-CM | POA: Diagnosis not present

## 2023-05-06 DIAGNOSIS — R29898 Other symptoms and signs involving the musculoskeletal system: Secondary | ICD-10-CM | POA: Diagnosis not present

## 2023-05-06 DIAGNOSIS — M25552 Pain in left hip: Secondary | ICD-10-CM | POA: Diagnosis not present

## 2023-05-11 DIAGNOSIS — G894 Chronic pain syndrome: Secondary | ICD-10-CM | POA: Diagnosis not present

## 2023-05-11 DIAGNOSIS — M549 Dorsalgia, unspecified: Secondary | ICD-10-CM | POA: Diagnosis not present

## 2023-05-11 DIAGNOSIS — M1612 Unilateral primary osteoarthritis, left hip: Secondary | ICD-10-CM | POA: Diagnosis not present

## 2023-05-11 DIAGNOSIS — M533 Sacrococcygeal disorders, not elsewhere classified: Secondary | ICD-10-CM | POA: Diagnosis not present

## 2023-05-12 DIAGNOSIS — Z471 Aftercare following joint replacement surgery: Secondary | ICD-10-CM | POA: Diagnosis not present

## 2023-05-12 DIAGNOSIS — R29898 Other symptoms and signs involving the musculoskeletal system: Secondary | ICD-10-CM | POA: Diagnosis not present

## 2023-05-12 DIAGNOSIS — M25652 Stiffness of left hip, not elsewhere classified: Secondary | ICD-10-CM | POA: Diagnosis not present

## 2023-05-12 DIAGNOSIS — M25552 Pain in left hip: Secondary | ICD-10-CM | POA: Diagnosis not present

## 2023-05-15 DIAGNOSIS — G4733 Obstructive sleep apnea (adult) (pediatric): Secondary | ICD-10-CM | POA: Diagnosis not present

## 2023-05-23 DIAGNOSIS — R300 Dysuria: Secondary | ICD-10-CM | POA: Diagnosis not present

## 2023-05-23 DIAGNOSIS — N390 Urinary tract infection, site not specified: Secondary | ICD-10-CM | POA: Diagnosis not present

## 2023-05-23 DIAGNOSIS — R319 Hematuria, unspecified: Secondary | ICD-10-CM | POA: Diagnosis not present

## 2023-05-29 DIAGNOSIS — I340 Nonrheumatic mitral (valve) insufficiency: Secondary | ICD-10-CM | POA: Diagnosis not present

## 2023-05-29 DIAGNOSIS — I361 Nonrheumatic tricuspid (valve) insufficiency: Secondary | ICD-10-CM | POA: Diagnosis not present

## 2023-05-29 DIAGNOSIS — I517 Cardiomegaly: Secondary | ICD-10-CM | POA: Diagnosis not present

## 2023-05-29 DIAGNOSIS — I272 Pulmonary hypertension, unspecified: Secondary | ICD-10-CM | POA: Diagnosis not present

## 2023-06-04 DIAGNOSIS — G4730 Sleep apnea, unspecified: Secondary | ICD-10-CM | POA: Diagnosis not present

## 2023-06-04 DIAGNOSIS — Z6835 Body mass index (BMI) 35.0-35.9, adult: Secondary | ICD-10-CM | POA: Diagnosis not present

## 2023-06-04 DIAGNOSIS — E118 Type 2 diabetes mellitus with unspecified complications: Secondary | ICD-10-CM | POA: Diagnosis not present

## 2023-06-04 DIAGNOSIS — I2510 Atherosclerotic heart disease of native coronary artery without angina pectoris: Secondary | ICD-10-CM | POA: Diagnosis not present

## 2023-06-23 DIAGNOSIS — D500 Iron deficiency anemia secondary to blood loss (chronic): Secondary | ICD-10-CM | POA: Diagnosis not present

## 2023-06-23 DIAGNOSIS — C61 Malignant neoplasm of prostate: Secondary | ICD-10-CM | POA: Diagnosis not present

## 2023-06-25 DIAGNOSIS — D500 Iron deficiency anemia secondary to blood loss (chronic): Secondary | ICD-10-CM | POA: Diagnosis not present

## 2023-06-30 DIAGNOSIS — N390 Urinary tract infection, site not specified: Secondary | ICD-10-CM | POA: Diagnosis not present

## 2023-07-03 DIAGNOSIS — E113292 Type 2 diabetes mellitus with mild nonproliferative diabetic retinopathy without macular edema, left eye: Secondary | ICD-10-CM | POA: Diagnosis not present

## 2023-07-03 DIAGNOSIS — H35341 Macular cyst, hole, or pseudohole, right eye: Secondary | ICD-10-CM | POA: Diagnosis not present

## 2023-07-03 DIAGNOSIS — H40003 Preglaucoma, unspecified, bilateral: Secondary | ICD-10-CM | POA: Diagnosis not present

## 2023-07-03 DIAGNOSIS — H43822 Vitreomacular adhesion, left eye: Secondary | ICD-10-CM | POA: Diagnosis not present

## 2023-07-03 DIAGNOSIS — H35371 Puckering of macula, right eye: Secondary | ICD-10-CM | POA: Diagnosis not present

## 2023-07-15 DIAGNOSIS — I350 Nonrheumatic aortic (valve) stenosis: Secondary | ICD-10-CM | POA: Diagnosis not present

## 2023-07-15 DIAGNOSIS — Z952 Presence of prosthetic heart valve: Secondary | ICD-10-CM | POA: Diagnosis not present

## 2023-07-27 DIAGNOSIS — R739 Hyperglycemia, unspecified: Secondary | ICD-10-CM | POA: Diagnosis not present

## 2023-07-27 DIAGNOSIS — Z79899 Other long term (current) drug therapy: Secondary | ICD-10-CM | POA: Diagnosis not present

## 2023-07-27 DIAGNOSIS — D649 Anemia, unspecified: Secondary | ICD-10-CM | POA: Diagnosis not present

## 2023-07-27 DIAGNOSIS — E559 Vitamin D deficiency, unspecified: Secondary | ICD-10-CM | POA: Diagnosis not present

## 2023-07-27 DIAGNOSIS — E782 Mixed hyperlipidemia: Secondary | ICD-10-CM | POA: Diagnosis not present

## 2023-07-30 DIAGNOSIS — G8929 Other chronic pain: Secondary | ICD-10-CM | POA: Diagnosis not present

## 2023-07-30 DIAGNOSIS — M25512 Pain in left shoulder: Secondary | ICD-10-CM | POA: Diagnosis not present

## 2023-08-03 DIAGNOSIS — G894 Chronic pain syndrome: Secondary | ICD-10-CM | POA: Diagnosis not present

## 2023-08-03 DIAGNOSIS — M533 Sacrococcygeal disorders, not elsewhere classified: Secondary | ICD-10-CM | POA: Diagnosis not present

## 2023-08-03 DIAGNOSIS — M1612 Unilateral primary osteoarthritis, left hip: Secondary | ICD-10-CM | POA: Diagnosis not present

## 2023-08-03 DIAGNOSIS — M549 Dorsalgia, unspecified: Secondary | ICD-10-CM | POA: Diagnosis not present

## 2023-08-15 DIAGNOSIS — G4733 Obstructive sleep apnea (adult) (pediatric): Secondary | ICD-10-CM | POA: Diagnosis not present

## 2023-09-29 DIAGNOSIS — Z6831 Body mass index (BMI) 31.0-31.9, adult: Secondary | ICD-10-CM | POA: Diagnosis not present

## 2023-09-29 DIAGNOSIS — E785 Hyperlipidemia, unspecified: Secondary | ICD-10-CM | POA: Diagnosis not present

## 2023-09-29 DIAGNOSIS — L304 Erythema intertrigo: Secondary | ICD-10-CM | POA: Diagnosis not present

## 2023-09-29 DIAGNOSIS — E1169 Type 2 diabetes mellitus with other specified complication: Secondary | ICD-10-CM | POA: Diagnosis not present

## 2023-10-14 DIAGNOSIS — H524 Presbyopia: Secondary | ICD-10-CM | POA: Diagnosis not present

## 2023-10-14 DIAGNOSIS — H52203 Unspecified astigmatism, bilateral: Secondary | ICD-10-CM | POA: Diagnosis not present

## 2023-10-14 DIAGNOSIS — H5213 Myopia, bilateral: Secondary | ICD-10-CM | POA: Diagnosis not present

## 2023-10-14 DIAGNOSIS — H4910 Fourth [trochlear] nerve palsy, unspecified eye: Secondary | ICD-10-CM | POA: Diagnosis not present

## 2023-10-21 DIAGNOSIS — I5189 Other ill-defined heart diseases: Secondary | ICD-10-CM | POA: Diagnosis not present

## 2023-10-21 DIAGNOSIS — Z952 Presence of prosthetic heart valve: Secondary | ICD-10-CM | POA: Diagnosis not present

## 2023-10-21 DIAGNOSIS — I340 Nonrheumatic mitral (valve) insufficiency: Secondary | ICD-10-CM | POA: Diagnosis not present

## 2023-10-21 DIAGNOSIS — I517 Cardiomegaly: Secondary | ICD-10-CM | POA: Diagnosis not present

## 2023-10-26 DIAGNOSIS — G894 Chronic pain syndrome: Secondary | ICD-10-CM | POA: Diagnosis not present

## 2023-10-26 DIAGNOSIS — M5417 Radiculopathy, lumbosacral region: Secondary | ICD-10-CM | POA: Diagnosis not present

## 2023-10-26 DIAGNOSIS — Z5181 Encounter for therapeutic drug level monitoring: Secondary | ICD-10-CM | POA: Diagnosis not present

## 2023-10-26 DIAGNOSIS — M1612 Unilateral primary osteoarthritis, left hip: Secondary | ICD-10-CM | POA: Diagnosis not present

## 2023-10-26 DIAGNOSIS — Z79899 Other long term (current) drug therapy: Secondary | ICD-10-CM | POA: Diagnosis not present

## 2023-10-26 DIAGNOSIS — M533 Sacrococcygeal disorders, not elsewhere classified: Secondary | ICD-10-CM | POA: Diagnosis not present

## 2023-11-12 DIAGNOSIS — D649 Anemia, unspecified: Secondary | ICD-10-CM | POA: Diagnosis not present

## 2023-11-12 DIAGNOSIS — I4891 Unspecified atrial fibrillation: Secondary | ICD-10-CM | POA: Diagnosis not present

## 2023-11-12 DIAGNOSIS — Z79899 Other long term (current) drug therapy: Secondary | ICD-10-CM | POA: Diagnosis not present

## 2023-11-12 DIAGNOSIS — E559 Vitamin D deficiency, unspecified: Secondary | ICD-10-CM | POA: Diagnosis not present

## 2023-11-12 DIAGNOSIS — R739 Hyperglycemia, unspecified: Secondary | ICD-10-CM | POA: Diagnosis not present

## 2023-11-12 DIAGNOSIS — E782 Mixed hyperlipidemia: Secondary | ICD-10-CM | POA: Diagnosis not present

## 2023-12-16 DIAGNOSIS — I350 Nonrheumatic aortic (valve) stenosis: Secondary | ICD-10-CM | POA: Diagnosis not present

## 2023-12-16 DIAGNOSIS — Z952 Presence of prosthetic heart valve: Secondary | ICD-10-CM | POA: Diagnosis not present
